# Patient Record
Sex: Female | Born: 1945 | Race: White | Hispanic: No | Smoking: Never smoker
Health system: Southern US, Community
[De-identification: ages and names within clinical notes are randomized; demographics above are authoritative.]

## PROBLEM LIST (undated history)

## (undated) DIAGNOSIS — K2971 Gastritis, unspecified, with bleeding: Secondary | ICD-10-CM

## (undated) DIAGNOSIS — M1990 Unspecified osteoarthritis, unspecified site: Secondary | ICD-10-CM

## (undated) DIAGNOSIS — M810 Age-related osteoporosis without current pathological fracture: Secondary | ICD-10-CM

## (undated) DIAGNOSIS — Z9049 Acquired absence of other specified parts of digestive tract: Secondary | ICD-10-CM

## (undated) DIAGNOSIS — G8929 Other chronic pain: Secondary | ICD-10-CM

## (undated) DIAGNOSIS — D8489 Other immunodeficiencies: Secondary | ICD-10-CM

## (undated) DIAGNOSIS — M064 Inflammatory polyarthropathy: Secondary | ICD-10-CM

## (undated) DIAGNOSIS — K219 Gastro-esophageal reflux disease without esophagitis: Secondary | ICD-10-CM

## (undated) DIAGNOSIS — G63 Polyneuropathy in diseases classified elsewhere: Secondary | ICD-10-CM

## (undated) DIAGNOSIS — I509 Heart failure, unspecified: Secondary | ICD-10-CM

## (undated) DIAGNOSIS — E119 Type 2 diabetes mellitus without complications: Secondary | ICD-10-CM

## (undated) DIAGNOSIS — M48062 Spinal stenosis, lumbar region with neurogenic claudication: Secondary | ICD-10-CM

## (undated) DIAGNOSIS — E1169 Type 2 diabetes mellitus with other specified complication: Secondary | ICD-10-CM

## (undated) DIAGNOSIS — E782 Mixed hyperlipidemia: Secondary | ICD-10-CM

## (undated) DIAGNOSIS — F32A Depression, unspecified: Secondary | ICD-10-CM

## (undated) DIAGNOSIS — Z933 Colostomy status: Secondary | ICD-10-CM

## (undated) DIAGNOSIS — D500 Iron deficiency anemia secondary to blood loss (chronic): Secondary | ICD-10-CM

## (undated) DIAGNOSIS — D649 Anemia, unspecified: Secondary | ICD-10-CM

## (undated) HISTORY — PX: CHOLECYSTECTOMY: SHX55

## (undated) HISTORY — PX: ABDOMINAL HYSTERECTOMY: SHX81

## (undated) HISTORY — PX: TUMOR REMOVAL: SHX12

---

## 2001-01-31 ENCOUNTER — Encounter: Admission: RE | Admit: 2001-01-31 | Discharge: 2001-01-31 | Payer: Self-pay | Admitting: Urology

## 2001-01-31 ENCOUNTER — Encounter: Payer: Self-pay | Admitting: Urology

## 2001-02-17 ENCOUNTER — Other Ambulatory Visit: Admission: RE | Admit: 2001-02-17 | Discharge: 2001-02-17 | Payer: Self-pay | Admitting: Obstetrics and Gynecology

## 2001-02-24 ENCOUNTER — Ambulatory Visit (HOSPITAL_COMMUNITY): Admission: RE | Admit: 2001-02-24 | Discharge: 2001-02-24 | Payer: Self-pay | Admitting: Obstetrics and Gynecology

## 2001-02-24 ENCOUNTER — Encounter: Payer: Self-pay | Admitting: Obstetrics and Gynecology

## 2001-03-15 ENCOUNTER — Inpatient Hospital Stay (HOSPITAL_COMMUNITY): Admission: RE | Admit: 2001-03-15 | Discharge: 2001-03-18 | Payer: Self-pay | Admitting: Urology

## 2002-05-29 ENCOUNTER — Other Ambulatory Visit: Admission: RE | Admit: 2002-05-29 | Discharge: 2002-05-29 | Payer: Self-pay | Admitting: Obstetrics and Gynecology

## 2004-08-13 ENCOUNTER — Other Ambulatory Visit: Admission: RE | Admit: 2004-08-13 | Discharge: 2004-08-13 | Payer: Self-pay | Admitting: Obstetrics and Gynecology

## 2009-11-09 ENCOUNTER — Ambulatory Visit: Payer: Self-pay | Admitting: Internal Medicine

## 2011-01-22 ENCOUNTER — Emergency Department: Payer: Self-pay | Admitting: Emergency Medicine

## 2011-05-04 ENCOUNTER — Ambulatory Visit: Payer: Self-pay | Admitting: Family Medicine

## 2011-05-25 ENCOUNTER — Emergency Department (HOSPITAL_COMMUNITY)
Admission: EM | Admit: 2011-05-25 | Discharge: 2011-05-25 | Disposition: A | Discharge status: Home / Self Care | Payer: Medicare Other | Attending: Emergency Medicine | Admitting: Emergency Medicine

## 2011-05-25 ENCOUNTER — Emergency Department (HOSPITAL_COMMUNITY): Payer: Medicare Other

## 2011-05-25 ENCOUNTER — Encounter: Payer: Self-pay | Admitting: Emergency Medicine

## 2011-05-25 DIAGNOSIS — M5430 Sciatica, unspecified side: Secondary | ICD-10-CM

## 2011-05-25 DIAGNOSIS — M79606 Pain in leg, unspecified: Secondary | ICD-10-CM

## 2011-05-25 DIAGNOSIS — R209 Unspecified disturbances of skin sensation: Secondary | ICD-10-CM | POA: Insufficient documentation

## 2011-05-25 DIAGNOSIS — M79609 Pain in unspecified limb: Secondary | ICD-10-CM | POA: Insufficient documentation

## 2011-05-25 DIAGNOSIS — M549 Dorsalgia, unspecified: Secondary | ICD-10-CM | POA: Insufficient documentation

## 2011-05-25 DIAGNOSIS — K219 Gastro-esophageal reflux disease without esophagitis: Secondary | ICD-10-CM | POA: Insufficient documentation

## 2011-05-25 DIAGNOSIS — M25559 Pain in unspecified hip: Secondary | ICD-10-CM | POA: Insufficient documentation

## 2011-05-25 DIAGNOSIS — R29898 Other symptoms and signs involving the musculoskeletal system: Secondary | ICD-10-CM | POA: Insufficient documentation

## 2011-05-25 DIAGNOSIS — I509 Heart failure, unspecified: Secondary | ICD-10-CM | POA: Insufficient documentation

## 2011-05-25 HISTORY — DX: Unspecified osteoarthritis, unspecified site: M19.90

## 2011-05-25 HISTORY — DX: Heart failure, unspecified: I50.9

## 2011-05-25 HISTORY — DX: Gastro-esophageal reflux disease without esophagitis: K21.9

## 2011-05-25 MED ORDER — OXYCODONE-ACETAMINOPHEN 5-325 MG PO TABS: 1.0000 | ORAL_TABLET | Freq: Four times a day (QID) | ORAL | Status: AC | PRN

## 2011-05-25 MED ORDER — OXYCODONE-ACETAMINOPHEN 5-325 MG PO TABS
1.0000 | ORAL_TABLET | Freq: Once | ORAL | Status: AC
  Administered 2011-05-25: 1 via ORAL
  Filled 2011-05-25: qty 1

## 2011-05-25 NOTE — ED Provider Notes (Signed)
History     CSN: [id number] Arrival date & time: 05/25/2011  2:37 PM   First MD Initiated Contact with Patient 05/25/11 1438      Chief Complaint  Patient presents with  . Hip Pain     HPI  65yoF pw LLE pain. She is complaining of intermittent pain in her left leg, currently 6/10. No relief with vicodin. She describes the pain as radiating from her back down to her knee. The pain is intermittently worse. Including worse with walking, sitting. She denies numbness,  Tingling, weakness of her leg. She is ambulatory. She states she did fall approximately 3 weeks ago 2 days prior to symptom onset. She was seen by her primary care Dr. negative x-rays of her lumbar spine. She presents today for worsening pain. She denies pain in her hip. She denies fever, chills. Denies abdominal pain, nausea, vomiting, diaphoresis, constipation, diarrhea.  Denies h/o malignancy, DM, immunocompromise  injection drug use, immunosuppression, indwelling urinary catheter, prolonged steroid use, skin or urinary tract infection. Denies saddle anesthesia, no urinary incontinence or retention.     Past Medical History  Diagnosis Date  . Arthritis   . CHF (congestive heart failure)   . GERD (gastroesophageal reflux disease)     History reviewed. No pertinent past surgical history.  History reviewed. No pertinent family history.  History  Substance Use Topics  . Smoking status: Not on file  . Smokeless tobacco: Not on file  . Alcohol Use:     OB History    Grav Para Term Preterm Abortions TAB SAB Ect Mult Living                  Review of Systems  All other systems reviewed and are negative.   except as noted HPI  Allergies  Levaquin  Home Medications   Current Outpatient Rx  Name Route Sig Dispense Refill  . CALCIUM 600 + D PO Oral Take 1 tablet by mouth 2 (two) times daily.      Marland Kitchen VITAMIN B-12 PO Oral Take 1 tablet by mouth daily.      . CYCLOBENZAPRINE HCL 5 MG PO TABS Oral Take 5 mg by  mouth daily.      . OMEGA-3 FATTY ACIDS 1000 MG PO CAPS Oral Take 2 g by mouth 2 (two) times daily.      Marland Kitchen LOVASTATIN 20 MG PO TABS Oral Take 20 mg by mouth at bedtime.      . MELOXICAM 15 MG PO TABS Oral Take 15 mg by mouth daily.      Marland Kitchen OMEPRAZOLE 20 MG PO CPDR Oral Take 20 mg by mouth daily.      Marland Kitchen OVER THE COUNTER MEDICATION Oral Take 1 tablet by mouth daily. Nutri-Calm vitamin     . SERTRALINE HCL 100 MG PO TABS Oral Take 100 mg by mouth daily.      . TORSEMIDE 20 MG PO TABS Oral Take 20 mg by mouth daily.      . OXYCODONE-ACETAMINOPHEN 5-325 MG PO TABS Oral Take 1 tablet by mouth every 6 (six) hours as needed for pain. 15 tablet 0    BP 110/59  Pulse 86  Temp(Src) 99.1 F (37.3 C) (Oral)  Resp 18  SpO2 95%  Physical Exam  Nursing note and vitals reviewed. Constitutional: She is oriented to person, place, and time. She appears well-developed.  HENT:  Head: Atraumatic.  Mouth/Throat: Oropharynx is clear and moist.  Eyes: Conjunctivae and EOM are normal. Pupils  are equal, round, and reactive to light.  Neck: Normal range of motion. Neck supple.  Cardiovascular: Normal rate, regular rhythm, normal heart sounds and intact distal pulses.   Pulmonary/Chest: Effort normal and breath sounds normal. No respiratory distress. She has no wheezes. She has no rales.  Abdominal: Soft. She exhibits no distension. There is no tenderness. There is no rebound and no guarding.  Musculoskeletal: Normal range of motion.       Lt leg raise positive no tenderness to palpation midline cervical thoracic or lumbar spine Strength 5/5 bilateral lower extr Minimal pain internal and external rotation left hip Sensation intact Refill less than 2 seconds  Neurological: She is alert and oriented to person, place, and time. She exhibits normal muscle tone.       No saddle anesthesia  Skin: Skin is warm and dry. No rash noted.  Psychiatric: She has a normal mood and affect.    ED Course  Procedures  (including critical care time)  Labs Reviewed - No data to display Dg Lumbar Spine Complete  05/25/2011  *RADIOLOGY REPORT*  Clinical Data: Fall.  LUMBAR SPINE - COMPLETE 4+ VIEW  Comparison: None.  Findings: There is mild rightward scoliosis.  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  Degenerative facet disease in the lower lumbar spine.  No fracture or acute subluxation.  SI joints are symmetric and unremarkable.  IMPRESSION: Scoliosis, spondylosis.  No acute findings.  Original Report Authenticated By: Cyndie Chime, M.D.   Dg Hip Complete Left  05/25/2011  *RADIOLOGY REPORT*  Clinical Data: Fall, left hip pain.  LEFT HIP - COMPLETE 2+ VIEW  Comparison: None  Findings: No acute bony abnormality.  Specifically, no fracture, subluxation, or dislocation.  Soft tissues are intact.  Hip joints and SI joints are symmetric and unremarkable.  IMPRESSION: No acute bony abnormality.  Original Report Authenticated By: Cyndie Chime, M.D.     1. Sciatica   2. Leg pain       MDM  Likely sciatica. neurovasc intact. XR lumbar spine with degenerative changes. X-ray of hip is unremarkable. She is invalid worry in emergency department. Will prescribe Vicodin as needed for breakthrough pain, patient to follow with her primary care doctor within the next one week if symptoms persist. Given strict precautions for return. Discussed with patient and husband. They are comfortable with discharge.        Forbes Cellar, MD 05/25/11 [phone]

## 2011-05-25 NOTE — ED Notes (Signed)
Pt ambulated in hallway, and reports that she would rather buy cane from store

## 2011-05-25 NOTE — ED Notes (Signed)
Pt to ED for eval of L hip pain; pt reports that about 3 weeks ago she hurt her L hip trying to feed dog; pt reports that the pain has not gone away and continues to get worse

## 2011-05-25 NOTE — ED Notes (Signed)
Ortho tech paged  

## 2011-05-25 NOTE — ED Notes (Signed)
Hip to knee hurting x several weeks went to see her dr x 3 weeks ago for same pain  Not any better denies dysuria pain feels deep

## 2012-03-15 ENCOUNTER — Ambulatory Visit: Payer: Self-pay | Admitting: Internal Medicine

## 2012-05-13 ENCOUNTER — Ambulatory Visit: Payer: Self-pay | Admitting: Unknown Physician Specialty

## 2012-06-08 HISTORY — PX: BREAST BIOPSY: SHX20

## 2012-11-25 ENCOUNTER — Other Ambulatory Visit: Payer: Self-pay | Admitting: Family Medicine

## 2013-03-09 ENCOUNTER — Ambulatory Visit: Payer: Self-pay | Admitting: Unknown Physician Specialty

## 2013-03-16 ENCOUNTER — Ambulatory Visit: Payer: Self-pay | Admitting: Internal Medicine

## 2013-03-28 ENCOUNTER — Ambulatory Visit: Payer: Self-pay | Admitting: Internal Medicine

## 2013-04-17 ENCOUNTER — Ambulatory Visit: Payer: Self-pay | Admitting: Surgery

## 2013-11-23 ENCOUNTER — Other Ambulatory Visit (HOSPITAL_COMMUNITY): Payer: Self-pay | Admitting: Internal Medicine

## 2013-11-23 ENCOUNTER — Telehealth (HOSPITAL_COMMUNITY): Payer: Self-pay | Admitting: *Deleted

## 2013-11-23 DIAGNOSIS — I739 Peripheral vascular disease, unspecified: Secondary | ICD-10-CM

## 2013-11-24 ENCOUNTER — Ambulatory Visit (HOSPITAL_COMMUNITY)
Admission: RE | Admit: 2013-11-24 | Discharge: 2013-11-24 | Disposition: A | Discharge status: Home / Self Care | Payer: Medicare Other | Source: Ambulatory Visit | Attending: Cardiovascular Disease | Admitting: Cardiovascular Disease

## 2013-11-24 DIAGNOSIS — I739 Peripheral vascular disease, unspecified: Secondary | ICD-10-CM | POA: Insufficient documentation

## 2013-11-24 DIAGNOSIS — I70219 Atherosclerosis of native arteries of extremities with intermittent claudication, unspecified extremity: Secondary | ICD-10-CM

## 2013-11-24 NOTE — Progress Notes (Signed)
Arterial Duplex Lower Ext. Completed. Rita Sturdivant, BS, RDMS, RVT  

## 2013-11-30 ENCOUNTER — Other Ambulatory Visit: Payer: Self-pay | Admitting: Physical Medicine and Rehabilitation

## 2013-11-30 DIAGNOSIS — IMO0002 Reserved for concepts with insufficient information to code with codable children: Secondary | ICD-10-CM

## 2013-11-30 DIAGNOSIS — M48062 Spinal stenosis, lumbar region with neurogenic claudication: Secondary | ICD-10-CM

## 2013-12-05 ENCOUNTER — Ambulatory Visit
Admission: RE | Admit: 2013-12-05 | Discharge: 2013-12-05 | Disposition: A | Discharge status: Home / Self Care | Payer: Medicare Other | Source: Ambulatory Visit | Attending: Physical Medicine and Rehabilitation | Admitting: Physical Medicine and Rehabilitation

## 2013-12-05 DIAGNOSIS — IMO0002 Reserved for concepts with insufficient information to code with codable children: Secondary | ICD-10-CM

## 2013-12-05 DIAGNOSIS — M48062 Spinal stenosis, lumbar region with neurogenic claudication: Secondary | ICD-10-CM

## 2013-12-19 ENCOUNTER — Ambulatory Visit: Payer: Medicare Other | Admitting: Cardiovascular Disease

## 2014-04-02 ENCOUNTER — Ambulatory Visit: Payer: Self-pay | Admitting: Internal Medicine

## 2015-03-29 ENCOUNTER — Other Ambulatory Visit: Payer: Self-pay | Admitting: Internal Medicine

## 2015-03-29 DIAGNOSIS — Z1231 Encounter for screening mammogram for malignant neoplasm of breast: Secondary | ICD-10-CM

## 2015-04-05 ENCOUNTER — Other Ambulatory Visit: Payer: Self-pay | Admitting: Internal Medicine

## 2015-04-05 ENCOUNTER — Ambulatory Visit
Admission: RE | Admit: 2015-04-05 | Discharge: 2015-04-05 | Disposition: A | Discharge status: Home / Self Care | Payer: Medicare Other | Source: Ambulatory Visit | Attending: Internal Medicine | Admitting: Internal Medicine

## 2015-04-05 DIAGNOSIS — Z1231 Encounter for screening mammogram for malignant neoplasm of breast: Secondary | ICD-10-CM

## 2015-09-30 ENCOUNTER — Encounter: Payer: Self-pay | Admitting: Emergency Medicine

## 2015-09-30 ENCOUNTER — Emergency Department
Admission: EM | Admit: 2015-09-30 | Discharge: 2015-09-30 | Disposition: A | Discharge status: Home / Self Care | Payer: Medicare Other | Attending: Emergency Medicine | Admitting: Emergency Medicine

## 2015-09-30 ENCOUNTER — Emergency Department: Payer: Medicare Other

## 2015-09-30 DIAGNOSIS — D649 Anemia, unspecified: Secondary | ICD-10-CM | POA: Insufficient documentation

## 2015-09-30 DIAGNOSIS — I509 Heart failure, unspecified: Secondary | ICD-10-CM | POA: Insufficient documentation

## 2015-09-30 DIAGNOSIS — Z79899 Other long term (current) drug therapy: Secondary | ICD-10-CM | POA: Insufficient documentation

## 2015-09-30 DIAGNOSIS — R531 Weakness: Secondary | ICD-10-CM | POA: Diagnosis not present

## 2015-09-30 DIAGNOSIS — M1990 Unspecified osteoarthritis, unspecified site: Secondary | ICD-10-CM | POA: Diagnosis not present

## 2015-09-30 LAB — URINALYSIS COMPLETE WITH MICROSCOPIC (ARMC ONLY)
BILIRUBIN URINE: NEGATIVE
Bacteria, UA: NONE SEEN
Glucose, UA: NEGATIVE mg/dL
Hgb urine dipstick: NEGATIVE
Ketones, ur: NEGATIVE mg/dL
LEUKOCYTES UA: NEGATIVE
NITRITE: NEGATIVE
Protein, ur: NEGATIVE mg/dL
SPECIFIC GRAVITY, URINE: 1.009 (ref 1.005–1.030)
pH: 5 (ref 5.0–8.0)

## 2015-09-30 LAB — COMPREHENSIVE METABOLIC PANEL WITH GFR
ALT: 12 U/L — ABNORMAL LOW (ref 14–54)
AST: 18 U/L (ref 15–41)
Albumin: 4.3 g/dL (ref 3.5–5.0)
Alkaline Phosphatase: 50 U/L (ref 38–126)
Anion gap: 15 (ref 5–15)
BUN: 21 mg/dL — ABNORMAL HIGH (ref 6–20)
CO2: 25 mmol/L (ref 22–32)
Calcium: 9.6 mg/dL (ref 8.9–10.3)
Chloride: 100 mmol/L — ABNORMAL LOW (ref 101–111)
Creatinine, Ser: 1.06 mg/dL — ABNORMAL HIGH (ref 0.44–1.00)
GFR calc Af Amer: 59 mL/min — ABNORMAL LOW
GFR calc non Af Amer: 51 mL/min — ABNORMAL LOW
Glucose, Bld: 105 mg/dL — ABNORMAL HIGH (ref 65–99)
Potassium: 3.2 mmol/L — ABNORMAL LOW (ref 3.5–5.1)
Sodium: 140 mmol/L (ref 135–145)
Total Bilirubin: 0.3 mg/dL (ref 0.3–1.2)
Total Protein: 7.8 g/dL (ref 6.5–8.1)

## 2015-09-30 LAB — CBC
HCT: 26.1 % — ABNORMAL LOW (ref 35.0–47.0)
Hemoglobin: 7.9 g/dL — ABNORMAL LOW (ref 12.0–16.0)
MCH: 24.2 pg — ABNORMAL LOW (ref 26.0–34.0)
MCHC: 30.1 g/dL — ABNORMAL LOW (ref 32.0–36.0)
MCV: 80.2 fL (ref 80.0–100.0)
Platelets: 423 K/uL (ref 150–440)
RBC: 3.26 MIL/uL — ABNORMAL LOW (ref 3.80–5.20)
RDW: 20.1 % — ABNORMAL HIGH (ref 11.5–14.5)
WBC: 9.3 K/uL (ref 3.6–11.0)

## 2015-09-30 LAB — TYPE AND SCREEN
ABO/RH(D): A POS
ANTIBODY SCREEN: NEGATIVE

## 2015-09-30 LAB — TROPONIN I

## 2015-09-30 LAB — ABO/RH: ABO/RH(D): A POS

## 2015-09-30 LAB — LIPASE, BLOOD: LIPASE: 25 U/L (ref 11–51)

## 2015-09-30 LAB — TSH: TSH: 5.871 u[IU]/mL — ABNORMAL HIGH (ref 0.350–4.500)

## 2015-09-30 NOTE — ED Provider Notes (Signed)
[facility] Emergency Department Provider Note  ____________________________________________  Time seen: Approximately230 PM  I have reviewed the triage vital signs and the nursing notes.   HISTORY  Chief Complaint Abnormal Lab   HPI Vanessa Cisneros is a 70 y.o. female with a history of CHF as well as anemia who is presenting to the emergency department with weakness as well as intermittent upper abdominal pain. She said that she has been anemic lately with a hemoglobin down to 6.5 several weeks ago. She has been double on her iron dose and is now feeling increasingly weak with nausea chills and mild headaches. She says that she also has dyspnea upon exertion. She is also complaining of upper abdominal cramping which comes and goes after she had a vomiting episode Easter services. She said that the [employer] services vomitus appeared as "old blood." She has not had any episodes of vomiting since. She denies any chest pain. Says that she does have dark stool but says this is chronic and unchanged from her iron.   Past Medical History  Diagnosis Date  . Arthritis   . CHF (congestive heart failure) (HCC)   . GERD (gastroesophageal reflux disease)     There are no active problems to display for this patient.   Past Surgical History  Procedure Laterality Date  . Breast biopsy Left 2014    neg  . Abdominal hysterectomy    Cholecystectomy  Current Outpatient Rx  Name  Route  Sig  Dispense  Refill  . Calcium Carbonate-Vitamin D (CALCIUM 600 + D PO)   Oral   Take 1 tablet by mouth 2 (two) times daily.           . Cyanocobalamin (VITAMIN B-12 PO)   Oral   Take 1 tablet by mouth daily.           . cyclobenzaprine (FLEXERIL) 5 MG tablet   Oral   Take 5 mg by mouth daily.           . fish oil-omega-3 fatty acids 1000 MG capsule   Oral   Take 2 g by mouth 2 (two) times daily.           Marland Kitchen lovastatin (MEVACOR) 20 MG tablet   Oral   Take 20 mg by  mouth at bedtime.           . meloxicam (MOBIC) 15 MG tablet   Oral   Take 15 mg by mouth daily.           Marland Kitchen omeprazole (PRILOSEC) 20 MG capsule   Oral   Take 20 mg by mouth daily.           Marland Kitchen OVER THE COUNTER MEDICATION   Oral   Take 1 tablet by mouth daily. Nutri-Calm vitamin          . sertraline (ZOLOFT) 100 MG tablet   Oral   Take 100 mg by mouth daily.           Marland Kitchen torsemide (DEMADEX) 20 MG tablet   Oral   Take 20 mg by mouth daily.             Allergies Levofloxacin  History reviewed. No pertinent family history.  Social History Social History  Substance Use Topics  . Smoking status: Never Smoker   . Smokeless tobacco: None  . Alcohol Use: No    Review of Systems Constitutional: No fever/chills Eyes: No visual changes. ENT: No sore throat. Cardiovascular: Denies chest  pain. Respiratory:As above Gastrointestinal:  no vomiting.  No diarrhea.  No constipation. Genitourinary: Negative for dysuria. Musculoskeletal: Negative for back pain. Skin: Negative for rash. Neurological: Negative for focal weakness or numbness. Denying any headache at this time.  10-point ROS otherwise negative.  ____________________________________________   PHYSICAL EXAM:  VITAL SIGNS: ED Triage Vitals  Enc Vitals Group     BP 09/30/15 1059 147/60 mmHg     Pulse Rate 09/30/15 1059 83     Resp 09/30/15 1059 18     Temp 09/30/15 1059 98.4 F (36.9 C)     Temp Source 09/30/15 1059 Oral     SpO2 09/30/15 1059 100 %     Weight 09/30/15 1059 105 lb (47.628 kg)     Height 09/30/15 1059  (1.549 m)     Head Cir --      Peak Flow --      Pain Score 09/30/15 1100 1     Pain Loc --      Pain Edu? --      Excl. in GC? --     Constitutional: Alert and oriented. Well appearing and in no acute distress. Eyes: Conjunctivae are normal. PERRL. EOMI. Head: Atraumatic. Nose: No congestion/rhinnorhea. Mouth/Throat: Mucous membranes are moist.   Neck: No stridor.    Cardiovascular: Normal rate, regular rhythm. Grossly normal heart sounds.  Good peripheral circulation. Respiratory: Normal respiratory effort.  No retractions. Lungs CTAB. Gastrointestinal: Soft with mild upper abdominal tenderness palpation. No rebound or guarding. Negative Murphy sign.. No distention. No abdominal bruits. No CVA tenderness. Rectal exam with green stool which is weakly heme positive. Musculoskeletal: No lower extremity tenderness nor edema.  No joint effusions. Neurologic:  Normal speech and language. No gross focal neurologic deficits are appreciated. No gait instability. Skin:  Skin is warm, dry and intact. No rash noted. Psychiatric: Mood and affect are normal. Speech and behavior are normal.  ____________________________________________   LABS (all labs ordered are listed, but only abnormal results are displayed)  Labs Reviewed  COMPREHENSIVE METABOLIC PANEL - Abnormal; Notable for the following:    Potassium 3.2 (*)    Chloride 100 (*)    Glucose, Bld 105 (*)    BUN 21 (*)    Creatinine, Ser 1.06 (*)    ALT 12 (*)    GFR calc non Af Amer 51 (*)    GFR calc Af Amer 59 (*)    All other components within normal limits  CBC - Abnormal; Notable for the following:    RBC 3.26 (*)    Hemoglobin 7.9 (*)    HCT 26.1 (*)    MCH 24.2 (*)    MCHC 30.1 (*)    RDW 20.1 (*)    All other components within normal limits  URINALYSIS COMPLETEWITH MICROSCOPIC ([facility] ONLY) - Abnormal; Notable for the following:    Color, Urine YELLOW (*)    APPearance CLEAR (*)    Squamous Epithelial / LPF 0-5 (*)    All other components within normal limits  TSH - Abnormal; Notable for the following:    TSH 5.871 (*)    All other components within normal limits  LIPASE, BLOOD  TROPONIN I  TYPE AND SCREEN  ABO/RH   ____________________________________________  EKG  ED ECG REPORT I, Arelia Longest, the attending physician, personally viewed and interpreted this ECG.    Date: 09/30/2015  EKG Time: 1501  Rate: Normal sinus  Rhythm: normal sinus rhythm  Axis: Normal  Intervals:none  ST&T Change: No ST segment elevation or depression. No abnormal T-wave inversion.   ____________________________________________  RADIOLOGY  DG Chest 2 View (Final result) Result time: 09/30/15 14:37:25   Final result by Rad Results In Interface (09/30/15 14:37:25)   Narrative:   CLINICAL DATA: Weakness starting Sunday  EXAM: CHEST 2 VIEW  COMPARISON: Report 02/24/2001 no images available  FINDINGS: Cardiomediastinal silhouette is unremarkable. No acute infiltrate or pleural effusion. No pulmonary edema. Mild left basilar atelectasis or scarring. Moderate size hiatal hernia measures 7.5 cm.  IMPRESSION: No active disease. Moderate size hiatal hernia. Mild left basilar atelectasis or scarring.   Electronically Signed By: Natasha MeadLiviu Pop M.D. On: 09/30/2015 14:37        ____________________________________________   PROCEDURES    ____________________________________________   INITIAL IMPRESSION / ASSESSMENT AND PLAN / ED COURSE  Pertinent labs & imaging results that were available during my care of the patient were reviewed by me and considered in my medical decision making (see chart for details).  ----------------------------------------- 4:13 PM on 09/30/2015 -----------------------------------------  Hemoglobin has increased to 7.9 today from 6.5. Does not appear to have any active source of bleeding. I would say that her rectal exam is more consistent with the patient's iron usage and acute GI bleed. She has not had any further episodes of nausea and vomiting and she says she is "on an acid reducer." I discussed with her following up with Dr. Hyacinth MeekerMiller in the office for repeat hemoglobin in 1-2 days and she is understanding of this plan and willing to comply. He was also able to ambulate with a normal gait without any assistance although  she says that she feels weak all over when she is getting up to walk. ____________________________________________   FINAL CLINICAL IMPRESSION(S) / ED DIAGNOSES  Weakness. Anemia.    Myrna Blazeravid Matthew Schaevitz, MD 09/30/15 [phone]

## 2015-09-30 NOTE — Discharge Instructions (Signed)
Anemia, Nonspecific Anemia is a condition in which the concentration of red blood cells or hemoglobin in the blood is below normal. Hemoglobin is a substance in red blood cells that carries oxygen to the tissues of the body. Anemia results in not enough oxygen reaching these tissues.  CAUSES  Common causes of anemia include:   Excessive bleeding. Bleeding may be internal or external. This includes excessive bleeding from periods (in women) or from the intestine.   Poor nutrition.   Chronic kidney, thyroid, and liver disease.  Bone marrow disorders that decrease red blood cell production.  Cancer and treatments for cancer.  HIV, AIDS, and their treatments.  Spleen problems that increase red blood cell destruction.  Blood disorders.  Excess destruction of red blood cells due to infection, medicines, and autoimmune disorders. SIGNS AND SYMPTOMS   Minor weakness.   Dizziness.   Headache.  Palpitations.   Shortness of breath, especially with exercise.   Paleness.  Cold sensitivity.  Indigestion.  Nausea.  Difficulty sleeping.  Difficulty concentrating. Symptoms may occur suddenly or they may develop slowly.  DIAGNOSIS  Additional blood tests are often needed. These help your health care provider determine the best treatment. Your health care provider will check your stool for blood and look for other causes of blood loss.  TREATMENT  Treatment varies depending on the cause of the anemia. Treatment can include:   Supplements of iron, vitamin B12, or folic acid.   Hormone medicines.   A blood transfusion. This may be needed if blood loss is severe.   Hospitalization. This may be needed if there is significant continual blood loss.   Dietary changes.  Spleen removal. HOME CARE INSTRUCTIONS Keep all follow-up appointments. It often takes many weeks to correct anemia, and having your health care provider check on your condition and your response to  treatment is very important. SEEK IMMEDIATE MEDICAL CARE IF:   You develop extreme weakness, shortness of breath, or chest pain.   You become dizzy or have trouble concentrating.  You develop heavy vaginal bleeding.   You develop a rash.   You have bloody or black, tarry stools.   You faint.   You vomit up blood.   You vomit repeatedly.   You have abdominal pain.  You have a fever or persistent symptoms for more than 2-3 days.   You have a fever and your symptoms suddenly get worse.   You are dehydrated.  MAKE SURE YOU:  Understand these instructions.  Will watch your condition.  Will get help right away if you are not doing well or get worse.   This information is not intended to replace advice given to you by your health care provider. Make sure you discuss any questions you have with your health care provider.   Document Released: 07/02/2004 Document Revised: 01/25/2013 Document Reviewed: 11/18/2012 Elsevier Interactive Patient Education 2016 [employer]  

## 2015-09-30 NOTE — ED Notes (Addendum)
Pt to Xray.

## 2015-09-30 NOTE — ED Notes (Signed)
Pt from home with weakness, nausea, chills, headache, dyspnea upon exertion. Pt was seen last week in Dr. Rondel BatonMiller's office and told her hemoglobin was 6.5. States she wanted to be seen again today but they didn't call her back until she was already here. Pt alert & oriented with NAD noted.

## 2015-10-11 ENCOUNTER — Encounter: Payer: Self-pay | Admitting: *Deleted

## 2015-10-14 ENCOUNTER — Ambulatory Visit: Payer: Medicare Other | Admitting: Anesthesiology

## 2015-10-14 ENCOUNTER — Encounter
Admission: RE | Disposition: A | Discharge status: Home / Self Care | Payer: Self-pay | Source: Ambulatory Visit | Attending: Unknown Physician Specialty

## 2015-10-14 ENCOUNTER — Ambulatory Visit
Admission: RE | Admit: 2015-10-14 | Discharge: 2015-10-14 | Disposition: A | Discharge status: Home / Self Care | Payer: Medicare Other | Source: Ambulatory Visit | Attending: Unknown Physician Specialty | Admitting: Unknown Physician Specialty

## 2015-10-14 ENCOUNTER — Encounter: Payer: Self-pay | Admitting: *Deleted

## 2015-10-14 DIAGNOSIS — K219 Gastro-esophageal reflux disease without esophagitis: Secondary | ICD-10-CM | POA: Diagnosis not present

## 2015-10-14 DIAGNOSIS — I509 Heart failure, unspecified: Secondary | ICD-10-CM | POA: Insufficient documentation

## 2015-10-14 DIAGNOSIS — Z79899 Other long term (current) drug therapy: Secondary | ICD-10-CM | POA: Diagnosis not present

## 2015-10-14 DIAGNOSIS — D509 Iron deficiency anemia, unspecified: Secondary | ICD-10-CM | POA: Insufficient documentation

## 2015-10-14 DIAGNOSIS — M1991 Primary osteoarthritis, unspecified site: Secondary | ICD-10-CM | POA: Insufficient documentation

## 2015-10-14 DIAGNOSIS — K319 Disease of stomach and duodenum, unspecified: Secondary | ICD-10-CM | POA: Insufficient documentation

## 2015-10-14 HISTORY — PX: ESOPHAGOGASTRODUODENOSCOPY (EGD) WITH PROPOFOL: SHX5813

## 2015-10-14 SURGERY — ESOPHAGOGASTRODUODENOSCOPY (EGD) WITH PROPOFOL
Anesthesia: General

## 2015-10-14 MED ORDER — PROPOFOL 500 MG/50ML IV EMUL
INTRAVENOUS | Status: DC | PRN
  Administered 2015-10-14: 100 ug/kg/min via INTRAVENOUS

## 2015-10-14 MED ORDER — LIDOCAINE HCL (CARDIAC) 20 MG/ML IV SOLN
INTRAVENOUS | Status: DC | PRN
  Administered 2015-10-14: 30 mg via INTRATRACHEAL

## 2015-10-14 MED ORDER — SODIUM CHLORIDE 0.9 % IV SOLN: INTRAVENOUS | Status: DC

## 2015-10-14 MED ORDER — PROPOFOL 10 MG/ML IV BOLUS
INTRAVENOUS | Status: DC | PRN
  Administered 2015-10-14: 20 mg via INTRAVENOUS
  Administered 2015-10-14: 10 mg via INTRAVENOUS

## 2015-10-14 MED ORDER — SODIUM CHLORIDE 0.9 % IV SOLN
INTRAVENOUS | Status: DC
  Administered 2015-10-14: 1000 mL via INTRAVENOUS

## 2015-10-14 NOTE — H&P (Signed)
Primary Care Physician:  Danella PentonMark F Miller, MD Primary Gastroenterologist:  Dr. Mechele CollinElliott  Pre-Procedure History & Physical: HPI:  Vanessa Cisneros is a 70 y.o. female is here for an endoscopy.   Past Medical History  Diagnosis Date  . Arthritis   . CHF (congestive heart failure) (HCC)   . GERD (gastroesophageal reflux disease)     Past Surgical History  Procedure Laterality Date  . Breast biopsy Left 2014    neg  . Abdominal hysterectomy    . Tumor removal N/A     stomach    Prior to Admission medications   Medication Sig Start Date End Date Taking? Authorizing Provider  Calcium Carbonate-Vitamin D (CALCIUM 600 + D PO) Take 1 tablet by mouth 2 (two) times daily.     Yes Historical Provider, MD  fish oil-omega-3 fatty acids 1000 MG capsule Take 2 g by mouth 2 (two) times daily.     Yes Historical Provider, MD  lovastatin (MEVACOR) 20 MG tablet Take 20 mg by mouth at bedtime.     Yes Historical Provider, MD  omeprazole (PRILOSEC) 20 MG capsule Take 20 mg by mouth daily.     Yes Historical Provider, MD  sertraline (ZOLOFT) 100 MG tablet Take 100 mg by mouth daily.     Yes Historical Provider, MD  torsemide (DEMADEX) 20 MG tablet Take 20 mg by mouth daily.     Yes Historical Provider, MD  Cyanocobalamin (VITAMIN B-12 PO) Take 1 tablet by mouth daily. Reported on 10/14/2015    Historical Provider, MD  cyclobenzaprine (FLEXERIL) 5 MG tablet Take 5 mg by mouth daily. Reported on 10/14/2015    Historical Provider, MD  meloxicam (MOBIC) 15 MG tablet Take 15 mg by mouth daily. Reported on 10/14/2015    Historical Provider, MD  OVER THE COUNTER MEDICATION Take 1 tablet by mouth daily. Nutri-Calm vitamin     Historical Provider, MD    Allergies as of 10/11/2015 - Review Complete 10/11/2015  Allergen Reaction Noted  . Levofloxacin  05/25/2011    History reviewed. No pertinent family history.  Social History   Social History  . Marital Status: Married    Spouse Name: N/A  . Number of  Children: N/A  . Years of Education: N/A   Occupational History  . Not on file.   Social History Main Topics  . Smoking status: Never Smoker   . Smokeless tobacco: Not on file  . Alcohol Use: No  . Drug Use: Not on file  . Sexual Activity: Not on file   Other Topics Concern  . Not on file   Social History Narrative    Review of Systems: See HPI, otherwise negative ROS  Physical Exam: BP 121/42 mmHg  Pulse 75  Temp(Src) 97.7 F (36.5 C) (Tympanic)  Resp 16  Ht 5\' 1"  (1.549 m)  Wt 47.628 kg (105 lb)  BMI 19.85 kg/m2  SpO2 99% General:   Alert,  pleasant and cooperative in NAD Head:  Normocephalic and atraumatic. Neck:  Supple; no masses or thyromegaly. Lungs:  Clear throughout to auscultation.    Heart:  Regular rate and rhythm. Abdomen:  Soft, nontender and nondistended. Normal bowel sounds, without guarding, and without rebound.   Neurologic:  Alert and  oriented x4;  grossly normal neurologically.  Impression/Plan: Vanessa NorthJoann Moore Barbour is here for an endoscopy to be performed for anemia, iron def.  Risks, benefits, limitations, and alternatives regarding  endoscopy have been reviewed with the patient.  Questions have  been answered.  All parties agreeable.   Lynnae Prude, MD  10/14/2015, 3:49 PM

## 2015-10-14 NOTE — Anesthesia Procedure Notes (Signed)
Date/Time: 10/14/2015 3:58 PM Performed by: Ginger CarneMICHELET, Minnie Shi Pre-anesthesia Checklist: Patient identified, Emergency Drugs available, Suction available, Patient being monitored and Timeout performed Patient Re-evaluated:Patient Re-evaluated prior to inductionOxygen Delivery Method: Simple face mask

## 2015-10-14 NOTE — Anesthesia Preprocedure Evaluation (Signed)
Anesthesia Evaluation  Patient identified by MRN, date of birth, ID band Patient awake    Reviewed: Allergy & Precautions, NPO status , Patient's Chart, lab work & pertinent test results  Airway Mallampati: I       Dental  (+) Teeth Intact   Pulmonary neg pulmonary ROS,    breath sounds clear to auscultation       Cardiovascular negative cardio ROS   Rhythm:Regular Rate:Normal     Neuro/Psych negative neurological ROS     GI/Hepatic Neg liver ROS, GERD  Medicated,  Endo/Other  negative endocrine ROS  Renal/GU      Musculoskeletal negative musculoskeletal ROS (+)   Abdominal Normal abdominal exam  (+)   Peds negative pediatric ROS (+)  Hematology negative hematology ROS (+)   Anesthesia Other Findings   Reproductive/Obstetrics                             Anesthesia Physical Anesthesia Plan  ASA: I  Anesthesia Plan: General   Post-op Pain Management:    Induction: Intravenous  Airway Management Planned: Natural Airway and Nasal Cannula  Additional Equipment:   Intra-op Plan:   Post-operative Plan: Extubation in OR  Informed Consent: I have reviewed the patients History and Physical, chart, labs and discussed the procedure including the risks, benefits and alternatives for the proposed anesthesia with the patient or authorized representative who has indicated his/her understanding and acceptance.     Plan Discussed with: CRNA  Anesthesia Plan Comments:         Anesthesia Quick Evaluation

## 2015-10-14 NOTE — Anesthesia Postprocedure Evaluation (Signed)
Anesthesia Post Note  Patient: Vanessa Cisneros  Procedure(s) Performed: Procedure(s) (LRB): ESOPHAGOGASTRODUODENOSCOPY (EGD) WITH PROPOFOL (N/A)  Patient location during evaluation: PACU Anesthesia Type: General Level of consciousness: awake Pain management: pain level controlled Vital Signs Assessment: post-procedure vital signs reviewed and stable Respiratory status: spontaneous breathing Cardiovascular status: blood pressure returned to baseline Postop Assessment: no headache Anesthetic complications: no    Last Vitals:  Filed Vitals:   10/14/15 1640 10/14/15 1649  BP: 122/57 126/52  Pulse: 76 72  Temp:    Resp: 19 29    Last Pain:  Filed Vitals:   10/14/15 1654  PainSc: 3                  VAN STAVEREN,Darielys Giglia

## 2015-10-14 NOTE — Transfer of Care (Signed)
Immediate Anesthesia Transfer of Care Note  Patient: Vanessa NorthJoann Moore Deacon  Procedure(s) Performed: Procedure(s): ESOPHAGOGASTRODUODENOSCOPY (EGD) WITH PROPOFOL (N/A)  Patient Location: PACU  Anesthesia Type:General  Level of Consciousness: sedated  Airway & Oxygen Therapy: Patient Spontanous Breathing  Post-op Assessment: Report given to RN  Post vital signs: Reviewed  Last Vitals:  Filed Vitals:   10/14/15 1508 10/14/15 1609  BP: 121/42 100/40  Pulse: 75 75  Temp: 36.5 C 35.9 C  Resp: 16 19    Last Pain:  Filed Vitals:   10/14/15 1609  PainSc: 3          Complications: No apparent anesthesia complications

## 2015-10-14 NOTE — Op Note (Signed)
[facility] Gastroenterology Patient Name: Vanessa Cisneros Procedure Date: 10/14/2015 3:47 PM MRN: [id number] Account #: [id number] Date of Birth: 05/22/1946 Admit Type: Outpatient Age: 70 Room: [facility] ENDO ROOM 1 Gender: Female Note Status: Finalized Procedure:            Upper GI endoscopy Indications:          Iron deficiency anemia Providers:            Scot Jun, MD Referring MD:         Danella Penton, MD (Referring MD) Medicines:            Propofol per Anesthesia Complications:        No immediate complications. Procedure:            Pre-Anesthesia Assessment:                       - After reviewing the risks and benefits, the patient                        was deemed in satisfactory condition to undergo the                        procedure.                       After obtaining informed consent, the endoscope was                        passed under direct vision. Throughout the procedure,                        the patient's blood pressure, pulse, and oxygen                        saturations were monitored continuously. The Endoscope                        was introduced through the mouth, and advanced to the                        second part of duodenum. The upper GI endoscopy was                        accomplished without difficulty. The patient tolerated                        the procedure well. Findings:      The examined esophagus was normal.      Localized mildly erythematous mucosa without bleeding was found in the       gastric antrum. Biopsies were taken with a cold forceps for histology.       Biopsies were taken with a cold forceps for Helicobacter pylori testing.      Diffuse granular mucosa was found in the gastric body. Biopsies were       taken with a cold forceps for histology.      The examined duodenum was normal. Impression:           - Normal esophagus.                       - Erythematous mucosa in  the antrum. Biopsied.                    - Granular gastric mucosa. Biopsied.                       - Normal examined duodenum. Recommendation:       - Await pathology results. Scot Junobert T Taneisha Fuson, MD 10/14/2015 4:58:13 PM This report has been signed electronically. Number of Addenda: 0 Note Initiated On: 10/14/2015 3:47 PM      [facility]

## 2015-10-15 ENCOUNTER — Encounter: Payer: Self-pay | Admitting: Unknown Physician Specialty

## 2015-10-16 LAB — SURGICAL PATHOLOGY

## 2019-04-10 ENCOUNTER — Other Ambulatory Visit: Payer: Self-pay

## 2019-04-10 DIAGNOSIS — Z20822 Contact with and (suspected) exposure to covid-19: Secondary | ICD-10-CM

## 2019-04-12 ENCOUNTER — Telehealth: Payer: Self-pay | Admitting: General Practice

## 2019-04-12 LAB — NOVEL CORONAVIRUS, NAA: SARS-CoV-2, NAA: NOT DETECTED

## 2019-04-12 NOTE — Telephone Encounter (Signed)
Negative COVID results given. Patient results "NOT Detected." Caller expressed understanding. ° °

## 2019-05-15 ENCOUNTER — Other Ambulatory Visit: Payer: Self-pay

## 2019-05-15 DIAGNOSIS — Z20822 Contact with and (suspected) exposure to covid-19: Secondary | ICD-10-CM

## 2019-05-17 LAB — NOVEL CORONAVIRUS, NAA: SARS-CoV-2, NAA: NOT DETECTED

## 2019-05-18 ENCOUNTER — Telehealth: Payer: Self-pay

## 2019-05-18 NOTE — Telephone Encounter (Signed)
Caller given negative result and verbalized understanding  

## 2019-07-25 ENCOUNTER — Other Ambulatory Visit: Payer: Self-pay | Admitting: Internal Medicine

## 2019-07-25 DIAGNOSIS — M5116 Intervertebral disc disorders with radiculopathy, lumbar region: Secondary | ICD-10-CM

## 2019-08-04 ENCOUNTER — Ambulatory Visit
Admission: RE | Admit: 2019-08-04 | Discharge: 2019-08-04 | Disposition: A | Discharge status: Home / Self Care | Payer: Medicare Other | Source: Ambulatory Visit | Attending: Internal Medicine | Admitting: Internal Medicine

## 2019-08-04 ENCOUNTER — Other Ambulatory Visit: Payer: Self-pay

## 2019-08-04 DIAGNOSIS — M5116 Intervertebral disc disorders with radiculopathy, lumbar region: Secondary | ICD-10-CM | POA: Insufficient documentation

## 2019-10-24 ENCOUNTER — Ambulatory Visit
Admission: RE | Admit: 2019-10-24 | Discharge: 2019-10-24 | Disposition: A | Discharge status: Home / Self Care | Payer: Medicare Other | Source: Ambulatory Visit | Attending: Neurosurgery | Admitting: Neurosurgery

## 2019-10-24 ENCOUNTER — Other Ambulatory Visit: Payer: Self-pay | Admitting: Neurosurgery

## 2019-10-24 DIAGNOSIS — M48061 Spinal stenosis, lumbar region without neurogenic claudication: Secondary | ICD-10-CM | POA: Diagnosis not present

## 2019-12-20 ENCOUNTER — Ambulatory Visit: Payer: Medicare Other | Admitting: Pain Medicine

## 2020-08-26 ENCOUNTER — Other Ambulatory Visit: Admission: RE | Admit: 2020-08-26 | Payer: Medicare Other | Source: Ambulatory Visit

## 2020-08-28 ENCOUNTER — Ambulatory Visit: Admission: RE | Admit: 2020-08-28 | Payer: Medicare Other | Source: Home / Self Care | Admitting: Internal Medicine

## 2020-08-28 ENCOUNTER — Encounter: Admission: RE | Payer: Self-pay | Source: Home / Self Care

## 2020-08-28 SURGERY — ESOPHAGOGASTRODUODENOSCOPY (EGD) WITH PROPOFOL
Anesthesia: General

## 2021-12-24 IMAGING — MR MR LUMBAR SPINE W/O CM
4 of 5 series · 33 of 48 positions shown · non-contrast
Comparison: MRI of the lumbar spine December 05, 2013.

CLINICAL DATA: Low back pain radiating to both legs.

EXAM:
MRI LUMBAR SPINE WITHOUT CONTRAST
TECHNIQUE: Multiplanar, multisequence MR imaging of the lumbar spine was
performed. No intravenous contrast was administered.

[Series 6: T2 · sagittal · 4.0mm · 0.81mm/px · 8 of 17 slices shown (1 of 2)]
[im 1/17]
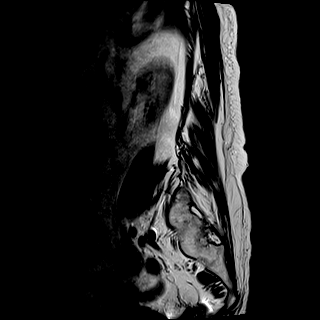
[im 3/17]
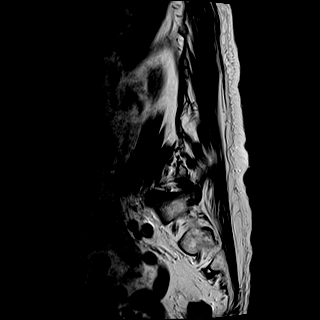
[im 5/17]
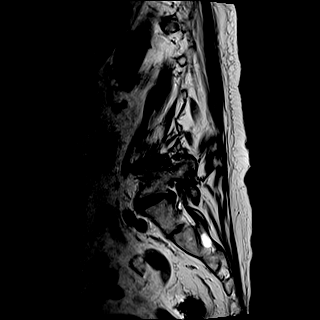
[im 7/17]
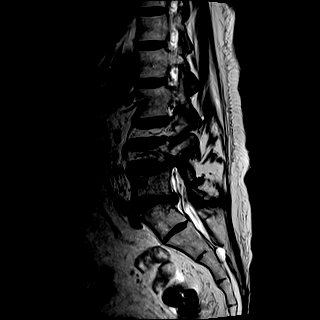
[im 10/17]
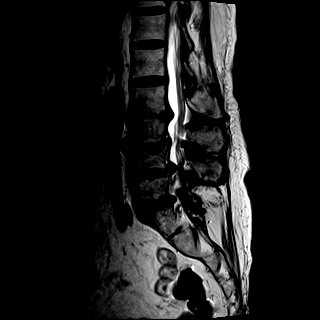
[im 12/17]
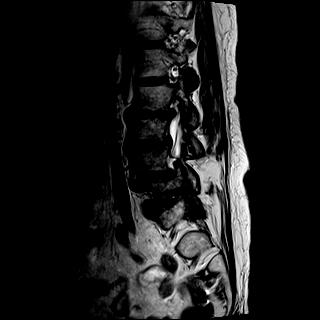
[im 14/17]
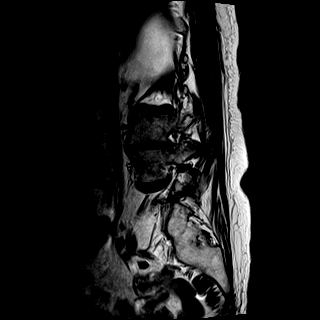
[im 17/17]
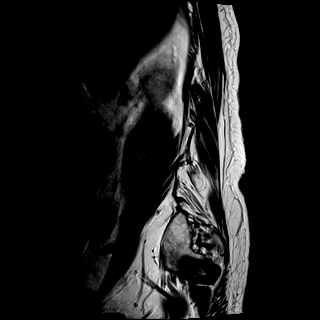

[Series 7: T1 · sagittal · 4.0mm · 0.81mm/px · 7 of 17 slices shown (1 of 2)]
[im 1/17]
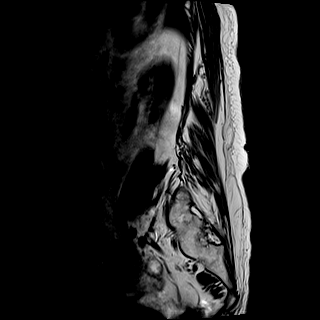
[im 3/17]
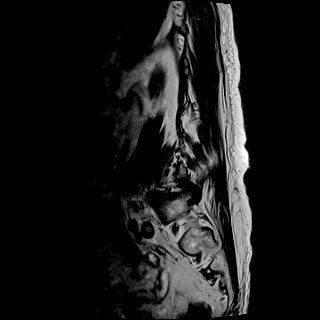
[im 6/17]
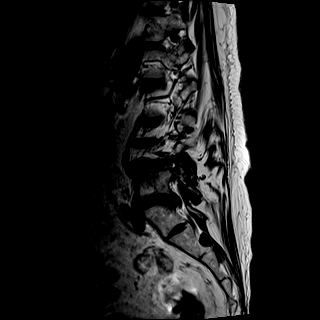
[im 9/17]
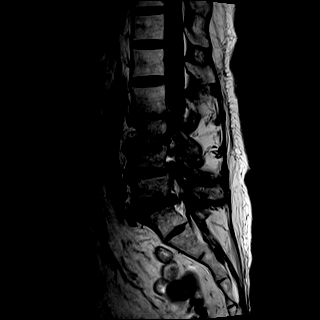
[im 11/17]
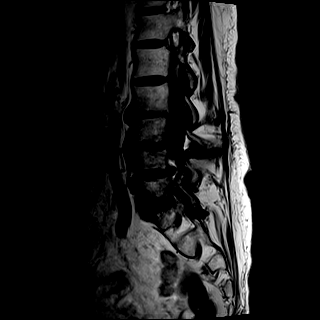
[im 14/17]
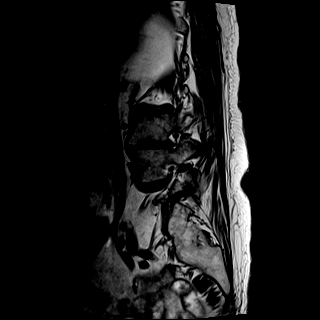
[im 17/17]
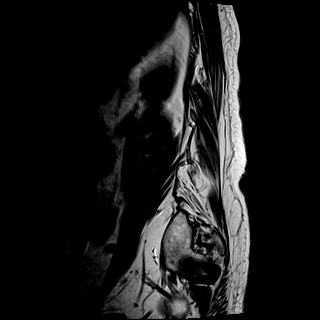

[Series 9: T2 · axial · 4.0mm · 0.78mm/px · z∈[-9,+139]mm · 9 of 30 slices shown (2 of 2)]
[im 1/30]
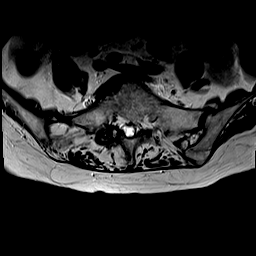
[im 5/30]
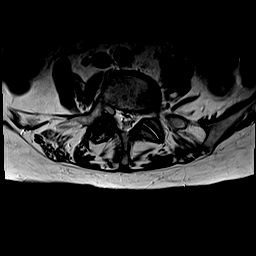
[im 10/30]
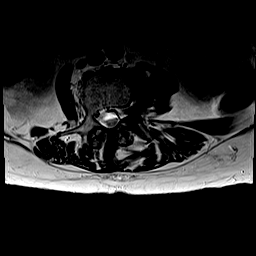
[im 13/30]
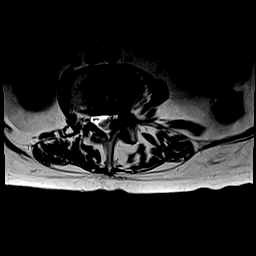
[im 15/30]
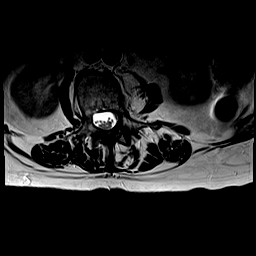
[im 17/30]
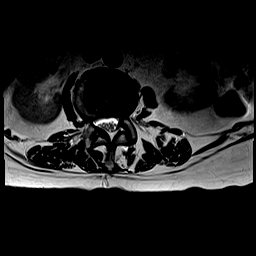
[im 20/30]
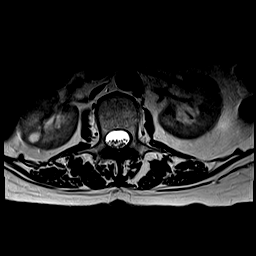
[im 25/30]
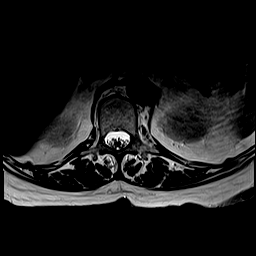
[im 30/30]
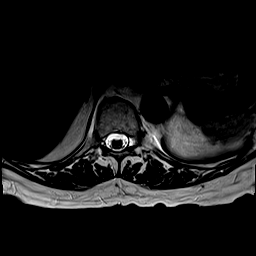

[Series 10: T1 · axial · 4.0mm · 0.39mm/px · z∈[-9,+139]mm · 9 of 30 slices shown (2 of 2)]
[im 1/30]
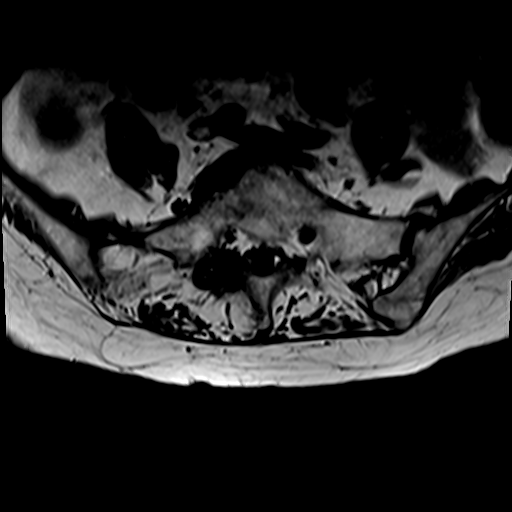
[im 5/30]
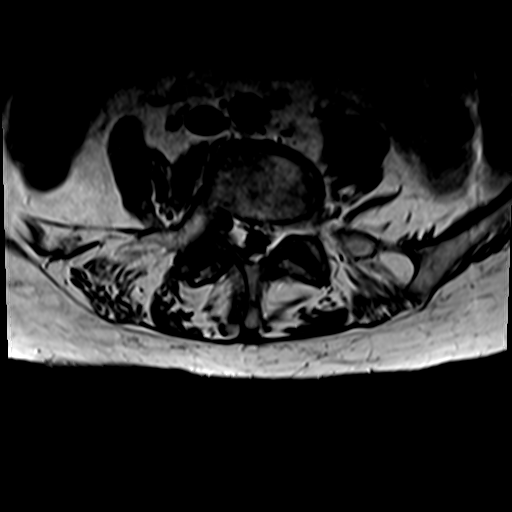
[im 10/30]
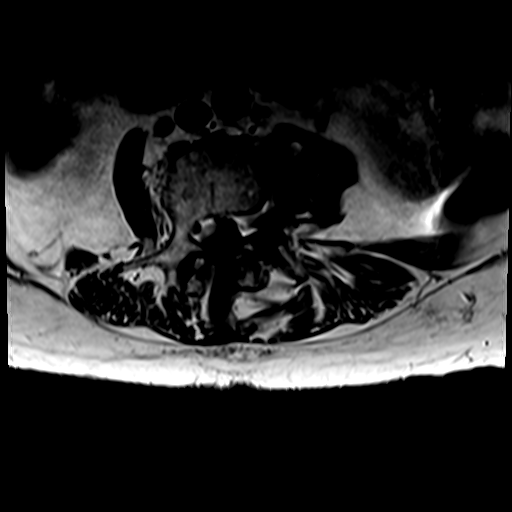
[im 13/30]
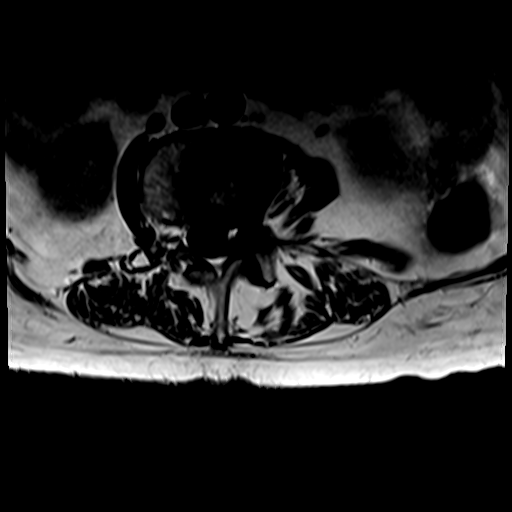
[im 15/30]
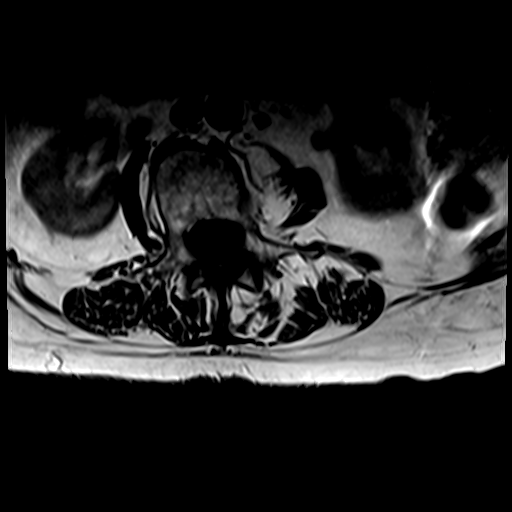
[im 17/30]
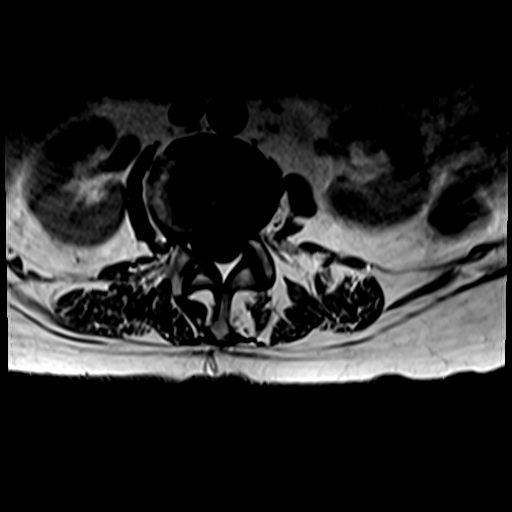
[im 20/30]
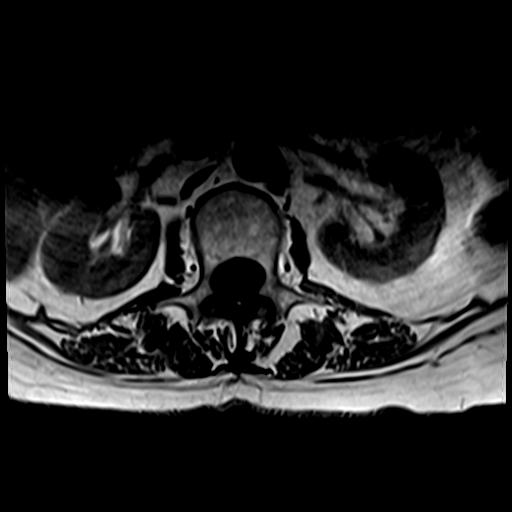
[im 25/30]
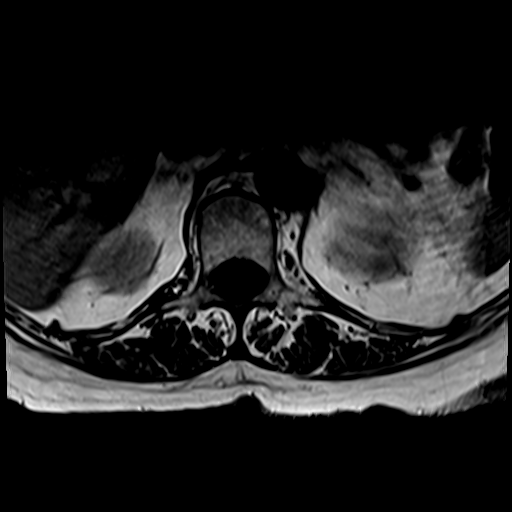
[im 30/30]
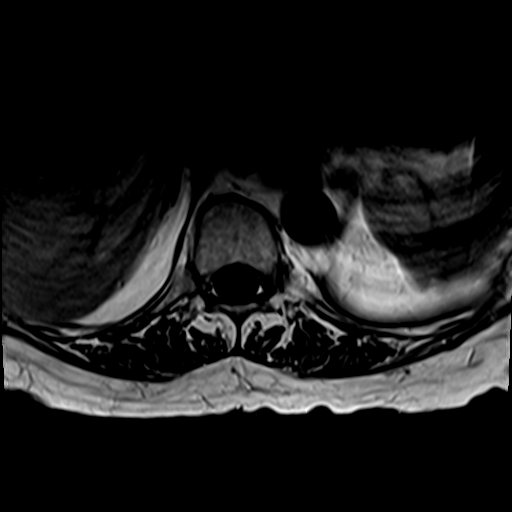

[33 of 48 positions shown; findings below may reference images not displayed]

FINDINGS: Segmentation: A transitional lumbosacral vertebra is assumed to
represent the S1 level with prominent S1-S2 disc space. Numbering
convention used on prior exam preserved. Careful correlation with
this numbering strategy prior to any procedural intervention would
be recommended.

Alignment: Dextroconvex scoliosis of the lumbar spine. Minimal
anterolisthesis of L3 over L4 and L4 over L5.

Vertebrae: No fracture, evidence of discitis, or bone lesion.
Degenerative endplate changes from L2-3 through L5-S1.

Conus medullaris and cauda equina: Conus extends to the L1 level.
Conus and cauda equina appear normal.

Paraspinal and other soft tissues: Bilateral renal cysts.

Disc levels:

T12-L1: Small posterior disc protrusion. No spinal canal or neural
foraminal stenosis.

L1-2: No spinal canal or neural foraminal stenosis.

L2-3: Disc bulge and mild facet degenerative changes resulting in
mild bilateral neural foraminal narrowing. No significant spinal
canal stenosis.

L3-4: Disc bulge, facet degenerative changes and ligamentum flavum
redundancy, left greater than right, resulting in narrowing of the
left subarticular zone, severe left and mild right neural foraminal
narrowing.

L4-5: Prominent disc protrusion, moderate facet degenerative changes
and ligamentum flavum redundancy resulting in severe spinal canal
stenosis and moderate to severe bilateral neural foraminal
narrowing.

L5-S1: Prominent disc bulge, moderate facet degenerative changes,
right greater than left and prominence of the epidural fat resulting
in severe narrowing of the thecal sac, severe right and mild left
neural foraminal narrowing.

Compared to prior study, there has been significant progression of
the degenerative disc disease and degree of spinal canal and neural
foraminal stenosis from L2-3 through L5-S1.
IMPRESSION: 1. Transitional lumbosacral vertebra is assumed to represent the S1
level with prominent S1-S2 disc space. Numbering convention used on
prior exam preserved. Careful correlation with this numbering
strategy prior to any procedural intervention would be recommended.
2. Interval progression of multilevel degenerative changes of the
lumbar spine with severe spinal canal stenosis at L4-L5 and severe
narrowing of the thecal sac at L5-S1.
3. High-grade neural foraminal narrowing on the left at L3-4,
bilaterally at L4-L5 and on the right at L5-S1.

## 2023-07-13 ENCOUNTER — Encounter: Payer: Self-pay | Admitting: Internal Medicine

## 2023-07-20 ENCOUNTER — Encounter: Payer: Self-pay | Admitting: Internal Medicine

## 2023-07-21 ENCOUNTER — Encounter: Payer: Self-pay | Admitting: Internal Medicine

## 2023-07-21 ENCOUNTER — Ambulatory Visit: Payer: Medicare Other

## 2023-07-21 ENCOUNTER — Encounter
Admission: RE | Disposition: A | Discharge status: Home / Self Care | Payer: Self-pay | Source: Home / Self Care | Attending: Internal Medicine

## 2023-07-21 ENCOUNTER — Ambulatory Visit
Admission: RE | Admit: 2023-07-21 | Discharge: 2023-07-21 | Disposition: A | Discharge status: Home / Self Care | Payer: Medicare Other | Attending: Internal Medicine | Admitting: Internal Medicine

## 2023-07-21 DIAGNOSIS — K640 First degree hemorrhoids: Secondary | ICD-10-CM | POA: Insufficient documentation

## 2023-07-21 DIAGNOSIS — F32A Depression, unspecified: Secondary | ICD-10-CM | POA: Insufficient documentation

## 2023-07-21 DIAGNOSIS — R1314 Dysphagia, pharyngoesophageal phase: Secondary | ICD-10-CM | POA: Insufficient documentation

## 2023-07-21 DIAGNOSIS — Q438 Other specified congenital malformations of intestine: Secondary | ICD-10-CM | POA: Diagnosis not present

## 2023-07-21 DIAGNOSIS — I081 Rheumatic disorders of both mitral and tricuspid valves: Secondary | ICD-10-CM | POA: Insufficient documentation

## 2023-07-21 DIAGNOSIS — K219 Gastro-esophageal reflux disease without esophagitis: Secondary | ICD-10-CM | POA: Insufficient documentation

## 2023-07-21 DIAGNOSIS — Z1211 Encounter for screening for malignant neoplasm of colon: Secondary | ICD-10-CM | POA: Insufficient documentation

## 2023-07-21 DIAGNOSIS — K449 Diaphragmatic hernia without obstruction or gangrene: Secondary | ICD-10-CM | POA: Diagnosis not present

## 2023-07-21 DIAGNOSIS — K5730 Diverticulosis of large intestine without perforation or abscess without bleeding: Secondary | ICD-10-CM | POA: Insufficient documentation

## 2023-07-21 DIAGNOSIS — M1990 Unspecified osteoarthritis, unspecified site: Secondary | ICD-10-CM | POA: Insufficient documentation

## 2023-07-21 HISTORY — DX: Other immunodeficiencies: D84.89

## 2023-07-21 HISTORY — DX: Depression, unspecified: F32.A

## 2023-07-21 HISTORY — DX: Spinal stenosis, lumbar region with neurogenic claudication: M48.062

## 2023-07-21 HISTORY — DX: Mixed hyperlipidemia: E78.2

## 2023-07-21 HISTORY — DX: Anemia, unspecified: D64.9

## 2023-07-21 HISTORY — DX: Inflammatory polyarthropathy: M06.4

## 2023-07-21 HISTORY — DX: Type 2 diabetes mellitus without complications: E11.9

## 2023-07-21 HISTORY — PX: ESOPHAGOGASTRODUODENOSCOPY (EGD) WITH PROPOFOL: SHX5813

## 2023-07-21 HISTORY — DX: Gastro-esophageal reflux disease without esophagitis: K21.9

## 2023-07-21 HISTORY — PX: COLONOSCOPY WITH PROPOFOL: SHX5780

## 2023-07-21 SURGERY — COLONOSCOPY WITH PROPOFOL
Anesthesia: General

## 2023-07-21 MED ORDER — PROPOFOL 10 MG/ML IV BOLUS
INTRAVENOUS | Status: DC | PRN
  Administered 2023-07-21: 50 ug/kg/min via INTRAVENOUS
  Administered 2023-07-21: 40 mg via INTRAVENOUS
  Administered 2023-07-21: 30 mg via INTRAVENOUS
  Administered 2023-07-21 (×3): 50 mg via INTRAVENOUS
  Administered 2023-07-21: 20 mg via INTRAVENOUS

## 2023-07-21 MED ORDER — PROPOFOL 1000 MG/100ML IV EMUL
INTRAVENOUS | Status: AC
  Filled 2023-07-21: qty 100

## 2023-07-21 MED ORDER — LIDOCAINE HCL (CARDIAC) PF 100 MG/5ML IV SOSY
PREFILLED_SYRINGE | INTRAVENOUS | Status: DC | PRN
  Administered 2023-07-21: 50 mg via INTRAVENOUS

## 2023-07-21 MED ORDER — SODIUM CHLORIDE 0.9 % IV SOLN: INTRAVENOUS | Status: DC

## 2023-07-21 MED ORDER — LIDOCAINE HCL (PF) 2 % IJ SOLN
INTRAMUSCULAR | Status: AC
  Filled 2023-07-21: qty 5

## 2023-07-21 MED ORDER — ALBUTEROL SULFATE HFA 108 (90 BASE) MCG/ACT IN AERS
INHALATION_SPRAY | RESPIRATORY_TRACT | Status: AC
  Filled 2023-07-21: qty 6.7

## 2023-07-21 NOTE — Anesthesia Postprocedure Evaluation (Signed)
Anesthesia Post Note  Patient: Vanessa Cisneros  Procedure(s) Performed: COLONOSCOPY WITH PROPOFOL ESOPHAGOGASTRODUODENOSCOPY (EGD) WITH PROPOFOL  Patient location during evaluation: Endoscopy Anesthesia Type: General Level of consciousness: awake and alert Pain management: pain level controlled Vital Signs Assessment: post-procedure vital signs reviewed and stable Respiratory status: spontaneous breathing, nonlabored ventilation, respiratory function stable and patient connected to nasal cannula oxygen Cardiovascular status: blood pressure returned to baseline and stable Postop Assessment: no apparent nausea or vomiting Anesthetic complications: no   No notable events documented.   Last Vitals:  Vitals:   07/21/23 1033 07/21/23 1043  BP: 137/72 (!) 164/85  Pulse: 81 79  Resp: 20 20  Temp:    SpO2: 97% 95%    Last Pain:  Vitals:   07/21/23 1043  TempSrc:   PainSc: 0-No pain                 Corinda Gubler

## 2023-07-21 NOTE — Interval H&P Note (Signed)
History and Physical Interval Note:  07/21/2023 9:27 AM  Chyrl Civatte Malen Gauze  has presented today for surgery, with the diagnosis of R13.19 (ICD-10-CM) - Esophageal dysphagia Z98.890 (ICD-10-CM) - History of esophageal dilatation K21.9, K44.9 (ICD-10-CM) - Gastroesophageal reflux disease with hiatal hernia 784.99 (ICD-9-CM) - R09.A2 (ICD-10-CM) - Globus sensation Z79.1 (ICD-10-CM) - NSAID long-term use Z12.11 (ICD-10-CM) - Colon cancer screening.  The various methods of treatment have been discussed with the patient and family. After consideration of risks, benefits and other options for treatment, the patient has consented to  Procedure(s): COLONOSCOPY WITH PROPOFOL (N/A) ESOPHAGOGASTRODUODENOSCOPY (EGD) WITH PROPOFOL (N/A) as a surgical intervention.  The patient's history has been reviewed, patient examined, no change in status, stable for surgery.  I have reviewed the patient's chart and labs.  Questions were answered to the patient's satisfaction.     [place], [place]

## 2023-07-21 NOTE — Anesthesia Procedure Notes (Signed)
Procedure Name: General with mask airway Date/Time: 07/21/2023 9:39 AM  Performed by: Lily Lovings, CRNAPre-anesthesia Checklist: Patient identified, Emergency Drugs available, Suction available, Patient being monitored and Timeout performed Patient Re-evaluated:Patient Re-evaluated prior to induction Oxygen Delivery Method: Simple face mask Preoxygenation: Pre-oxygenation with 100% oxygen Induction Type: IV induction

## 2023-07-21 NOTE — Op Note (Signed)
[facility] Gastroenterology Patient Name: Vanessa Cisneros Procedure Date: 07/21/2023 9:26 AM MRN: [id number] Account #: [id number] Date of Birth: 04-09-1946 Admit Type: Outpatient Age: 78 Room: [facility] ENDO ROOM 2 Gender: Female Note Status: Supervisor Override Instrument Name: Upper Endoscope [id number] Procedure:             Upper GI endoscopy Indications:           Esophageal dysphagia Providers:             Royce Macadamia K. Camera Krienke MD, MD Medicines:             Propofol per Anesthesia Complications:         No immediate complications. Estimated blood loss: None. Procedure:             Pre-Anesthesia Assessment:                        - The risks and benefits of the procedure and the                         sedation options and risks were discussed with the                         patient. All questions were answered and informed                         consent was obtained.                        - Patient identification and proposed procedure were                         verified prior to the procedure by the nurse. The                         procedure was verified in the procedure room.                        - ASA Grade Assessment: III - A patient with severe                         systemic disease.                        - After reviewing the risks and benefits, the patient                         was deemed in satisfactory condition to undergo the                         procedure.                        After obtaining informed consent, the endoscope was                         passed under direct vision. Throughout the procedure,                         the patient's blood pressure, pulse, and oxygen  saturations were monitored continuously. The Endoscope                         was introduced through the mouth, and advanced to the                         third part of duodenum. The upper GI endoscopy was                         somewhat  difficult due to unusual anatomy. Successful                         completion of the procedure was aided by using scope                         torsion. The patient tolerated the procedure well. Findings:      The esophagus was normal.      There is no endoscopic evidence of stenosis, stricture, ulcerations or       mass in the entire esophagus.      A large possible paraesophageal hernia was found.      No other significant abnormalities were identified in a careful       examination of the stomach.      J-shaped stomach      The examined duodenum was normal. Impression:            - Normal esophagus.                        - Large paraesophageal hernia.                        - Normal examined duodenum.                        - No specimens collected. Recommendation:        - Do an upper GI series.                        - Proceed with colonoscopy Procedure Code(s):     --- Professional ---                        [phone], Esophagogastroduodenoscopy, flexible,                         transoral; diagnostic, including collection of                         specimen(s) by brushing or washing, when performed                         (separate procedure) Diagnosis Code(s):     --- Professional ---                        R13.14, Dysphagia, pharyngoesophageal phase                        K44.9, Diaphragmatic hernia without obstruction or  gangrene CPT copyright 2022 American Medical Association. All rights reserved. The codes documented in this report are preliminary and upon coder review may  be revised to meet current compliance requirements. Stanton Kidney MD, MD 07/21/2023 9:53:25 AM This report has been signed electronically. Number of Addenda: 0 Note Initiated On: 07/21/2023 9:26 AM Estimated Blood Loss:  Estimated blood loss: none.      [facility]

## 2023-07-21 NOTE — Transfer of Care (Signed)
Immediate Anesthesia Transfer of Care Note  Patient: Vanessa Cisneros  Procedure(s) Performed: COLONOSCOPY WITH PROPOFOL ESOPHAGOGASTRODUODENOSCOPY (EGD) WITH PROPOFOL  Patient Location: Endoscopy Unit  Anesthesia Type:General  Level of Consciousness: drowsy and patient cooperative  Airway & Oxygen Therapy: Patient Spontanous Breathing and Patient connected to face mask oxygen  Post-op Assessment: Report given to RN and Post -op Vital signs reviewed and unstable, Anesthesiologist notified  Post vital signs: Reviewed and stable  Last Vitals:  Vitals Value Taken Time  BP 108/59 07/21/23 1025  Temp 35.8 C 07/21/23 1023  Pulse 73 07/21/23 1025  Resp 19 07/21/23 1025  SpO2 100 % 07/21/23 1025  Vitals shown include unfiled device data.  Last Pain:  Vitals:   07/21/23 1023  TempSrc: Tympanic  PainSc: Asleep         Complications: No notable events documented.

## 2023-07-21 NOTE — H&P (Signed)
Outpatient short stay form Pre-procedure 07/21/2023 9:26 AM Vanessa Cisneros Vanessa Cisneros, M.D.  Primary Physician: Vanessa Cisneros, M.D.  Reason for visit:  Change in bowel habits, GERD, hx of esophageal stricture, dysphagia.   History of present illness:  - CSY: 02/10/2002 - normal examined colon - CSY: 05/13/2012 - tortuous colon, sigmoid diverticulosis, otherwise normal examined colon - EGD: 03/09/2013 - normal esophagus, large hiatal hernia, gastritis, normal duodenum  - EGD: 10/14/2015 - normal esophagus, H pylori negative gastritis, normal examined duodenum  GI Medications: Current: omeprazole 40 mg PO BID, sucralfate 1 gram PO BID, Vitron-C 65 mg PO daily Prior:  Interval History   Vanessa Cisneros presents to the [place] [facility] at the request of Dr. Bethann Cisneros to follow-up on esophageal dysphagia. She is wanting to get the EGD and colonoscopy scheduled since these were not done last year. She reports she never got a date scheduled last year and isn't sure why they were not done. She has continues to have issues with dysphagia, persistent globus sensation, and GERD symptoms. Over the weekend she had episode where a piece of chicken she was eating got stuck for 20-30 seconds at level of her suprasternal notch. It finally passed after drinking multiple gulps of water. She has had some mild issues to large pills, meats, and breads localized to her suprasternal notch over the years. She has hx of having emergency disimpaction with subsequent dilatations x3 in the 1980s. Her last EGD was in 2017 and showed normal esophagus with no dilatation performed. She reports she has chronic heartburn and acid reflux despite use of omeprazole 40 mg twice daily and sucralfate 1 gram twice daily. She has had mild normocytic anemia for years with recent 1-gram drop in hemoglobin which has since resolved. She denies any overt hematochezia or melena. She denies any abdominal pain. She has had more issues the past several months with  constipation where she has small volume BM c/w #1 on Bristol scale every 2-3 days. Most bowel movements she will have to strain. She is not currently taking any laxatives or stool softeners on a daily basis. She doesn't get a ton of fiber or water in her diet. She does enjoy snacking on junk food. She reports for 20+ years she has taken [facility] powders for osteoarthritis and muscle pain. She takes 1 every morning and sometimes can take 2-3 a day.     Current Facility-Administered Medications:    0.9 %  sodium chloride infusion, , Intravenous, Continuous, Kal Chait, Boykin Nearing, MD  Medications Prior to Admission  Medication Sig Dispense Refill Last Dose/Taking   torsemide (DEMADEX) 20 MG tablet Take 20 mg by mouth daily.     07/19/2023   Calcium Carbonate-Vitamin D (CALCIUM 600 + D PO) Take 1 tablet by mouth 2 (two) times daily.        Cyanocobalamin (VITAMIN B-12 PO) Take 1 tablet by mouth daily. Reported on 10/14/2015      cyclobenzaprine (FLEXERIL) 5 MG tablet Take 5 mg by mouth daily. Reported on 10/14/2015      fish oil-omega-3 fatty acids 1000 MG capsule Take 2 g by mouth 2 (two) times daily.     07/14/2023   lovastatin (MEVACOR) 20 MG tablet Take 20 mg by mouth at bedtime.     07/19/2023   meloxicam (MOBIC) 15 MG tablet Take 15 mg by mouth daily. Reported on 10/14/2015      omeprazole (PRILOSEC) 20 MG capsule Take 20 mg by mouth daily.  07/19/2023   OVER THE COUNTER MEDICATION Take 1 tablet by mouth daily. Nutri-Calm vitamin       sertraline (ZOLOFT) 100 MG tablet Take 100 mg by mouth daily.     07/19/2023     Allergies  Allergen Reactions   Levofloxacin     Patient stated that it makes her extremely sore, and she can hardly move.    Tramadol      Past Medical History:  Diagnosis Date   Anemia    Arthritis    CHF (congestive heart failure) (HCC)    Depression    Esophageal reflux    GERD (gastroesophageal reflux disease)    Hyperlipidemia, mixed    Immunodeficiency (cell-mediated) (HCC)     Inflammatory polyarthropathy (HCC)    Lumbar stenosis with neurogenic claudication     Review of systems:  Otherwise negative.    Physical Exam  Gen: Alert, oriented. Appears stated age.  HEENT: Price/AT. PERRLA. Lungs: CTA, no wheezes. CV: RR nl S1, S2. Abd: soft, benign, no masses. BS+ Ext: No edema. Pulses 2+    Planned procedures: Proceed with EGD and colonoscopy. The patient understands the nature of the planned procedure, indications, risks, alternatives and potential complications including but not limited to bleeding, infection, perforation, damage to internal organs and possible oversedation/side effects from anesthesia. The patient agrees and gives consent to proceed.  Please refer to procedure notes for findings, recommendations and patient disposition/instructions.     Tationa Stech Vanessa Cisneros, M.D. Gastroenterology 07/21/2023  9:26 AM

## 2023-07-21 NOTE — Anesthesia Preprocedure Evaluation (Signed)
Anesthesia Evaluation  Patient identified by MRN, date of birth, ID band Patient awake    Reviewed: Allergy & Precautions, NPO status , Patient's Chart, lab work & pertinent test results  History of Anesthesia Complications Negative for: history of anesthetic complications  Airway Mallampati: II  TM Distance: >3 FB Neck ROM: Full    Dental no notable dental hx. (+) Teeth Intact   Pulmonary neg sleep apnea, neg COPD, Patient abstained from smoking.Not current smoker 6 days ago patient presented to PCP with complaints of cough/abdominal pain. She was clinically diagnosed with community acquired pneumonia (No CXR had resulted at time of diagnosis or treatment). She was prescribed steroids and abx. Subsequent CXR read showed no acute cardiopulmonary disease.   Pulmonary exam normal breath sounds clear to auscultation       Cardiovascular Exercise Tolerance: Good METS(-) hypertension(-) CAD, (-) Past MI and (-) CHF (-) dysrhythmias + Valvular Problems/Murmurs  Rhythm:Regular Rate:Normal - Systolic murmurs TTE 2023: NTERPRETATION  NORMAL LEFT VENTRICULAR SYSTOLIC FUNCTION WITH AN ESTIMATED EF = >55 %  NORMAL RIGHT VENTRICULAR SYSTOLIC FUNCTION  MODERATE TRICUSPID VALVE INSUFFICIENCY  MILD MITRAL VALVE INSUFFICIENCY  TRACE AORTIC VALVE INSUFFICIENCY  NO VALVULAR STENOSIS     Neuro/Psych  PSYCHIATRIC DISORDERS  Depression    negative neurological ROS     GI/Hepatic hiatal hernia,GERD  ,,(+)     (-) substance abuse    Endo/Other  neg diabetes    Renal/GU negative Renal ROS     Musculoskeletal  (+) Arthritis ,    Abdominal   Peds  Hematology   Anesthesia Other Findings Past Medical History: No date: Anemia No date: Arthritis No date: CHF (congestive heart failure) (HCC) No date: Depression No date: Esophageal reflux No date: GERD (gastroesophageal reflux disease) No date: Hyperlipidemia, mixed No date:  Immunodeficiency (cell-mediated) (HCC) No date: Inflammatory polyarthropathy (HCC) No date: Lumbar stenosis with neurogenic claudication  Reproductive/Obstetrics                             Anesthesia Physical Anesthesia Plan  ASA: 2  Anesthesia Plan: General   Post-op Pain Management: Minimal or no pain anticipated   Induction: Intravenous  PONV Risk Score and Plan: 3 and Propofol infusion, TIVA and Ondansetron  Airway Management Planned: Nasal Cannula  Additional Equipment: None  Intra-op Plan:   Post-operative Plan:   Informed Consent: I have reviewed the patients History and Physical, chart, labs and discussed the procedure including the risks, benefits and alternatives for the proposed anesthesia with the patient or authorized representative who has indicated his/her understanding and acceptance.     Dental advisory given  Plan Discussed with: CRNA and Surgeon  Anesthesia Plan Comments: (Had a detailed discussion about patient's respiratory symptoms. She has a mild residual cough. She denies ever having had fevers. Since she also did not have any acute pulmonary disease on CXR, I advised that it should be reasonable to proceed today, if she was OK with that , which she was. Discussed risks of anesthesia with patient, including possibility of difficulty with spontaneous ventilation under anesthesia necessitating airway intervention, PONV, and rare risks such as cardiac or respiratory or neurological events, and allergic reactions. Discussed the role of CRNA in patient's perioperative care. Patient understands.)       Anesthesia Quick Evaluation

## 2023-07-21 NOTE — Op Note (Signed)
[facility] Gastroenterology Patient Name: Vanessa Cisneros Procedure Date: 07/21/2023 9:26 AM MRN: [id number] Account #: [id number] Date of Birth: 21-Dec-1945 Admit Type: Outpatient Age: 78 Room: [facility] ENDO ROOM 2 Gender: Female Note Status: Supervisor Override Instrument Name: Peds Colonoscope [id number] Procedure:             Colonoscopy Indications:           Screening for colorectal malignant neoplasm Providers:             Royce Macadamia K. Ronne Stefanski MD, MD Medicines:             Propofol per Anesthesia Complications:         No immediate complications. Estimated blood loss: None. Procedure:             Pre-Anesthesia Assessment:                        - The risks and benefits of the procedure and the                         sedation options and risks were discussed with the                         patient. All questions were answered and informed                         consent was obtained.                        - Patient identification and proposed procedure were                         verified prior to the procedure by the nurse. The                         procedure was verified in the procedure room.                        - ASA Grade Assessment: III - A patient with severe                         systemic disease.                        - After reviewing the risks and benefits, the patient                         was deemed in satisfactory condition to undergo the                         procedure.                        After obtaining informed consent, the colonoscope was                         passed under direct vision. Throughout the procedure,                         the patient's blood pressure, pulse, and oxygen  saturations were monitored continuously. The                         Colonoscope was introduced through the anus with the                         intention of advancing to the cecum. The scope was                          advanced to the sigmoid colon before the procedure was                         aborted. Medications were given. The colonoscopy was                         technically difficult and complex due to extrinsic                         compression, multiple diverticula in the colon,                         restricted mobility of the colon and significant                         looping. The quality of the bowel preparation was                         good. The patient tolerated the procedure well. Findings:      The perianal and digital rectal examinations were normal. Pertinent       negatives include normal sphincter tone and no palpable rectal lesions.      Non-bleeding internal hemorrhoids were found during retroflexion. The       hemorrhoids were Grade I (internal hemorrhoids that do not prolapse).      Many medium-mouthed and small-mouthed diverticula were found in the       sigmoid colon.      The mid sigmoid colon was grossly tortuous. Despite switching out the       peds colonoscope for a gastroscope, Due to obstruction and tortuosity,       the procedure was aborted at this point and the scope was withdrawn. Impression:            - Non-bleeding internal hemorrhoids.                        - Diverticulosis in the sigmoid colon.                        - Tortuous colon.                        - No specimens collected.                        - The procedure was aborted due to restricted mobility                         of the colon. Recommendation:        - Perform a virtual colonoscopy at appointment to be  scheduled.                        - Telephone GI office to schedule appointment in 1                         month.                        - Follow up with Jacob Moores, PA-C in the GI office.                         [phone]                        - The findings and recommendations were discussed with                         the patient. Procedure  Code(s):     --- Professional ---                        [phone], 53, Colonoscopy, flexible; diagnostic,                         including collection of specimen(s) by brushing or                         washing, when performed (separate procedure) Diagnosis Code(s):     --- Professional ---                        Q43.8, Other specified congenital malformations of                         intestine                        K57.30, Diverticulosis of large intestine without                         perforation or abscess without bleeding                        R19.4, Change in bowel habit                        K64.0, First degree hemorrhoids CPT copyright 2022 [employer]. All rights reserved. The codes documented in this report are preliminary and upon coder review may  be revised to meet current compliance requirements. Stanton Kidney MD, MD 07/21/2023 10:27:21 AM This report has been signed electronically. Number of Addenda: 0 Note Initiated On: 07/21/2023 9:26 AM Total Procedure Duration: 0 hours 25 minutes 3 seconds  Estimated Blood Loss:  Estimated blood loss: none.      [facility]

## 2023-07-22 ENCOUNTER — Encounter: Payer: Self-pay | Admitting: Gastroenterology

## 2023-07-22 ENCOUNTER — Other Ambulatory Visit: Payer: Self-pay | Admitting: Gastroenterology

## 2023-07-22 ENCOUNTER — Encounter: Payer: Self-pay | Admitting: Internal Medicine

## 2023-07-22 DIAGNOSIS — K449 Diaphragmatic hernia without obstruction or gangrene: Secondary | ICD-10-CM

## 2023-07-22 DIAGNOSIS — R1319 Other dysphagia: Secondary | ICD-10-CM

## 2023-07-22 DIAGNOSIS — Q438 Other specified congenital malformations of intestine: Secondary | ICD-10-CM

## 2023-07-30 ENCOUNTER — Ambulatory Visit: Payer: Medicare Other | Attending: Gastroenterology

## 2023-08-10 ENCOUNTER — Ambulatory Visit
Admission: RE | Admit: 2023-08-10 | Discharge: 2023-08-10 | Disposition: A | Discharge status: Home / Self Care | Source: Ambulatory Visit | Attending: Gastroenterology | Admitting: Gastroenterology

## 2023-08-10 DIAGNOSIS — R1319 Other dysphagia: Secondary | ICD-10-CM | POA: Insufficient documentation

## 2023-08-10 DIAGNOSIS — K449 Diaphragmatic hernia without obstruction or gangrene: Secondary | ICD-10-CM | POA: Diagnosis present

## 2023-08-12 ENCOUNTER — Other Ambulatory Visit: Payer: Self-pay | Admitting: Gastroenterology

## 2023-08-12 DIAGNOSIS — R1319 Other dysphagia: Secondary | ICD-10-CM

## 2023-08-12 DIAGNOSIS — J986 Disorders of diaphragm: Secondary | ICD-10-CM

## 2023-08-12 DIAGNOSIS — K449 Diaphragmatic hernia without obstruction or gangrene: Secondary | ICD-10-CM

## 2023-08-19 ENCOUNTER — Other Ambulatory Visit: Payer: Self-pay | Admitting: Gastroenterology

## 2023-08-19 DIAGNOSIS — J986 Disorders of diaphragm: Secondary | ICD-10-CM

## 2023-08-19 DIAGNOSIS — K449 Diaphragmatic hernia without obstruction or gangrene: Secondary | ICD-10-CM

## 2023-08-19 DIAGNOSIS — R1319 Other dysphagia: Secondary | ICD-10-CM

## 2023-08-20 ENCOUNTER — Ambulatory Visit: Admission: RE | Admit: 2023-08-20 | Source: Ambulatory Visit

## 2023-08-31 ENCOUNTER — Emergency Department

## 2023-08-31 ENCOUNTER — Inpatient Hospital Stay
Admission: EM | Admit: 2023-08-31 | Discharge: 2023-09-03 | DRG: 392 | Disposition: A | Discharge status: Home / Self Care | Attending: Internal Medicine | Admitting: Internal Medicine

## 2023-08-31 ENCOUNTER — Other Ambulatory Visit: Payer: Self-pay

## 2023-08-31 DIAGNOSIS — E860 Dehydration: Secondary | ICD-10-CM | POA: Diagnosis present

## 2023-08-31 DIAGNOSIS — M48062 Spinal stenosis, lumbar region with neurogenic claudication: Secondary | ICD-10-CM | POA: Diagnosis present

## 2023-08-31 DIAGNOSIS — K219 Gastro-esophageal reflux disease without esophagitis: Secondary | ICD-10-CM | POA: Diagnosis present

## 2023-08-31 DIAGNOSIS — K648 Other hemorrhoids: Secondary | ICD-10-CM | POA: Diagnosis present

## 2023-08-31 DIAGNOSIS — A419 Sepsis, unspecified organism: Secondary | ICD-10-CM

## 2023-08-31 DIAGNOSIS — K529 Noninfective gastroenteritis and colitis, unspecified: Secondary | ICD-10-CM | POA: Diagnosis not present

## 2023-08-31 DIAGNOSIS — N1831 Chronic kidney disease, stage 3a: Secondary | ICD-10-CM | POA: Diagnosis present

## 2023-08-31 DIAGNOSIS — A09 Infectious gastroenteritis and colitis, unspecified: Secondary | ICD-10-CM | POA: Diagnosis not present

## 2023-08-31 DIAGNOSIS — K5900 Constipation, unspecified: Secondary | ICD-10-CM | POA: Diagnosis present

## 2023-08-31 DIAGNOSIS — I509 Heart failure, unspecified: Secondary | ICD-10-CM | POA: Diagnosis present

## 2023-08-31 DIAGNOSIS — K5730 Diverticulosis of large intestine without perforation or abscess without bleeding: Secondary | ICD-10-CM | POA: Diagnosis present

## 2023-08-31 DIAGNOSIS — F32A Depression, unspecified: Secondary | ICD-10-CM | POA: Diagnosis present

## 2023-08-31 DIAGNOSIS — Z791 Long term (current) use of non-steroidal anti-inflammatories (NSAID): Secondary | ICD-10-CM

## 2023-08-31 DIAGNOSIS — E782 Mixed hyperlipidemia: Secondary | ICD-10-CM | POA: Diagnosis present

## 2023-08-31 DIAGNOSIS — R1084 Generalized abdominal pain: Principal | ICD-10-CM

## 2023-08-31 DIAGNOSIS — Z79899 Other long term (current) drug therapy: Secondary | ICD-10-CM

## 2023-08-31 DIAGNOSIS — Z888 Allergy status to other drugs, medicaments and biological substances status: Secondary | ICD-10-CM

## 2023-08-31 DIAGNOSIS — N390 Urinary tract infection, site not specified: Secondary | ICD-10-CM

## 2023-08-31 DIAGNOSIS — J101 Influenza due to other identified influenza virus with other respiratory manifestations: Secondary | ICD-10-CM | POA: Diagnosis present

## 2023-08-31 DIAGNOSIS — K449 Diaphragmatic hernia without obstruction or gangrene: Secondary | ICD-10-CM | POA: Diagnosis present

## 2023-08-31 DIAGNOSIS — R112 Nausea with vomiting, unspecified: Secondary | ICD-10-CM

## 2023-08-31 DIAGNOSIS — E878 Other disorders of electrolyte and fluid balance, not elsewhere classified: Secondary | ICD-10-CM | POA: Diagnosis present

## 2023-08-31 DIAGNOSIS — K55039 Acute (reversible) ischemia of large intestine, extent unspecified: Secondary | ICD-10-CM | POA: Diagnosis present

## 2023-08-31 DIAGNOSIS — E871 Hypo-osmolality and hyponatremia: Secondary | ICD-10-CM | POA: Diagnosis present

## 2023-08-31 DIAGNOSIS — E876 Hypokalemia: Secondary | ICD-10-CM | POA: Diagnosis present

## 2023-08-31 DIAGNOSIS — N179 Acute kidney failure, unspecified: Secondary | ICD-10-CM | POA: Diagnosis present

## 2023-08-31 DIAGNOSIS — Z885 Allergy status to narcotic agent status: Secondary | ICD-10-CM

## 2023-08-31 DIAGNOSIS — E785 Hyperlipidemia, unspecified: Secondary | ICD-10-CM | POA: Insufficient documentation

## 2023-08-31 DIAGNOSIS — Z90710 Acquired absence of both cervix and uterus: Secondary | ICD-10-CM

## 2023-08-31 LAB — CBC WITH DIFFERENTIAL/PLATELET
Abs Immature Granulocytes: 0.05 10*3/uL (ref 0.00–0.07)
Basophils Absolute: 0.1 10*3/uL (ref 0.0–0.1)
Basophils Relative: 0 %
Eosinophils Absolute: 0 10*3/uL (ref 0.0–0.5)
Eosinophils Relative: 0 %
HCT: 38 % (ref 36.0–46.0)
Hemoglobin: 12.2 g/dL (ref 12.0–15.0)
Immature Granulocytes: 0 %
Lymphocytes Relative: 5 %
Lymphs Abs: 0.7 10*3/uL (ref 0.7–4.0)
MCH: 30.8 pg (ref 26.0–34.0)
MCHC: 32.1 g/dL (ref 30.0–36.0)
MCV: 96 fL (ref 80.0–100.0)
Monocytes Absolute: 1.2 10*3/uL — ABNORMAL HIGH (ref 0.1–1.0)
Monocytes Relative: 9 %
Neutro Abs: 10.5 10*3/uL — ABNORMAL HIGH (ref 1.7–7.7)
Neutrophils Relative %: 86 %
Platelets: 242 10*3/uL (ref 150–400)
RBC: 3.96 MIL/uL (ref 3.87–5.11)
RDW: 14.1 % (ref 11.5–15.5)
WBC: 12.4 10*3/uL — ABNORMAL HIGH (ref 4.0–10.5)
nRBC: 0 % (ref 0.0–0.2)

## 2023-08-31 LAB — TROPONIN I (HIGH SENSITIVITY): Troponin I (High Sensitivity): 6 ng/L (ref <18)

## 2023-08-31 LAB — RESP PANEL BY RT-PCR (RSV, FLU A&B, COVID)  RVPGX2
Influenza A by PCR: POSITIVE — AB
Influenza B by PCR: NEGATIVE
Resp Syncytial Virus by PCR: NEGATIVE
SARS Coronavirus 2 by RT PCR: NEGATIVE

## 2023-08-31 LAB — LIPASE, BLOOD: Lipase: 40 U/L (ref 11–51)

## 2023-08-31 LAB — COMPREHENSIVE METABOLIC PANEL
ALT: 17 U/L (ref 0–44)
AST: 24 U/L (ref 15–41)
Albumin: 3.9 g/dL (ref 3.5–5.0)
Alkaline Phosphatase: 43 U/L (ref 38–126)
Anion gap: 12 (ref 5–15)
BUN: 24 mg/dL — ABNORMAL HIGH (ref 8–23)
CO2: 26 mmol/L (ref 22–32)
Calcium: 9.7 mg/dL (ref 8.9–10.3)
Chloride: 94 mmol/L — ABNORMAL LOW (ref 98–111)
Creatinine, Ser: 1.23 mg/dL — ABNORMAL HIGH (ref 0.44–1.00)
GFR, Estimated: 44 mL/min — ABNORMAL LOW (ref >60)
Glucose, Bld: 100 mg/dL — ABNORMAL HIGH (ref 70–99)
Potassium: 3.7 mmol/L (ref 3.5–5.1)
Sodium: 132 mmol/L — ABNORMAL LOW (ref 135–145)
Total Bilirubin: 0.5 mg/dL (ref 0.0–1.2)
Total Protein: 7.2 g/dL (ref 6.5–8.1)

## 2023-08-31 LAB — LACTIC ACID, PLASMA: Lactic Acid, Venous: 1.4 mmol/L (ref 0.5–1.9)

## 2023-08-31 LAB — URINALYSIS, W/ REFLEX TO CULTURE (INFECTION SUSPECTED)
Bacteria, UA: NONE SEEN
Bilirubin Urine: NEGATIVE
Glucose, UA: 50 mg/dL — AB
Ketones, ur: 5 mg/dL — AB
Nitrite: NEGATIVE
Protein, ur: 30 mg/dL — AB
Specific Gravity, Urine: 1.012 (ref 1.005–1.030)
pH: 8 (ref 5.0–8.0)

## 2023-08-31 MED ORDER — IOHEXOL 300 MG/ML  SOLN
75.0000 mL | Freq: Once | INTRAMUSCULAR | Status: AC | PRN
  Administered 2023-08-31: 75 mL via INTRAVENOUS

## 2023-08-31 MED ORDER — MORPHINE SULFATE (PF) 2 MG/ML IV SOLN
2.0000 mg | Freq: Once | INTRAVENOUS | Status: AC
  Administered 2023-08-31: 2 mg via INTRAVENOUS
  Filled 2023-08-31: qty 1

## 2023-08-31 MED ORDER — IOHEXOL 9 MG/ML PO SOLN
500.0000 mL | ORAL | Status: DC
  Administered 2023-08-31: 500 mL via ORAL
  Filled 2023-08-31 (×2): qty 500

## 2023-08-31 MED ORDER — PIPERACILLIN-TAZOBACTAM 3.375 G IVPB 30 MIN
3.3750 g | Freq: Once | INTRAVENOUS | Status: DC
  Filled 2023-08-31 (×2): qty 50

## 2023-08-31 MED ORDER — LACTATED RINGERS IV BOLUS: 1000.0000 mL | Freq: Once | INTRAVENOUS | Status: DC

## 2023-08-31 MED ORDER — LACTATED RINGERS IV BOLUS
500.0000 mL | Freq: Once | INTRAVENOUS | Status: AC
  Administered 2023-08-31: 500 mL via INTRAVENOUS

## 2023-08-31 MED ORDER — ONDANSETRON HCL 4 MG/2ML IJ SOLN
4.0000 mg | Freq: Once | INTRAMUSCULAR | Status: AC
  Administered 2023-08-31: 4 mg via INTRAVENOUS
  Filled 2023-08-31: qty 2

## 2023-08-31 MED ORDER — OSELTAMIVIR PHOSPHATE 30 MG PO CAPS
30.0000 mg | ORAL_CAPSULE | Freq: Once | ORAL | Status: AC
  Administered 2023-09-01: 30 mg via ORAL
  Filled 2023-08-31: qty 1

## 2023-08-31 NOTE — Discharge Instructions (Addendum)
 Recommend rescheduling virtual colonoscopy and doing prep over two days and liquid diet for two to three days prior  Hold torsemide while having diarrhea

## 2023-08-31 NOTE — ED Provider Notes (Signed)
-----------------------------------------   11:40 PM on 08/31/2023 -----------------------------------------   CT abd/pel: Long segment of inflammatory change in the rectosigmoid consistent  with focal colitis. The more proximal colon is mildly dilated.    Prominent rugal folds within the stomach which may represent a  degree of gastritis. Large hiatal hernia is again noted.   Patient actively vomiting complaining of pain.  Given her myriad of symptoms, flu positive, UTI, CT findings, will initiate treatment with Tamiflu, IV antibiotics and consult hospitalist services for evaluation and admission.   Irean Hong, MD 09/01/23 [phone]

## 2023-08-31 NOTE — ED Triage Notes (Addendum)
 Patient to ED via [facility] from home. Patient began having nausea and vomiting around 2 PM today. Patient was seen about 3-4 weeks ago for an endoscopy and colonoscopy. There was concern she possibly had a hernia. Patient states the scope was rescheduled for tomorrow due to poor visualization during the last procedure. Patient began prepping for the procedures today when she experienced nausea and vomiting. Patient states this did not happen last time. Patient also endorses abdominal pain in the LLQ. Patient denies fever. Patient denies use of blood thinners.

## 2023-08-31 NOTE — ED Provider Notes (Signed)
 [facility] Provider Note    Event Date/Time   First MD Initiated Contact with Patient 08/31/23 2137     (approximate)   History   Nausea   HPI  Vanessa Cisneros is a 78 y.o. female with history of CHF, GERD, hyperlipidemia, inflammatory polyarthropathy, presenting with nausea, vomiting, diarrhea, abdominal pain.  Patient was taking bowel prep for an endoscopy tomorrow.  Started having nausea, vomiting, diarrhea.  Said diarrhea is brown.  Also with diffuse abdominal pain that is worse in lower quadrants.  No fever, cough, chest pain, shortness of breath.  Per EMS, she had 2 episodes of nausea vomiting with them.  Symptoms all started today.  Independent history obtained from EMS.   On independent chart review, she was seen by gastroenterology in mid February, has history of esophageal dysphagia, had an EGD as well as colonoscopy done in mid February that showed normal esophagus, large paraesophageal hernia.  For a colonoscopy, tortuous colon was noted and procedure was aborted due to restricted mobility of the colon, they did note diverticulosis in the sigmoid colon as well as nonbleeding internal hemorrhoids.  Has a CT colonography scheduled for 3/26.  Physical Exam   Triage Vital Signs: ED Triage Vitals  Encounter Vitals Group     BP      Systolic BP Percentile      Diastolic BP Percentile      Pulse      Resp      Temp      Temp src      SpO2      Weight      Height      Head Circumference      Peak Flow      Pain Score      Pain Loc      Pain Education      Exclude from Growth Chart     Most recent vital signs: Vitals:   08/31/23 2137 08/31/23 2138  BP:  (!) 146/64  Pulse:  86  Resp:  17  Temp:  97.7 F (36.5 C)  SpO2: 98% 100%     General: Awake, no distress.  CV:  Good peripheral perfusion.  Resp:  Normal effort.  Abd:  No distention.  Soft, diffusely tender, worse in the bilateral lower quadrants.  No  guarding Other:  Dry mucous membranes.  No lower extremity edema.   ED Results / Procedures / Treatments   Labs (all labs ordered are listed, but only abnormal results are displayed) Labs Reviewed  COMPREHENSIVE METABOLIC PANEL - Abnormal; Notable for the following components:      Result Value   Sodium 132 (*)    Chloride 94 (*)    Glucose, Bld 100 (*)    BUN 24 (*)    Creatinine, Ser 1.23 (*)    GFR, Estimated 44 (*)    All other components within normal limits  CBC WITH DIFFERENTIAL/PLATELET - Abnormal; Notable for the following components:   WBC 12.4 (*)    Neutro Abs 10.5 (*)    Monocytes Absolute 1.2 (*)    All other components within normal limits  URINALYSIS, W/ REFLEX TO CULTURE (INFECTION SUSPECTED) - Abnormal; Notable for the following components:   Color, Urine YELLOW (*)    APPearance HAZY (*)    Glucose, UA 50 (*)    Hgb urine dipstick SMALL (*)    Ketones, ur 5 (*)    Protein, ur 30 (*)  Leukocytes,Ua SMALL (*)    All other components within normal limits  RESP PANEL BY RT-PCR (RSV, FLU A&B, COVID)  RVPGX2  LACTIC ACID, PLASMA  LIPASE, BLOOD  LACTIC ACID, PLASMA  TROPONIN I (HIGH SENSITIVITY)     EKG  Sinus rhythm, rate 82, normal QRS, normal QTc, baseline is wandering but no ischemic ST elevation, T wave flattening to aVL, not significantly changed compared to prior   PROCEDURES:  Critical Care performed: No  Procedures   MEDICATIONS ORDERED IN ED: Medications  iohexol (OMNIPAQUE) 9 MG/ML oral solution 500 mL (0 mLs Oral Hold 08/31/23 2238)  iohexol (OMNIPAQUE) 300 MG/ML solution 75 mL (has no administration in time range)  ondansetron (ZOFRAN) injection 4 mg (4 mg Intravenous Given 08/31/23 2158)  morphine (PF) 2 MG/ML injection 2 mg (2 mg Intravenous Given 08/31/23 2158)  lactated ringers bolus 500 mL (0 mLs Intravenous Stopped 08/31/23 2234)     IMPRESSION / MDM / ASSESSMENT AND PLAN / ED COURSE  I reviewed the triage vital signs and the  nursing notes.                              Differential diagnosis includes, but is not limited to, reaction to the bowel prep, SBO, partial SBO, gastroenteritis, colitis, diverticulitis.  Will get labs, EKG, troponin, CT abdomen pelvis, fluids, respiratory viral panel, will give her some IV fluids, IV morphine, IV Zofran.  Patient's presentation is most consistent with acute presentation with potential threat to life or bodily function.  Independent review of labs are below.  Patient signed out to overnight team pending CT imaging results, reassessment.  If negative and feeling symptomatically better, likely able to be discharged home.  Clinical Course as of 08/31/23 2301  Tue Aug 31, 2023  2242 Independent review of labs, troponin is normal, lactate is normal, lipase is not elevated, she is mild hyponatremia, mild AKI, LFTs are normal, UA not consistent with UTI, she has mild leukocytosis. [TT]    Clinical Course User Index [TT] Jodie Echevaria Franchot Erichsen, MD     FINAL CLINICAL IMPRESSION(S) / ED DIAGNOSES   Final diagnoses:  Generalized abdominal pain  Nausea vomiting and diarrhea     Rx / DC Orders   ED Discharge Orders     None        Note:  This document was prepared using Dragon voice recognition software and may include unintentional dictation errors.    Claybon Jabs, MD 08/31/23 [phone]

## 2023-08-31 NOTE — ED Provider Notes (Incomplete)
-----------------------------------------   11:40 PM on 08/31/2023 -----------------------------------------   CT abd/pel: Long segment of inflammatory change in the rectosigmoid consistent  with focal colitis. The more proximal colon is mildly dilated.    Prominent rugal folds within the stomach which may represent a  degree of gastritis. Large hiatal hernia is again noted.   Patient actively vomiting complaining of pain.  Given her myriad of symptoms, flu positive, UTI, CT findings, will initiate treatment with Tamiflu, IV antibiotics and consult hospitalist services for evaluation and admission.

## 2023-09-01 ENCOUNTER — Inpatient Hospital Stay: Admission: RE | Admit: 2023-09-01 | Payer: Medicare Other | Source: Ambulatory Visit

## 2023-09-01 ENCOUNTER — Encounter: Payer: Self-pay | Admitting: Family Medicine

## 2023-09-01 DIAGNOSIS — E876 Hypokalemia: Secondary | ICD-10-CM | POA: Diagnosis present

## 2023-09-01 DIAGNOSIS — Z885 Allergy status to narcotic agent status: Secondary | ICD-10-CM | POA: Diagnosis not present

## 2023-09-01 DIAGNOSIS — F32A Depression, unspecified: Secondary | ICD-10-CM | POA: Diagnosis present

## 2023-09-01 DIAGNOSIS — E878 Other disorders of electrolyte and fluid balance, not elsewhere classified: Secondary | ICD-10-CM | POA: Diagnosis present

## 2023-09-01 DIAGNOSIS — J101 Influenza due to other identified influenza virus with other respiratory manifestations: Secondary | ICD-10-CM | POA: Diagnosis present

## 2023-09-01 DIAGNOSIS — K529 Noninfective gastroenteritis and colitis, unspecified: Secondary | ICD-10-CM | POA: Diagnosis present

## 2023-09-01 DIAGNOSIS — N390 Urinary tract infection, site not specified: Secondary | ICD-10-CM

## 2023-09-01 DIAGNOSIS — I509 Heart failure, unspecified: Secondary | ICD-10-CM | POA: Diagnosis present

## 2023-09-01 DIAGNOSIS — K219 Gastro-esophageal reflux disease without esophagitis: Secondary | ICD-10-CM | POA: Diagnosis present

## 2023-09-01 DIAGNOSIS — E785 Hyperlipidemia, unspecified: Secondary | ICD-10-CM | POA: Insufficient documentation

## 2023-09-01 DIAGNOSIS — E782 Mixed hyperlipidemia: Secondary | ICD-10-CM | POA: Diagnosis present

## 2023-09-01 DIAGNOSIS — Z90710 Acquired absence of both cervix and uterus: Secondary | ICD-10-CM | POA: Diagnosis not present

## 2023-09-01 DIAGNOSIS — A419 Sepsis, unspecified organism: Secondary | ICD-10-CM

## 2023-09-01 DIAGNOSIS — E860 Dehydration: Secondary | ICD-10-CM | POA: Diagnosis present

## 2023-09-01 DIAGNOSIS — Q438 Other specified congenital malformations of intestine: Secondary | ICD-10-CM

## 2023-09-01 DIAGNOSIS — Z888 Allergy status to other drugs, medicaments and biological substances status: Secondary | ICD-10-CM | POA: Diagnosis not present

## 2023-09-01 DIAGNOSIS — N179 Acute kidney failure, unspecified: Secondary | ICD-10-CM

## 2023-09-01 DIAGNOSIS — R112 Nausea with vomiting, unspecified: Secondary | ICD-10-CM | POA: Diagnosis not present

## 2023-09-01 DIAGNOSIS — N1831 Chronic kidney disease, stage 3a: Secondary | ICD-10-CM | POA: Diagnosis present

## 2023-09-01 DIAGNOSIS — F3289 Other specified depressive episodes: Secondary | ICD-10-CM

## 2023-09-01 DIAGNOSIS — Z79899 Other long term (current) drug therapy: Secondary | ICD-10-CM | POA: Diagnosis not present

## 2023-09-01 DIAGNOSIS — E871 Hypo-osmolality and hyponatremia: Secondary | ICD-10-CM | POA: Diagnosis present

## 2023-09-01 DIAGNOSIS — M48062 Spinal stenosis, lumbar region with neurogenic claudication: Secondary | ICD-10-CM | POA: Diagnosis present

## 2023-09-01 DIAGNOSIS — R197 Diarrhea, unspecified: Secondary | ICD-10-CM | POA: Diagnosis not present

## 2023-09-01 DIAGNOSIS — R109 Unspecified abdominal pain: Secondary | ICD-10-CM | POA: Diagnosis not present

## 2023-09-01 DIAGNOSIS — K5730 Diverticulosis of large intestine without perforation or abscess without bleeding: Secondary | ICD-10-CM | POA: Diagnosis present

## 2023-09-01 DIAGNOSIS — K55039 Acute (reversible) ischemia of large intestine, extent unspecified: Secondary | ICD-10-CM | POA: Diagnosis present

## 2023-09-01 DIAGNOSIS — A09 Infectious gastroenteritis and colitis, unspecified: Secondary | ICD-10-CM | POA: Diagnosis present

## 2023-09-01 DIAGNOSIS — Z791 Long term (current) use of non-steroidal anti-inflammatories (NSAID): Secondary | ICD-10-CM | POA: Diagnosis not present

## 2023-09-01 DIAGNOSIS — K648 Other hemorrhoids: Secondary | ICD-10-CM | POA: Diagnosis present

## 2023-09-01 DIAGNOSIS — K5900 Constipation, unspecified: Secondary | ICD-10-CM | POA: Diagnosis present

## 2023-09-01 DIAGNOSIS — K449 Diaphragmatic hernia without obstruction or gangrene: Secondary | ICD-10-CM | POA: Diagnosis present

## 2023-09-01 LAB — CBC
HCT: 34.3 % — ABNORMAL LOW (ref 36.0–46.0)
Hemoglobin: 10.8 g/dL — ABNORMAL LOW (ref 12.0–15.0)
MCH: 30.5 pg (ref 26.0–34.0)
MCHC: 31.5 g/dL (ref 30.0–36.0)
MCV: 96.9 fL (ref 80.0–100.0)
Platelets: 206 10*3/uL (ref 150–400)
RBC: 3.54 MIL/uL — ABNORMAL LOW (ref 3.87–5.11)
RDW: 14 % (ref 11.5–15.5)
WBC: 13.1 10*3/uL — ABNORMAL HIGH (ref 4.0–10.5)
nRBC: 0 % (ref 0.0–0.2)

## 2023-09-01 LAB — PROTIME-INR
INR: 1.1 (ref 0.8–1.2)
Prothrombin Time: 14.4 s (ref 11.4–15.2)

## 2023-09-01 LAB — GASTROINTESTINAL PANEL BY PCR, STOOL (REPLACES STOOL CULTURE)

## 2023-09-01 LAB — BASIC METABOLIC PANEL
Anion gap: 9 (ref 5–15)
BUN: 21 mg/dL (ref 8–23)
CO2: 25 mmol/L (ref 22–32)
Calcium: 8.7 mg/dL — ABNORMAL LOW (ref 8.9–10.3)
Chloride: 98 mmol/L (ref 98–111)
Creatinine, Ser: 0.99 mg/dL (ref 0.44–1.00)
GFR, Estimated: 58 mL/min — ABNORMAL LOW (ref >60)
Glucose, Bld: 102 mg/dL — ABNORMAL HIGH (ref 70–99)
Potassium: 3.3 mmol/L — ABNORMAL LOW (ref 3.5–5.1)
Sodium: 132 mmol/L — ABNORMAL LOW (ref 135–145)

## 2023-09-01 LAB — C DIFFICILE QUICK SCREEN W PCR REFLEX
C Diff antigen: NEGATIVE
C Diff interpretation: NOT DETECTED
C Diff toxin: NEGATIVE

## 2023-09-01 LAB — TROPONIN I (HIGH SENSITIVITY): Troponin I (High Sensitivity): 8 ng/L (ref <18)

## 2023-09-01 LAB — CORTISOL-AM, BLOOD: Cortisol - AM: 31 ug/dL — ABNORMAL HIGH (ref 6.7–22.6)

## 2023-09-01 MED ORDER — SODIUM CHLORIDE 0.9 % IV SOLN
12.5000 mg | Freq: Four times a day (QID) | INTRAVENOUS | Status: DC | PRN
  Administered 2023-09-01 (×2): 12.5 mg via INTRAVENOUS
  Filled 2023-09-01: qty 12.5
  Filled 2023-09-01: qty 0.5

## 2023-09-01 MED ORDER — METRONIDAZOLE 500 MG/100ML IV SOLN: 500.0000 mg | Freq: Two times a day (BID) | INTRAVENOUS | Status: DC

## 2023-09-01 MED ORDER — ACETAMINOPHEN 650 MG RE SUPP: 650.0000 mg | Freq: Four times a day (QID) | RECTAL | Status: DC | PRN

## 2023-09-01 MED ORDER — OSELTAMIVIR PHOSPHATE 30 MG PO CAPS
30.0000 mg | ORAL_CAPSULE | Freq: Every day | ORAL | Status: DC
  Administered 2023-09-01 – 2023-09-03 (×3): 30 mg via ORAL
  Filled 2023-09-01 (×3): qty 1

## 2023-09-01 MED ORDER — LACTATED RINGERS IV SOLN
150.0000 mL/h | INTRAVENOUS | Status: AC
  Administered 2023-09-01 (×2): 150 mL/h via INTRAVENOUS

## 2023-09-01 MED ORDER — SERTRALINE HCL 50 MG PO TABS
100.0000 mg | ORAL_TABLET | Freq: Every day | ORAL | Status: DC
  Administered 2023-09-01 – 2023-09-03 (×3): 100 mg via ORAL
  Filled 2023-09-01 (×3): qty 2

## 2023-09-01 MED ORDER — POTASSIUM CHLORIDE CRYS ER 20 MEQ PO TBCR
40.0000 meq | EXTENDED_RELEASE_TABLET | Freq: Once | ORAL | Status: AC
  Administered 2023-09-01: 40 meq via ORAL
  Filled 2023-09-01: qty 2

## 2023-09-01 MED ORDER — VITAMIN B-12 100 MCG PO TABS
100.0000 ug | ORAL_TABLET | Freq: Every day | ORAL | Status: DC
  Administered 2023-09-01 – 2023-09-03 (×3): 100 ug via ORAL
  Filled 2023-09-01 (×3): qty 1

## 2023-09-01 MED ORDER — OMEGA-3-ACID ETHYL ESTERS 1 G PO CAPS
2.0000 g | ORAL_CAPSULE | Freq: Two times a day (BID) | ORAL | Status: DC
  Administered 2023-09-01 – 2023-09-03 (×5): 2 g via ORAL
  Filled 2023-09-01 (×5): qty 2

## 2023-09-01 MED ORDER — OYSTER SHELL CALCIUM/D3 500-5 MG-MCG PO TABS
1.0000 | ORAL_TABLET | Freq: Two times a day (BID) | ORAL | Status: DC
  Administered 2023-09-01 – 2023-09-03 (×5): 1 via ORAL
  Filled 2023-09-01 (×5): qty 1

## 2023-09-01 MED ORDER — OXYCODONE HCL 5 MG PO TABS
5.0000 mg | ORAL_TABLET | Freq: Four times a day (QID) | ORAL | Status: DC | PRN
  Administered 2023-09-01: 5 mg via ORAL
  Filled 2023-09-01 (×2): qty 1

## 2023-09-01 MED ORDER — PANTOPRAZOLE SODIUM 40 MG PO TBEC
40.0000 mg | DELAYED_RELEASE_TABLET | Freq: Every day | ORAL | Status: DC
  Administered 2023-09-01 – 2023-09-03 (×3): 40 mg via ORAL
  Filled 2023-09-01 (×3): qty 1

## 2023-09-01 MED ORDER — CYCLOBENZAPRINE HCL 10 MG PO TABS
5.0000 mg | ORAL_TABLET | Freq: Every day | ORAL | Status: DC
  Administered 2023-09-01 – 2023-09-03 (×3): 5 mg via ORAL
  Filled 2023-09-01 (×3): qty 1

## 2023-09-01 MED ORDER — ENOXAPARIN SODIUM 30 MG/0.3ML IJ SOSY
30.0000 mg | PREFILLED_SYRINGE | INTRAMUSCULAR | Status: DC
  Administered 2023-09-01 – 2023-09-03 (×3): 30 mg via SUBCUTANEOUS
  Filled 2023-09-01 (×3): qty 0.3

## 2023-09-01 MED ORDER — ACETAMINOPHEN 325 MG PO TABS
650.0000 mg | ORAL_TABLET | Freq: Four times a day (QID) | ORAL | Status: DC | PRN
  Administered 2023-09-03: 650 mg via ORAL
  Filled 2023-09-01: qty 2

## 2023-09-01 MED ORDER — SODIUM CHLORIDE 0.9 % IV SOLN: 2.0000 g | INTRAVENOUS | Status: DC

## 2023-09-01 MED ORDER — PIPERACILLIN-TAZOBACTAM 3.375 G IVPB
3.3750 g | Freq: Three times a day (TID) | INTRAVENOUS | Status: DC
  Administered 2023-09-01 – 2023-09-03 (×7): 3.375 g via INTRAVENOUS
  Filled 2023-09-01 (×6): qty 50

## 2023-09-01 MED ORDER — PRAVASTATIN SODIUM 20 MG PO TABS
20.0000 mg | ORAL_TABLET | Freq: Every day | ORAL | Status: DC
  Administered 2023-09-01 – 2023-09-02 (×2): 20 mg via ORAL
  Filled 2023-09-01 (×2): qty 1

## 2023-09-01 MED ORDER — ONDANSETRON HCL 4 MG/2ML IJ SOLN
4.0000 mg | Freq: Four times a day (QID) | INTRAMUSCULAR | Status: DC | PRN
  Administered 2023-09-01: 4 mg via INTRAVENOUS
  Filled 2023-09-01: qty 2

## 2023-09-01 MED ORDER — ONDANSETRON HCL 4 MG PO TABS: 4.0000 mg | ORAL_TABLET | Freq: Four times a day (QID) | ORAL | Status: DC | PRN

## 2023-09-01 MED ORDER — TRAZODONE HCL 50 MG PO TABS: 25.0000 mg | ORAL_TABLET | Freq: Every evening | ORAL | Status: DC | PRN

## 2023-09-01 NOTE — ED Notes (Addendum)
 Pt ABCs intact. RR even and unlabored on 3lpm via [place]. Pt in NAD. Bed in lowest locked position. Call bell in reach. Denies needs at this time.

## 2023-09-01 NOTE — Progress Notes (Signed)
 Patient dropped Oxycodone in  floor,wasted with charge nurse Bre and pulled another Oxycodone for patient.

## 2023-09-01 NOTE — Assessment & Plan Note (Addendum)
 On pravastatin.

## 2023-09-01 NOTE — ED Notes (Signed)
 In patient room to assess and given zofran for nausea. Noticed left arm swollen around iv site. Pt denies any pain. IV Fluids stopped, IV removed and coban applied to arm. Pt states arm feels sore but denies pain. PT unhooked and ambulated to bathroom with nurse by side and steady gait.

## 2023-09-01 NOTE — Assessment & Plan Note (Addendum)
 Urine culture pending

## 2023-09-01 NOTE — Progress Notes (Signed)
 Stool sample sent to lab

## 2023-09-01 NOTE — Consult Note (Signed)
 Midge Minium, MD [facility]  [address] [place], [place] [address] Phone: [phone] Fax : [phone]  Consultation  Referring Provider:     Dr. Renae Gloss Primary Care Physician:  Danella Penton, MD Primary Gastroenterologist:  Dr. Norma Fredrickson         Reason for Consultation:     Diarrhea and abdominal pain  Date of Admission:  08/31/2023 Date of Consultation:  09/01/2023         HPI:   Vanessa Cisneros is a 78 y.o. female who has a history of having a colonoscopy by Dr. Norma Fredrickson on February 12 of this year.  At that time the patient was unable to have the exam completed due to a narrowing in the sigmoid colon.  The patient also had an upper endoscopy at that time.  The patient was supposed to have a CT colonoscopy today and start her prep on Monday.  The patient states that after she started taking the prep she started to have profuse diarrhea that would not stop with some nausea and episode of vomiting.  The patient had a CT scan on admission that showed:  IMPRESSION: Long segment of inflammatory change in the rectosigmoid consistent with focal colitis. The more proximal colon is mildly dilated.  Prominent rugal folds within the stomach which may represent a degree of gastritis. Large hiatal hernia is again noted.  Patient denies any recent antibiotic use.  She also denies ever having the symptoms previously.  The patient reports her abdominal pain to be in the lower abdomen.  She has had a C. difficile and GI panel ordered. On admission the patient's white cell count was elevated 12.4 that increased to 13.1.  Her hemoglobin was 12.2 on admission and this morning was 10.8 without any sign of any GI bleeding.  There is no report of any black stools or bloody stools. The patient has a history of CHF GERD hyperlipidemia and inflammatory polyarthropathy. The patient also had an upper endoscopy that showed a large paraesophageal hernia.  Past Medical History:  Diagnosis Date    Anemia    Arthritis    CHF (congestive heart failure) (HCC)    Depression    Esophageal reflux    GERD (gastroesophageal reflux disease)    Hyperlipidemia, mixed    Immunodeficiency (cell-mediated) (HCC)    Inflammatory polyarthropathy (HCC)    Lumbar stenosis with neurogenic claudication     Past Surgical History:  Procedure Laterality Date   ABDOMINAL HYSTERECTOMY     BREAST BIOPSY Left 06/08/2012   neg   CHOLECYSTECTOMY     COLONOSCOPY WITH PROPOFOL N/A 07/21/2023   Procedure: COLONOSCOPY WITH PROPOFOL;  Surgeon: Toledo, Boykin Nearing, MD;  Location: [facility] ENDOSCOPY;  Service: Gastroenterology;  Laterality: N/A;   ESOPHAGOGASTRODUODENOSCOPY (EGD) WITH PROPOFOL N/A 10/14/2015   Procedure: ESOPHAGOGASTRODUODENOSCOPY (EGD) WITH PROPOFOL;  Surgeon: Scot Jun, MD;  Location: [facility] ENDOSCOPY;  Service: Endoscopy;  Laterality: N/A;   ESOPHAGOGASTRODUODENOSCOPY (EGD) WITH PROPOFOL N/A 07/21/2023   Procedure: ESOPHAGOGASTRODUODENOSCOPY (EGD) WITH PROPOFOL;  Surgeon: Toledo, Boykin Nearing, MD;  Location: [facility] ENDOSCOPY;  Service: Gastroenterology;  Laterality: N/A;   TUMOR REMOVAL N/A    stomach    Prior to Admission medications   Medication Sig Start Date End Date Taking? Authorizing Provider  Calcium Carbonate-Vitamin D (CALCIUM 600 + D PO) Take 1 tablet by mouth 2 (two) times daily.     Yes [provider]  donepezil (ARICEPT) 5 MG tablet Take 1 tablet  by mouth at bedtime. 07/19/23 07/18/24 Yes [provider]  fish oil-omega-3 fatty acids 1000 MG capsule Take 2 g by mouth 2 (two) times daily.     Yes [provider]  fluticasone (FLONASE) 50 MCG/ACT nasal spray Place 2 sprays into both nostrils daily. 07/07/23  Yes [provider]  hydroxychloroquine (PLAQUENIL) 200 MG tablet Take 200 mg by mouth daily.   Yes [provider]  lovastatin (MEVACOR) 20 MG tablet Take 20 mg by mouth at bedtime.     Yes [provider]  omeprazole (PRILOSEC)  20 MG capsule Take 20 mg by mouth daily.     Yes [provider]  sucralfate (CARAFATE) 1 g tablet Take 1 g by mouth 2 (two) times daily.   Yes [provider]  torsemide (DEMADEX) 20 MG tablet Take 20 mg by mouth daily.     Yes [provider]  venlafaxine XR (EFFEXOR-XR) 75 MG 24 hr capsule Take 75 mg by mouth daily.   Yes [provider]  Cyanocobalamin (VITAMIN B-12 PO) Take 1 tablet by mouth daily. Reported on 10/14/2015 Patient not taking: Reported on 09/01/2023    [provider]  cyclobenzaprine (FLEXERIL) 5 MG tablet Take 5 mg by mouth daily. Reported on 10/14/2015 Patient not taking: Reported on 09/01/2023    [provider]  meloxicam (MOBIC) 15 MG tablet Take 15 mg by mouth daily. Reported on 10/14/2015 Patient not taking: Reported on 09/01/2023    [provider]  OVER THE COUNTER MEDICATION Take 1 tablet by mouth daily. Nutri-Calm vitamin  Patient not taking: Reported on 09/01/2023    [provider]  sertraline (ZOLOFT) 100 MG tablet Take 100 mg by mouth daily.   Patient not taking: Reported on 09/01/2023    [provider]    History reviewed. No pertinent family history.   Social History   Tobacco Use   Smoking status: Never  Vaping Use   Vaping status: Never Used  Substance Use Topics   Alcohol use: No   Drug use: Never    Allergies as of 08/31/2023 - Review Complete 08/31/2023  Allergen Reaction Noted   Levofloxacin  05/25/2011   Tramadol  07/13/2023    Review of Systems:    All systems reviewed and negative except where noted in HPI.   Physical Exam:  Vital signs in last 24 hours: Temp:  [97.6 F (36.4 C)-98.1 F (36.7 C)] 98.1 F (36.7 C) (03/26 1322) Pulse Rate:  [77-91] 79 (03/26 1322) Resp:  [16-19] 18 (03/26 1322) BP: (113-153)/(57-87) 141/60 (03/26 1322) SpO2:  [83 %-100 %] 96 % (03/26 1322) Weight:  [38.6 kg] 38.6 kg (03/25 2141)   General:   Pleasant, cooperative in  NAD Head:  Normocephalic and atraumatic. Eyes:   No icterus.   Conjunctiva pink. PERRLA. Ears:  Normal auditory acuity. Neck:  Supple; no masses or thyroidomegaly Lungs: Respirations even and unlabored. Lungs clear to auscultation bilaterally.   No wheezes, crackles, or rhonchi.  Heart:  Regular rate and rhythm;  Without murmur, clicks, rubs or gallops Abdomen:  Soft, nondistended, mild diffuse tenderness. Normal bowel sounds. No appreciable masses or hepatomegaly.  No rebound or guarding.  Rectal:  Not performed. Msk:  Symmetrical without gross deformities.    Extremities:  Without edema, cyanosis or clubbing. Neurologic:  Alert and oriented x3;  grossly normal neurologically. Skin:  Intact without significant lesions or rashes. Cervical Nodes:  No significant cervical adenopathy. Psych:  Alert and cooperative. Normal affect.  LAB RESULTS: Recent Labs    08/31/23 2144 09/01/23 0550  WBC 12.4* 13.1*  HGB 12.2 10.8*  HCT 38.0 34.3*  PLT 242 206   BMET Recent Labs    08/31/23 2144 09/01/23 0550  NA 132* 132*  K 3.7 3.3*  CL 94* 98  CO2 26 25  GLUCOSE 100* 102*  BUN 24* 21  CREATININE 1.23* 0.99  CALCIUM 9.7 8.7*   LFT Recent Labs    08/31/23 2144  PROT 7.2  ALBUMIN 3.9  AST 24  ALT 17  ALKPHOS 43  BILITOT 0.5   PT/INR Recent Labs    09/01/23 0550  LABPROT 14.4  INR 1.1    STUDIES: CT ABDOMEN PELVIS W CONTRAST Result Date: 08/31/2023 CLINICAL DATA:  Left lower quadrant pain EXAM: CT ABDOMEN AND PELVIS WITH CONTRAST TECHNIQUE: Multidetector CT imaging of the abdomen and pelvis was performed using the standard protocol following bolus administration of intravenous contrast. RADIATION DOSE REDUCTION: This exam was performed according to the departmental dose-optimization program which includes automated exposure control, adjustment of the mA and/or kV according to patient size and/or use of iterative reconstruction technique. CONTRAST:  75mL OMNIPAQUE IOHEXOL  300 MG/ML  SOLN COMPARISON:  None Available. FINDINGS: Lower chest: Lung bases are free of acute infiltrate or sizable effusion. Large hiatal hernia is noted with the majority of the stomach within the chest cavity. Hepatobiliary: No focal liver abnormality is seen. Status post cholecystectomy. No biliary dilatation. Pancreas: Unremarkable. No pancreatic ductal dilatation or surrounding inflammatory changes. Spleen: Normal in size without focal abnormality. Adrenals/Urinary Tract: Adrenal glands are within normal limits. Kidneys demonstrate a normal enhancement pattern bilaterally. Bladder is well distended. Stomach/Bowel: Considerable wall thickening is noted within the rectosigmoid region consistent with focal colitis. No discrete mass is seen. The more proximal colon is distended with air and contrast material likely from recent fluoroscopic study. Large hiatal hernia is again identified. The rugal folds are prominent likely representing a degree of gastritis. The small bowel shows no obstructive change. Vascular/Lymphatic: Aortic atherosclerosis. No enlarged abdominal or pelvic lymph nodes. Reproductive: Status post hysterectomy. No adnexal masses. Other: No abdominal wall hernia or abnormality. No abdominopelvic ascites. Musculoskeletal: Degenerative changes of lumbar spine are noted. IMPRESSION: Long segment of inflammatory change in the rectosigmoid consistent with focal colitis. The more proximal colon is mildly dilated. Prominent rugal folds within the stomach which may represent a degree of gastritis. Large hiatal hernia is again noted. Electronically Signed   By: Alcide Clever M.D.   On: 08/31/2023 23:21      Impression / Plan:   Assessment: Principal Problem:   Acute colitis Active Problems:   AKI (acute kidney injury) (HCC)   Acute lower UTI   Dyslipidemia   GERD without esophagitis   Influenza A   Depression   Sepsis due to undetermined organism (HCC)   Hypokalemia   Vanessa Cisneros is a 78 y.o. y/o female with a report of nausea vomiting abdominal pain after starting a prep for a CT colonoscopy after having an incomplete colonoscopy last month due to a tortuous colon.  The patient states that she started taking the prep and started having the symptoms that would not go away predominantly being diarrhea with abdominal cramps.  The patient's differential diagnosis includes ischemic colitis due to the preparation with the stricture causing distention of the colon and creased blood flow versus infectious colitis which is less likely due to the segmental distribution.  Plan:  I agree the patient  should be treated symptomatically and is already on Zosyn and pantoprazole in addition to having stool sent off for pathogens.  The patient does not need any endoscopic goal luminal evaluation at this time.  I would continue conservative therapy until her stool tests are back and then target accordingly.  The patient has been explained the plan and agrees with it.  Thank you for involving me in the care of this patient.      LOS: 0 days   Midge Minium, MD, MD. Clementeen Graham 09/01/2023, 2:35 PM,  Pager [phone] 7am-5pm  Check AMION for 5pm -7am coverage and on weekends   Note: This dictation was prepared with Dragon dictation along with smaller phrase technology. Any transcriptional errors that result from this process are unintentional.

## 2023-09-01 NOTE — Assessment & Plan Note (Deleted)
 Continue Zoloft

## 2023-09-01 NOTE — Assessment & Plan Note (Deleted)
 Creatinine improved from 1.23 down to 0.99

## 2023-09-01 NOTE — Assessment & Plan Note (Addendum)
 The patient has subsequent sepsis as manifested by tachypnea and leukocytosis. Could be ischemic versus infectious.  Stool studies negative.  Empiric Zosyn.  Reviewed prior colonoscopy where they could not pass a pediatric scope through the thickening of her colon.  Patient has been having diarrhea since.  Patient will need a virtual colonoscopy as outpatient.

## 2023-09-01 NOTE — Plan of Care (Signed)

## 2023-09-01 NOTE — ED Notes (Signed)
 MD Mansy notified of patient continuously vomiting.

## 2023-09-01 NOTE — H&P (Signed)
 [place]   PATIENT NAME: Vanessa Cisneros    MR#:  [id number]  DATE OF BIRTH:  [birth year]  DATE OF ADMISSION:  08/31/2023  PRIMARY CARE PHYSICIAN: Danella Penton, MD   Patient is coming from: Home  REQUESTING/REFERRING PHYSICIAN: Chiquita Loth, MD  CHIEF COMPLAINT:   Chief Complaint  Patient presents with   Nausea    HISTORY OF PRESENT ILLNESS:  Vanessa Cisneros is a 78 y.o. Caucasian female with medical history significant for CHF, depression, GERD, dyslipidemia and lumbar stenosis, who presented to the emergency room with acute onset of severe abdominal pain with associated nausea, and vomiting.  She has been getting a colonoscopy prep and therefore has been having diarrhea.  She admitted to chills without reported fever.  No cough or wheezing or dyspnea.  No chest pain or palpitations.  No dysuria, oliguria or hematuria or flank pain.  No bleeding diathesis.  ED Course: When she came to the ER, BP was 146/64 with otherwise normal vital signs.  Labs revealed mild hyponatremia and hypochloremia, BUN of 24 and creatinine 1.23 and otherwise unremarkable CMP.  High-sensitivity troponin I was 6 and later 8.  CBC showed leukocytosis of 12.4.  UA showed 6-10 WBCs with no bacteria and small leukocytes.  Influenza A came back positive and respiratory panel was otherwise unremarkable. EKG as reviewed by me : EKG showed normal sinus rhythm with rate of 82 with low voltage QRS. Imaging: Abdominal and pelvic CT scan with contrast revealed the following: Long segment of inflammatory change in the rectosigmoid consistent with focal colitis. The more proximal colon is mildly dilated.   Prominent rugal folds within the stomach which may represent a degree of gastritis. Large hiatal hernia is again noted.  The patient was given morphine sulfate, IV Zofran, 30 mg p.o. Tamiflu, IV Zosyn and 500 mL IV lactated ringer bolus.  She will be admitted to a medical telemetry bed for further evaluation  and management PAST MEDICAL HISTORY:   Past Medical History:  Diagnosis Date   Anemia    Arthritis    CHF (congestive heart failure) (HCC)    Depression    Esophageal reflux    GERD (gastroesophageal reflux disease)    Hyperlipidemia, mixed    Immunodeficiency (cell-mediated) (HCC)    Inflammatory polyarthropathy (HCC)    Lumbar stenosis with neurogenic claudication     PAST SURGICAL HISTORY:   Past Surgical History:  Procedure Laterality Date   ABDOMINAL HYSTERECTOMY     BREAST BIOPSY Left 06/08/2012   neg   CHOLECYSTECTOMY     COLONOSCOPY WITH PROPOFOL N/A 07/21/2023   Procedure: COLONOSCOPY WITH PROPOFOL;  Surgeon: Toledo, Boykin Nearing, MD;  Location: [facility] ENDOSCOPY;  Service: Gastroenterology;  Laterality: N/A;   ESOPHAGOGASTRODUODENOSCOPY (EGD) WITH PROPOFOL N/A 10/14/2015   Procedure: ESOPHAGOGASTRODUODENOSCOPY (EGD) WITH PROPOFOL;  Surgeon: Scot Jun, MD;  Location: [facility] ENDOSCOPY;  Service: Endoscopy;  Laterality: N/A;   ESOPHAGOGASTRODUODENOSCOPY (EGD) WITH PROPOFOL N/A 07/21/2023   Procedure: ESOPHAGOGASTRODUODENOSCOPY (EGD) WITH PROPOFOL;  Surgeon: Toledo, Boykin Nearing, MD;  Location: [facility] ENDOSCOPY;  Service: Gastroenterology;  Laterality: N/A;   TUMOR REMOVAL N/A    stomach    SOCIAL HISTORY:   Social History   Tobacco Use   Smoking status: Never   Smokeless tobacco: Not on file  Substance Use Topics   Alcohol use: No    FAMILY HISTORY:  History reviewed. No pertinent family history.  DRUG ALLERGIES:   Allergies  Allergen Reactions  Levofloxacin     Patient stated that it makes her extremely sore, and she can hardly move.    Tramadol     REVIEW OF SYSTEMS:   ROS As per history of present illness. All pertinent systems were reviewed above. Constitutional, HEENT, cardiovascular, respiratory, GI, GU, musculoskeletal, neuro, psychiatric, endocrine, integumentary and hematologic systems were reviewed and are otherwise negative/unremarkable except  for positive findings mentioned above in the HPI.   MEDICATIONS AT HOME:   Prior to Admission medications   Medication Sig Start Date End Date Taking? Authorizing Provider  Calcium Carbonate-Vitamin D (CALCIUM 600 + D PO) Take 1 tablet by mouth 2 (two) times daily.      [provider]  Cyanocobalamin (VITAMIN B-12 PO) Take 1 tablet by mouth daily. Reported on 10/14/2015    [provider]  cyclobenzaprine (FLEXERIL) 5 MG tablet Take 5 mg by mouth daily. Reported on 10/14/2015    [provider]  fish oil-omega-3 fatty acids 1000 MG capsule Take 2 g by mouth 2 (two) times daily.      [provider]  lovastatin (MEVACOR) 20 MG tablet Take 20 mg by mouth at bedtime.      [provider]  meloxicam (MOBIC) 15 MG tablet Take 15 mg by mouth daily. Reported on 10/14/2015    [provider]  omeprazole (PRILOSEC) 20 MG capsule Take 20 mg by mouth daily.      [provider]  OVER THE COUNTER MEDICATION Take 1 tablet by mouth daily. Nutri-Calm vitamin     [provider]  sertraline (ZOLOFT) 100 MG tablet Take 100 mg by mouth daily.      [provider]  torsemide (DEMADEX) 20 MG tablet Take 20 mg by mouth daily.      [provider]      VITAL SIGNS:  Blood pressure (!) 153/87, pulse 82, temperature 97.6 F (36.4 C), temperature source Oral, resp. rate 17, height 5' (1.524 m), weight 38.6 kg, SpO2 100%.  PHYSICAL EXAMINATION:  Physical Exam  GENERAL:  78 y.o.-year-old Caucasian female patient lying in the bed with no acute distress.  EYES: Pupils equal, round, reactive to light and accommodation. No scleral icterus. Extraocular muscles intact.  HEENT: Head atraumatic, normocephalic. Oropharynx and nasopharynx clear.  NECK:  Supple, no jugular venous distention. No thyroid enlargement, no tenderness.  LUNGS: Normal breath sounds bilaterally, no wheezing, rales,rhonchi or crepitation. No use of accessory  muscles of respiration.  CARDIOVASCULAR: Regular rate and rhythm, S1, S2 normal. No murmurs, rubs, or gallops.  ABDOMEN: Soft, nondistended, with generalized abdominal tenderness mainly in the left lower quadrant without rebound tenderness guarding or rigidity.. Bowel sounds present. No organomegaly or mass.  EXTREMITIES: No pedal edema, cyanosis, or clubbing.  NEUROLOGIC: Cranial nerves II through XII are intact. Muscle strength 5/5 in all extremities. Sensation intact. Gait not checked.  PSYCHIATRIC: The patient is alert and oriented x 3.  Normal affect and good eye contact. SKIN: No obvious rash, lesion, or ulcer.   LABORATORY PANEL:   CBC Recent Labs  Lab 09/01/23 0550  WBC 13.1*  HGB 10.8*  HCT 34.3*  PLT 206   ------------------------------------------------------------------------------------------------------------------  Chemistries  Recent Labs  Lab 08/31/23 2144 09/01/23 0550  NA 132* 132*  K 3.7 3.3*  CL 94* 98  CO2 26 25  GLUCOSE 100* 102*  BUN 24* 21  CREATININE 1.23* 0.99  CALCIUM 9.7 8.7*  AST 24  --   ALT 17  --  ALKPHOS 43  --   BILITOT 0.5  --    ------------------------------------------------------------------------------------------------------------------  Cardiac Enzymes No results for input(s): "TROPONINI" in the last 168 hours. ------------------------------------------------------------------------------------------------------------------  RADIOLOGY:  CT ABDOMEN PELVIS W CONTRAST Result Date: 08/31/2023 CLINICAL DATA:  Left lower quadrant pain EXAM: CT ABDOMEN AND PELVIS WITH CONTRAST TECHNIQUE: Multidetector CT imaging of the abdomen and pelvis was performed using the standard protocol following bolus administration of intravenous contrast. RADIATION DOSE REDUCTION: This exam was performed according to the departmental dose-optimization program which includes automated exposure control, adjustment of the mA and/or kV according to patient  size and/or use of iterative reconstruction technique. CONTRAST:  75mL OMNIPAQUE IOHEXOL 300 MG/ML  SOLN COMPARISON:  None Available. FINDINGS: Lower chest: Lung bases are free of acute infiltrate or sizable effusion. Large hiatal hernia is noted with the majority of the stomach within the chest cavity. Hepatobiliary: No focal liver abnormality is seen. Status post cholecystectomy. No biliary dilatation. Pancreas: Unremarkable. No pancreatic ductal dilatation or surrounding inflammatory changes. Spleen: Normal in size without focal abnormality. Adrenals/Urinary Tract: Adrenal glands are within normal limits. Kidneys demonstrate a normal enhancement pattern bilaterally. Bladder is well distended. Stomach/Bowel: Considerable wall thickening is noted within the rectosigmoid region consistent with focal colitis. No discrete mass is seen. The more proximal colon is distended with air and contrast material likely from recent fluoroscopic study. Large hiatal hernia is again identified. The rugal folds are prominent likely representing a degree of gastritis. The small bowel shows no obstructive change. Vascular/Lymphatic: Aortic atherosclerosis. No enlarged abdominal or pelvic lymph nodes. Reproductive: Status post hysterectomy. No adnexal masses. Other: No abdominal wall hernia or abnormality. No abdominopelvic ascites. Musculoskeletal: Degenerative changes of lumbar spine are noted. IMPRESSION: Long segment of inflammatory change in the rectosigmoid consistent with focal colitis. The more proximal colon is mildly dilated. Prominent rugal folds within the stomach which may represent a degree of gastritis. Large hiatal hernia is again noted. Electronically Signed   By: Alcide Clever M.D.   On: 08/31/2023 23:21      IMPRESSION AND PLAN:  Assessment and Plan: * Acute colitis - The patient has subsequent sepsis as manifested by tachypnea and leukocytosis. - She will be admitted to a medical telemetry bed. - Will  continue antibiotic therapy with IV Zosyn. - Pain management will be provided. - She will be hydrated with IV lactated ringer.    Influenza A - She has no significant respiratory symptoms. - Supportive management will be pursued as mentioned above. - We will continue Tamiflu.  AKI (acute kidney injury) (HCC) - This is prerenal secondary to volume depletion and dehydration from her colitis. - Hydrated with IV LR and will follow BMP. - Will avoid nephrotoxins.  Acute lower UTI - This is possible due to her pyuria. - She is covered with IV Zosyn as mentioned above. - We will follow urine culture and sensitivity.  Depression - We will continue Zoloft.  GERD without esophagitis Will continue PPI therapy.  Dyslipidemia - Continue statin therapy.       DVT prophylaxis: Lovenox.  Advanced Care Planning:  Code Status: full code.  Family Communication:  The plan of care was discussed in details with the patient (and family). I answered all questions. The patient agreed to proceed with the above mentioned plan. Further management will depend upon hospital course. Disposition Plan: Back to previous home environment Consults called: none.  All the records are reviewed and case discussed with ED provider.  Status is: Inpatient  At the time of the admission, it appears that the appropriate admission status for this patient is inpatient.  This is judged to be reasonable and necessary in order to provide the required intensity of service to ensure the patient's safety given the presenting symptoms, physical exam findings and initial radiographic and laboratory data in the context of comorbid conditions.  The patient requires inpatient status due to high intensity of service, high risk of further deterioration and high frequency of surveillance required.  I certify that at the time of admission, it is my clinical judgment that the patient will require inpatient hospital care extending  more than 2 midnights.                            Dispo: The patient is from: Home              Anticipated d/c is to: Home              Patient currently is not medically stable to d/c.              Difficult to place patient: No  Hannah Beat M.D on 09/01/2023 at 7:59 AM  [facility]   From 7 PM-7 AM, contact night-coverage [email or web address]  CC: Primary care physician; Danella Penton, MD

## 2023-09-01 NOTE — Assessment & Plan Note (Addendum)
 Will continue PPI therapy.

## 2023-09-01 NOTE — Assessment & Plan Note (Signed)
 Replaced

## 2023-09-01 NOTE — Assessment & Plan Note (Deleted)
-   She has no significant respiratory symptoms. - Supportive management will be pursued as mentioned above.

## 2023-09-01 NOTE — Hospital Course (Addendum)
 78 y.o. Caucasian female with medical history significant for CHF, depression, GERD, dyslipidemia and lumbar stenosis, who presented to the emergency room with acute onset of severe abdominal pain with associated nausea, and vomiting.  She has been getting a colonoscopy prep and therefore has been having diarrhea.  She admitted to chills without reported fever.  No cough or wheezing or dyspnea.  No chest pain or palpitations.  No dysuria, oliguria or hematuria or flank pain.  No bleeding diathesis.   ED Course: When she came to the ER, BP was 146/64 with otherwise normal vital signs.  Labs revealed mild hyponatremia and hypochloremia, BUN of 24 and creatinine 1.23 and otherwise unremarkable CMP.  High-sensitivity troponin I was 6 and later 8.  CBC showed leukocytosis of 12.4.  UA showed 6-10 WBCs with no bacteria and small leukocytes.  Influenza A came back positive and respiratory panel was otherwise unremarkable. EKG as reviewed by me : EKG showed normal sinus rhythm with rate of 82 with low voltage QRS. Imaging: Abdominal and pelvic CT scan with contrast revealed the following: Long segment of inflammatory change in the rectosigmoid consistent with focal colitis. The more proximal colon is mildly dilated.   Prominent rugal folds within the stomach which may represent a degree of gastritis. Large hiatal hernia is again noted.   The patient was given morphine sulfate, IV Zofran, 30 mg p.o. Tamiflu, IV Zosyn and 500 mL IV lactated ringer bolus.  She will be admitted to a medical telemetry bed for further evaluation and management.  3/26.  As per grandson she was drinking the prep for the virtual colonoscopy and then started vomiting and did not feel well.  Reviewed colonoscopy from 07/21/2023 and they could not pass a pediatric scope through.  No biopsies were taken.  3/27.  Patient still with poor appetite.  Not feeling well. 3/28.  Patient tolerating diet.  Still having some diarrhea but not a lot in  quantity.  Patient wants to go home and follow-up as outpatient.  Will prescribe Augmentin for to complete course.

## 2023-09-01 NOTE — Progress Notes (Addendum)
 Patient arrives to floor, c/o abdominal pain at a 7 on 1:10 scale and nausea. Messaged MD re: pain. New orders taken. Sent request to pharmacy for phenergan. Collection container placed in toilet for stool sample for GI panel. Patient instructed stool sample was needed.

## 2023-09-01 NOTE — Progress Notes (Signed)
 Progress Note   Patient: Vanessa Cisneros ZOX:[id number] DOB: 1945-09-29 DOA: 08/31/2023     0 DOS: the patient was seen and examined on 09/01/2023   Brief hospital course: 78 y.o. Caucasian female with medical history significant for CHF, depression, GERD, dyslipidemia and lumbar stenosis, who presented to the emergency room with acute onset of severe abdominal pain with associated nausea, and vomiting.  She has been getting a colonoscopy prep and therefore has been having diarrhea.  She admitted to chills without reported fever.  No cough or wheezing or dyspnea.  No chest pain or palpitations.  No dysuria, oliguria or hematuria or flank pain.  No bleeding diathesis.   ED Course: When she came to the ER, BP was 146/64 with otherwise normal vital signs.  Labs revealed mild hyponatremia and hypochloremia, BUN of 24 and creatinine 1.23 and otherwise unremarkable CMP.  High-sensitivity troponin I was 6 and later 8.  CBC showed leukocytosis of 12.4.  UA showed 6-10 WBCs with no bacteria and small leukocytes.  Influenza A came back positive and respiratory panel was otherwise unremarkable. EKG as reviewed by me : EKG showed normal sinus rhythm with rate of 82 with low voltage QRS. Imaging: Abdominal and pelvic CT scan with contrast revealed the following: Long segment of inflammatory change in the rectosigmoid consistent with focal colitis. The more proximal colon is mildly dilated.   Prominent rugal folds within the stomach which may represent a degree of gastritis. Large hiatal hernia is again noted.   The patient was given morphine sulfate, IV Zofran, 30 mg p.o. Tamiflu, IV Zosyn and 500 mL IV lactated ringer bolus.  She will be admitted to a medical telemetry bed for further evaluation and management.  3/26.  As per grandson she was drinking the prep for the virtual colonoscopy and then started vomiting and did not feel well.  Reviewed colonoscopy from 07/21/2023 and they could not pass a  pediatric scope through.  No biopsies were taken.    Assessment and Plan: * Acute colitis The patient has subsequent sepsis as manifested by tachypnea and leukocytosis. Could be ischemic versus infectious.  Send off stool studies.  Empiric Zosyn.  Reviewed prior colonoscopy where they could not pass a pediatric scope through the thickening of her colon.  Patient has been having diarrhea since.    Influenza A Tamiflu  AKI (acute kidney injury) (HCC) Creatinine improved from 1.23 down to 0.99  Acute lower UTI Try to add on urine culture since this was not done from the emergency room.  Currently on antibiotics.  Hypokalemia Replace potassium  Depression Continue Zoloft.  GERD without esophagitis Will continue PPI therapy.  Dyslipidemia On pravastatin        Subjective: Patient has been having diarrhea ever since her colonoscopy.  Came in with weakness and pains in her legs.  Normally she has constipation.  Also had vomiting prior to coming in.  Diagnosed with the flu.  Physical Exam: Vitals:   09/01/23 1230 09/01/23 1300 09/01/23 1307 09/01/23 1322  BP: 117/69 119/77  (!) 141/60  Pulse: 91 81  79  Resp: 18 19  18   Temp:   98 F (36.7 C) 98.1 F (36.7 C)  TempSrc:   Oral   SpO2: 94% 100%  96%  Weight:      Height:       Physical Exam HENT:     Head: Normocephalic.     Mouth/Throat:     Pharynx: No oropharyngeal exudate.  Eyes:  General: Lids are normal.     Conjunctiva/sclera: Conjunctivae normal.  Cardiovascular:     Rate and Rhythm: Normal rate and regular rhythm.     Heart sounds: Normal heart sounds, S1 normal and S2 normal.  Pulmonary:     Breath sounds: No decreased breath sounds, wheezing, rhonchi or rales.  Abdominal:     Palpations: Abdomen is soft.     Tenderness: There is abdominal tenderness in the left lower quadrant.  Musculoskeletal:     Right lower leg: No swelling.     Left lower leg: No swelling.  Skin:    General: Skin is warm.      Findings: No rash.  Neurological:     Mental Status: She is alert and oriented to person, place, and time.     Data Reviewed: Previous colonoscopy reviewed and they were unable to place a pediatric scope through the tightening in the colon. Sodium 132, potassium 3.3, hemoglobin 10.8, white blood count 13.1, INR 1.1, a.m. cortisol 31  Family Communication: spoke with grandson  Disposition: Status is: Inpatient Remains inpatient appropriate because: Continue to monitor and treat colitis.  Planned Discharge Destination: Home    Time spent: 28 minutes  Author: Alford Highland, MD 09/01/2023 2:21 PM  For on call review [email or web address].

## 2023-09-01 NOTE — Assessment & Plan Note (Addendum)
 -  Tamiflu

## 2023-09-02 DIAGNOSIS — N1831 Chronic kidney disease, stage 3a: Secondary | ICD-10-CM

## 2023-09-02 DIAGNOSIS — K529 Noninfective gastroenteritis and colitis, unspecified: Secondary | ICD-10-CM | POA: Diagnosis not present

## 2023-09-02 DIAGNOSIS — N390 Urinary tract infection, site not specified: Secondary | ICD-10-CM | POA: Diagnosis not present

## 2023-09-02 DIAGNOSIS — J101 Influenza due to other identified influenza virus with other respiratory manifestations: Secondary | ICD-10-CM | POA: Diagnosis not present

## 2023-09-02 DIAGNOSIS — N179 Acute kidney failure, unspecified: Secondary | ICD-10-CM | POA: Insufficient documentation

## 2023-09-02 DIAGNOSIS — E876 Hypokalemia: Secondary | ICD-10-CM

## 2023-09-02 LAB — CBC
HCT: 33.1 % — ABNORMAL LOW (ref 36.0–46.0)
Hemoglobin: 10.8 g/dL — ABNORMAL LOW (ref 12.0–15.0)
MCH: 30.9 pg (ref 26.0–34.0)
MCHC: 32.6 g/dL (ref 30.0–36.0)
MCV: 94.6 fL (ref 80.0–100.0)
Platelets: 239 10*3/uL (ref 150–400)
RBC: 3.5 MIL/uL — ABNORMAL LOW (ref 3.87–5.11)
RDW: 14.2 % (ref 11.5–15.5)
WBC: 11.4 10*3/uL — ABNORMAL HIGH (ref 4.0–10.5)
nRBC: 0 % (ref 0.0–0.2)

## 2023-09-02 LAB — BASIC METABOLIC PANEL WITH GFR
Anion gap: 12 (ref 5–15)
BUN: 16 mg/dL (ref 8–23)
CO2: 24 mmol/L (ref 22–32)
Calcium: 8.9 mg/dL (ref 8.9–10.3)
Chloride: 100 mmol/L (ref 98–111)
Creatinine, Ser: 1.05 mg/dL — ABNORMAL HIGH (ref 0.44–1.00)
GFR, Estimated: 54 mL/min — ABNORMAL LOW (ref >60)
Glucose, Bld: 104 mg/dL — ABNORMAL HIGH (ref 70–99)
Potassium: 3.9 mmol/L (ref 3.5–5.1)
Sodium: 136 mmol/L (ref 135–145)

## 2023-09-02 LAB — URINE CULTURE
Culture: <10000 — AB
Special Requests: NORMAL

## 2023-09-02 LAB — MAGNESIUM: Magnesium: 2 mg/dL (ref 1.7–2.4)

## 2023-09-02 NOTE — Plan of Care (Signed)
   Problem: Fluid Volume: Goal: Hemodynamic stability will improve Outcome: Progressing   Problem: Clinical Measurements: Goal: Diagnostic test results will improve Outcome: Progressing Goal: Signs and symptoms of infection will decrease Outcome: Progressing   Problem: Respiratory: Goal: Ability to maintain adequate ventilation will improve Outcome: Progressing

## 2023-09-02 NOTE — TOC Initial Note (Signed)
 Transition of Care [facility]) - Initial/Assessment Note    Patient Details  Name: Vanessa Cisneros MRN: [id number] Date of Birth: 13-Aug-1945  Transition of Care [facility]) CM/SW Contact:    Marlowe Sax, RN Phone Number: 09/02/2023, 12:30 PM  Clinical Narrative:                 Transition of Care [facility]) - Inpatient Brief Assessment   Patient Details  Name: Vanessa Cisneros MRN: [id number] Date of Birth: May 30, 1946  Transition of Care [facility]) CM/SW Contact:    Marlowe Sax, RN Phone Number: 09/02/2023, 12:31 PM   Clinical Narrative: Patient from home, no identified toc needs  Transition of Care [facility]) - Inpatient Brief Assessment   Patient Details  Name: Vanessa Cisneros MRN: [id number] Date of Birth: [birth year]  Transition of Care [facility]) CM/SW Contact:    Marlowe Sax, RN Phone Number: 09/02/2023, 12:31 PM   Clinical Narrative:    Transition of Care Asessment: Insurance and Status: Insurance coverage has been reviewed Patient has primary care physician: Yes (Busy - Default status  Danella Penton, MD    Provider Role General - Internal Medicine  Office Phone [phone]) Home environment has been reviewed: yes Prior level of function:: independent Prior/Current Home Services: No current home services Social Drivers of Health Review: SDOH reviewed no interventions necessary Readmission risk has been reviewed: Yes Transition of care needs: no transition of care needs at this time    Transition of Care Asessment: Insurance and Status: Insurance coverage has been reviewed Patient has primary care physician: Yes (Busy - Default status  Danella Penton, MD    Provider Role General - Internal Medicine  Office Phone [phone]) Home environment has been reviewed: yes Prior level of function:: independent Prior/Current Home Services: No current home services Social Drivers of Health Review: SDOH reviewed no interventions necessary Readmission risk has been  reviewed: Yes Transition of care needs: no transition of care needs at this time         Patient Goals and CMS Choice            Expected Discharge Plan and Services                                              Prior Living Arrangements/Services                       Activities of Daily Living   ADL Screening (condition at time of admission) Independently performs ADLs?: Yes (appropriate for developmental age) Is the patient deaf or have difficulty hearing?: No Does the patient have difficulty seeing, even when wearing glasses/contacts?: No Does the patient have difficulty concentrating, remembering, or making decisions?: No  Permission Sought/Granted                  Emotional Assessment              Admission diagnosis:  Generalized abdominal pain [R10.84] Acute colitis [K52.9] Nausea vomiting and diarrhea [R11.2, R19.7] Patient Active Problem List   Diagnosis Date Noted   Acute colitis 09/01/2023   AKI (acute kidney injury) (HCC) 09/01/2023   Acute lower UTI 09/01/2023   Dyslipidemia 09/01/2023   GERD without esophagitis 09/01/2023   Influenza A 09/01/2023   Depression 09/01/2023   Sepsis due to undetermined organism (HCC) 09/01/2023  Hypokalemia 09/01/2023   Nausea vomiting and diarrhea 09/01/2023   PCP:  Danella Penton, MD Pharmacy:   [facility] [phone] - [place], [place] - [address] [place] [address] Phone: [phone] Fax: [phone]     Social Drivers of Health (SDOH) Social History: SDOH Screenings   Food Insecurity: No Food Insecurity (09/01/2023)  Housing: Low Risk  (09/01/2023)  Transportation Needs: No Transportation Needs (09/01/2023)  Utilities: Not At Risk (09/01/2023)  Financial Resource Strain: Low Risk  (08/09/2023)   Received from [facility]  Social Connections: Moderately Isolated (09/01/2023)  Tobacco Use: Unknown (09/01/2023)   SDOH Interventions:      Readmission Risk Interventions     No data to display

## 2023-09-02 NOTE — Assessment & Plan Note (Signed)
 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  Creatinine 1.07 today with a GFR 53.  Hold torsemide while having diarrhea.

## 2023-09-02 NOTE — Progress Notes (Signed)
 Progress Note   Patient: Vanessa Cisneros ZOX:[id number] DOB: 12-05-1945 DOA: 08/31/2023     1 DOS: the patient was seen and examined on 09/02/2023   Brief hospital course: 78 y.o. Caucasian female with medical history significant for CHF, depression, GERD, dyslipidemia and lumbar stenosis, who presented to the emergency room with acute onset of severe abdominal pain with associated nausea, and vomiting.  She has been getting a colonoscopy prep and therefore has been having diarrhea.  She admitted to chills without reported fever.  No cough or wheezing or dyspnea.  No chest pain or palpitations.  No dysuria, oliguria or hematuria or flank pain.  No bleeding diathesis.   ED Course: When she came to the ER, BP was 146/64 with otherwise normal vital signs.  Labs revealed mild hyponatremia and hypochloremia, BUN of 24 and creatinine 1.23 and otherwise unremarkable CMP.  High-sensitivity troponin I was 6 and later 8.  CBC showed leukocytosis of 12.4.  UA showed 6-10 WBCs with no bacteria and small leukocytes.  Influenza A came back positive and respiratory panel was otherwise unremarkable. EKG as reviewed by me : EKG showed normal sinus rhythm with rate of 82 with low voltage QRS. Imaging: Abdominal and pelvic CT scan with contrast revealed the following: Long segment of inflammatory change in the rectosigmoid consistent with focal colitis. The more proximal colon is mildly dilated.   Prominent rugal folds within the stomach which may represent a degree of gastritis. Large hiatal hernia is again noted.   The patient was given morphine sulfate, IV Zofran, 30 mg p.o. Tamiflu, IV Zosyn and 500 mL IV lactated ringer bolus.  She will be admitted to a medical telemetry bed for further evaluation and management.  3/26.  As per grandson she was drinking the prep for the virtual colonoscopy and then started vomiting and did not feel well.  Reviewed colonoscopy from 07/21/2023 and they could not pass a  pediatric scope through.  No biopsies were taken.  3/27.  Patient still with poor appetite.  Not feeling well.  Assessment and Plan: * Acute colitis The patient has subsequent sepsis as manifested by tachypnea and leukocytosis. Could be ischemic versus infectious.  Stool studies negative.  Empiric Zosyn.  Reviewed prior colonoscopy where they could not pass a pediatric scope through the thickening of her colon.  Patient has been having diarrhea since.  Patient will need a virtual colonoscopy as outpatient.    Influenza A Tamiflu  Acute lower UTI Urine culture pending  CKD stage 3a, GFR 45-59 ml/min (HCC) She likely has chronic kidney disease stage IIIa rather than acute kidney injury.  Creatinine 1.05 today with a GFR 54.  Hypokalemia Replaced  Depression Continue Zoloft.  GERD without esophagitis Will continue PPI therapy.  Dyslipidemia On pravastatin        Subjective: Patient still has poor appetite not eating very well.  Still feels uncomfortable in her abdomen.  Admitted with colitis.  Physical Exam: Vitals:   09/01/23 1548 09/01/23 2044 09/02/23 0431 09/02/23 0846  BP: (!) 111/54 (!) 127/52 (!) 136/55 (!) 137/57  Pulse: 81 98 91 92  Resp: 17 17 16 19   Temp: (!) 97.3 F (36.3 C) 98.9 F (37.2 C) 99.2 F (37.3 C) 98.4 F (36.9 C)  TempSrc: Oral     SpO2: 96% 91% 91% 96%  Weight:      Height:       Physical Exam HENT:     Head: Normocephalic.     Mouth/Throat:  Pharynx: No oropharyngeal exudate.  Eyes:     General: Lids are normal.     Conjunctiva/sclera: Conjunctivae normal.  Cardiovascular:     Rate and Rhythm: Normal rate and regular rhythm.     Heart sounds: Normal heart sounds, S1 normal and S2 normal.  Pulmonary:     Breath sounds: No decreased breath sounds, wheezing, rhonchi or rales.  Abdominal:     Palpations: Abdomen is soft.     Tenderness: There is abdominal tenderness in the left lower quadrant.  Musculoskeletal:     Right  lower leg: No swelling.     Left lower leg: No swelling.  Skin:    General: Skin is warm.     Findings: No rash.  Neurological:     Mental Status: She is alert and oriented to person, place, and time.     Data Reviewed: Creatinine 1.05, white blood cell count of 11.4, hemoglobin 10.8, platelet count 239  Family Communication: Family at bedside  Disposition: Status is: Inpatient Remains inpatient appropriate because: Will reevaluate on a day-to-day basis.  See how she does today with her eating.  Planned Discharge Destination: Home    Time spent: 28 minutes  Author: Alford Highland, MD 09/02/2023 1:13 PM  For on call review [email or web address].

## 2023-09-02 NOTE — Plan of Care (Signed)

## 2023-09-03 DIAGNOSIS — K529 Noninfective gastroenteritis and colitis, unspecified: Secondary | ICD-10-CM | POA: Diagnosis not present

## 2023-09-03 DIAGNOSIS — N390 Urinary tract infection, site not specified: Secondary | ICD-10-CM | POA: Diagnosis not present

## 2023-09-03 DIAGNOSIS — J101 Influenza due to other identified influenza virus with other respiratory manifestations: Secondary | ICD-10-CM | POA: Diagnosis not present

## 2023-09-03 DIAGNOSIS — F32A Depression, unspecified: Secondary | ICD-10-CM | POA: Insufficient documentation

## 2023-09-03 DIAGNOSIS — N1831 Chronic kidney disease, stage 3a: Secondary | ICD-10-CM | POA: Diagnosis not present

## 2023-09-03 LAB — CBC
HCT: 30.4 % — ABNORMAL LOW (ref 36.0–46.0)
Hemoglobin: 9.8 g/dL — ABNORMAL LOW (ref 12.0–15.0)
MCH: 30.3 pg (ref 26.0–34.0)
MCHC: 32.2 g/dL (ref 30.0–36.0)
MCV: 94.1 fL (ref 80.0–100.0)
Platelets: 220 10*3/uL (ref 150–400)
RBC: 3.23 MIL/uL — ABNORMAL LOW (ref 3.87–5.11)
RDW: 14 % (ref 11.5–15.5)
WBC: 7.1 10*3/uL (ref 4.0–10.5)
nRBC: 0 % (ref 0.0–0.2)

## 2023-09-03 LAB — BASIC METABOLIC PANEL WITH GFR
Anion gap: 10 (ref 5–15)
BUN: 14 mg/dL (ref 8–23)
CO2: 27 mmol/L (ref 22–32)
Calcium: 9 mg/dL (ref 8.9–10.3)
Chloride: 102 mmol/L (ref 98–111)
Creatinine, Ser: 1.07 mg/dL — ABNORMAL HIGH (ref 0.44–1.00)
GFR, Estimated: 53 mL/min — ABNORMAL LOW (ref >60)
Glucose, Bld: 103 mg/dL — ABNORMAL HIGH (ref 70–99)
Potassium: 3.8 mmol/L (ref 3.5–5.1)
Sodium: 139 mmol/L (ref 135–145)

## 2023-09-03 MED ORDER — AMOXICILLIN-POT CLAVULANATE 500-125 MG PO TABS
1.0000 | ORAL_TABLET | Freq: Two times a day (BID) | ORAL | Status: DC
  Administered 2023-09-03: 1 via ORAL
  Filled 2023-09-03 (×2): qty 1

## 2023-09-03 MED ORDER — AMOXICILLIN-POT CLAVULANATE 500-125 MG PO TABS: 1.0000 | ORAL_TABLET | Freq: Two times a day (BID) | ORAL | 0 refills | Status: AC

## 2023-09-03 MED ORDER — OSELTAMIVIR PHOSPHATE 30 MG PO CAPS: 30.0000 mg | ORAL_CAPSULE | Freq: Every day | ORAL | 0 refills | Status: AC

## 2023-09-03 NOTE — Assessment & Plan Note (Signed)
On Effexor 

## 2023-09-03 NOTE — Plan of Care (Signed)

## 2023-09-03 NOTE — Plan of Care (Signed)
   Problem: Fluid Volume: Goal: Hemodynamic stability will improve Outcome: Progressing   Problem: Clinical Measurements: Goal: Diagnostic test results will improve Outcome: Progressing Goal: Signs and symptoms of infection will decrease Outcome: Progressing   Problem: Respiratory: Goal: Ability to maintain adequate ventilation will improve Outcome: Progressing

## 2023-09-03 NOTE — Discharge Summary (Signed)
 Physician Discharge Summary   Patient: Vanessa Cisneros MRN: [id number] DOB: 1946/02/18  Admit date:     08/31/2023  Discharge date: 09/03/23  Discharge Physician: Alford Highland   PCP: Danella Penton, MD   Recommendations at discharge:   Follow-up Dr. Hyacinth Meeker 5 days Follow-up with Dr. Norma Fredrickson 1 week Will need to reschedule virtual colonoscopy and do the prep over 2 days rather than 1 day.  Discharge Diagnoses: Principal Problem:   Acute colitis Active Problems:   Influenza A   Acute lower UTI   Dyslipidemia   GERD without esophagitis   Hypokalemia   Nausea vomiting and diarrhea   CKD stage 3a, GFR 45-59 ml/min [facility])   Depression    Hospital Course: 78 y.o. Caucasian female with medical history significant for CHF, depression, GERD, dyslipidemia and lumbar stenosis, who presented to the emergency room with acute onset of severe abdominal pain with associated nausea, and vomiting.  She has been getting a colonoscopy prep and therefore has been having diarrhea.  She admitted to chills without reported fever.  No cough or wheezing or dyspnea.  No chest pain or palpitations.  No dysuria, oliguria or hematuria or flank pain.  No bleeding diathesis.   ED Course: When she came to the ER, BP was 146/64 with otherwise normal vital signs.  Labs revealed mild hyponatremia and hypochloremia, BUN of 24 and creatinine 1.23 and otherwise unremarkable CMP.  High-sensitivity troponin I was 6 and later 8.  CBC showed leukocytosis of 12.4.  UA showed 6-10 WBCs with no bacteria and small leukocytes.  Influenza A came back positive and respiratory panel was otherwise unremarkable. EKG as reviewed by me : EKG showed normal sinus rhythm with rate of 82 with low voltage QRS. Imaging: Abdominal and pelvic CT scan with contrast revealed the following: Long segment of inflammatory change in the rectosigmoid consistent with focal colitis. The more proximal colon is mildly dilated.   Prominent rugal folds  within the stomach which may represent a degree of gastritis. Large hiatal hernia is again noted.   The patient was given morphine sulfate, IV Zofran, 30 mg p.o. Tamiflu, IV Zosyn and 500 mL IV lactated ringer bolus.  She will be admitted to a medical telemetry bed for further evaluation and management.  3/26.  As per grandson she was drinking the prep for the virtual colonoscopy and then started vomiting and did not feel well.  Reviewed colonoscopy from 07/21/2023 and they could not pass a pediatric scope through.  No biopsies were taken.  3/27.  Patient still with poor appetite.  Not feeling well. 3/28.  Patient tolerating diet.  Still having some diarrhea but not a lot in quantity.  Patient wants to go home and follow-up as outpatient.  Will prescribe Augmentin for to complete course.  Assessment and Plan: * Acute colitis Sepsis ruled out. Could be ischemic versus infectious.  Stool studies negative.  Empiric Zosyn switched over to Augmentin to complete course.  Reviewed prior colonoscopy where they could not pass a pediatric scope through the stricture of her colon.  Patient has been having diarrhea since.  Patient will need a virtual colonoscopy as outpatient and likely will have to be prepped over 2 days rather than 1 day.  Patient tolerating diet.    Influenza A Tamiflu  Acute lower UTI Ruled out.  Culture growing insignificant growth.  Depression On Effexor  CKD stage 3a, GFR 45-59 ml/min (HCC) She likely has chronic kidney disease stage IIIa rather than acute  kidney injury.  Creatinine 1.07 today with a GFR 53.  Hold torsemide while having diarrhea.  Hypokalemia Replaced  GERD without esophagitis Will continue PPI therapy.  Dyslipidemia On pravastatin         Consultants: Gastroenterology Procedures performed: None Disposition: Home Diet recommendation:  Cardiac diet DISCHARGE MEDICATION: Allergies as of 09/03/2023       Reactions   Levofloxacin    Patient  stated that it makes her extremely sore, and she can hardly move.    Tramadol         Medication List     STOP taking these medications    cyclobenzaprine 5 MG tablet Commonly known as: FLEXERIL   meloxicam 15 MG tablet Commonly known as: MOBIC   OVER THE COUNTER MEDICATION   sertraline 100 MG tablet Commonly known as: ZOLOFT   torsemide 20 MG tablet Commonly known as: DEMADEX   VITAMIN B-12 PO       TAKE these medications    amoxicillin-clavulanate 500-125 MG tablet Commonly known as: AUGMENTIN Take 1 tablet by mouth 2 (two) times daily for 5 days.   CALCIUM 600 + D PO Take 1 tablet by mouth 2 (two) times daily.   donepezil 5 MG tablet Commonly known as: ARICEPT Take 1 tablet by mouth at bedtime.   fish oil-omega-3 fatty acids 1000 MG capsule Take 2 g by mouth 2 (two) times daily.   fluticasone 50 MCG/ACT nasal spray Commonly known as: FLONASE Place 2 sprays into both nostrils daily.   hydroxychloroquine 200 MG tablet Commonly known as: PLAQUENIL Take 200 mg by mouth daily.   lovastatin 20 MG tablet Commonly known as: MEVACOR Take 20 mg by mouth at bedtime.   omeprazole 20 MG capsule Commonly known as: PRILOSEC Take 20 mg by mouth daily.   oseltamivir 30 MG capsule Commonly known as: TAMIFLU Take 1 capsule (30 mg total) by mouth daily for 2 days. Start taking on: September 04, 2023   sucralfate 1 g tablet Commonly known as: CARAFATE Take 1 g by mouth 2 (two) times daily.   venlafaxine XR 75 MG 24 hr capsule Commonly known as: EFFEXOR-XR Take 75 mg by mouth daily.        Follow-up Information     Danella Penton, MD. Call in 5 day(s).   Specialty: Internal Medicine Contact information: [phone] [facility] [address] [facility] [place] [address] [phone]         Stanton Kidney, MD Follow up in 1 week(s).   Specialty: Gastroenterology Contact information: [address] [place]  [address] [phone]                Discharge Exam: Ceasar Mons Weights   08/31/23 2141  Weight: 38.6 kg   Physical Exam HENT:     Head: Normocephalic.     Mouth/Throat:     Pharynx: No oropharyngeal exudate.  Eyes:     General: Lids are normal.     Conjunctiva/sclera: Conjunctivae normal.  Cardiovascular:     Rate and Rhythm: Normal rate and regular rhythm.     Heart sounds: Normal heart sounds, S1 normal and S2 normal.  Pulmonary:     Breath sounds: No decreased breath sounds, wheezing, rhonchi or rales.  Abdominal:     Palpations: Abdomen is soft.     Tenderness: There is abdominal tenderness in the left lower quadrant.  Musculoskeletal:     Right lower leg: No swelling.     Left lower leg:  No swelling.  Skin:    General: Skin is warm.     Findings: No rash.  Neurological:     Mental Status: She is alert and oriented to person, place, and time.      Condition at discharge: stable  The results of significant diagnostics from this hospitalization (including imaging, microbiology, ancillary and laboratory) are listed below for reference.   Imaging Studies: CT ABDOMEN PELVIS W CONTRAST Result Date: 08/31/2023 CLINICAL DATA:  Left lower quadrant pain EXAM: CT ABDOMEN AND PELVIS WITH CONTRAST TECHNIQUE: Multidetector CT imaging of the abdomen and pelvis was performed using the standard protocol following bolus administration of intravenous contrast. RADIATION DOSE REDUCTION: This exam was performed according to the departmental dose-optimization program which includes automated exposure control, adjustment of the mA and/or kV according to patient size and/or use of iterative reconstruction technique. CONTRAST:  75mL OMNIPAQUE IOHEXOL 300 MG/ML  SOLN COMPARISON:  None Available. FINDINGS: Lower chest: Lung bases are free of acute infiltrate or sizable effusion. Large hiatal hernia is noted with the majority of the stomach within the chest cavity. Hepatobiliary: No focal liver  abnormality is seen. Status post cholecystectomy. No biliary dilatation. Pancreas: Unremarkable. No pancreatic ductal dilatation or surrounding inflammatory changes. Spleen: Normal in size without focal abnormality. Adrenals/Urinary Tract: Adrenal glands are within normal limits. Kidneys demonstrate a normal enhancement pattern bilaterally. Bladder is well distended. Stomach/Bowel: Considerable wall thickening is noted within the rectosigmoid region consistent with focal colitis. No discrete mass is seen. The more proximal colon is distended with air and contrast material likely from recent fluoroscopic study. Large hiatal hernia is again identified. The rugal folds are prominent likely representing a degree of gastritis. The small bowel shows no obstructive change. Vascular/Lymphatic: Aortic atherosclerosis. No enlarged abdominal or pelvic lymph nodes. Reproductive: Status post hysterectomy. No adnexal masses. Other: No abdominal wall hernia or abnormality. No abdominopelvic ascites. Musculoskeletal: Degenerative changes of lumbar spine are noted. IMPRESSION: Long segment of inflammatory change in the rectosigmoid consistent with focal colitis. The more proximal colon is mildly dilated. Prominent rugal folds within the stomach which may represent a degree of gastritis. Large hiatal hernia is again noted. Electronically Signed   By: Alcide Clever M.D.   On: 08/31/2023 23:21   DG UGI W DOUBLE CM (HD BA) Result Date: 08/10/2023 CLINICAL DATA:  Intermittent dysphagia. Known history of large hiatal hernia dating back to at least 2002 EXAM: UPPER GI SERIES WITH HIGH DENSITY WITHOUT KUB TECHNIQUE: Combined double and single contrast examination was performed using high-density barium and thin liquid barium. This exam was performed by Brayton El PA-C , and was supervised and interpreted by Dr. Paulina Fusi. FLUOROSCOPY: Radiation Exposure Index (as provided by the fluoroscopic device): 28.00 mGy Kerma COMPARISON:  None  Available. FINDINGS: Pharynx/swallowing: There is flash penetration but no aspiration demonstrated. Esophagus: No mass lesion or fixed stricture. There is a moderate size hiatal hernia. There is paraesophageal component including a portion of the antrum. There appears to be relative elevation of the left hemidiaphragm which allows elevation of the gastric fundus. Esophageal motility: Ordinary age related tertiary contractions suggest at least mild degree of dysmotility. Stomach: Chronic hiatal hernia including the gastroesophageal junction and proximal stomach with a paraesophageal component including the antrum. Elevation of left hemidiaphragm which allows elevation of the fundus. No evidence of malrotation or obstruction. No definitive mucosal lesions or filling defects. Gastroesophageal reflux: None visualized. Ingested 13 mm barium tablet: Passed normally Gastric emptying: Normal. Duodenum: Normal appearance with active peristalsis.  Other:  None. IMPRESSION: Esophagus without inflammatory change or fixed strictures. Ordinary age related tertiary contractions. Large mixed type hiatal hernia. Antrum shows paraesophageal herniation. Somewhat complicating the picture, the left hemidiaphragm appears to be elevated allowing elevation of the fundus of the stomach. No evidence of malrotation or obstruction. Otherwise no mucosal lesions or filling defects. CT scan of the lower chest and upper abdomen could be useful to help [place] understand the actual size of the diaphragmatic defect versus contribution of the elevated left hemidiaphragm. No evidence of gastroesophageal reflux disease. Electronically Signed   By: Paulina Fusi M.D.   On: 08/10/2023 10:43    Microbiology: Results for orders placed or performed during the hospital encounter of 08/31/23  Resp panel by RT-PCR (RSV, Flu A&B, Covid) Anterior Nasal Swab     Status: Abnormal   Collection Time: 08/31/23  9:44 PM   Specimen: Anterior Nasal Swab  Result Value  Ref Range Status   SARS Coronavirus 2 by RT PCR NEGATIVE NEGATIVE Final    Comment: (NOTE) SARS-CoV-2 target nucleic acids are NOT DETECTED.  The SARS-CoV-2 RNA is generally detectable in upper respiratory specimens during the acute phase of infection. The lowest concentration of SARS-CoV-2 viral copies this assay can detect is 138 copies/mL. A negative result does not preclude SARS-Cov-2 infection and should not be used as the sole basis for treatment or other patient management decisions. A negative result may occur with  improper specimen collection/handling, submission of specimen other than nasopharyngeal swab, presence of viral mutation(s) within the areas targeted by this assay, and inadequate number of viral copies(<138 copies/mL). A negative result must be combined with clinical observations, patient history, and epidemiological information. The expected result is Negative.  Fact Sheet for Patients:  BloggerCourse.com  Fact Sheet for Healthcare Providers:  SeriousBroker.it  This test is no t yet approved or cleared by the [place] FDA and  has been authorized for detection and/or diagnosis of SARS-CoV-2 by FDA under an Emergency Use Authorization (EUA). This EUA will remain  in effect (meaning this test can be used) for the duration of the COVID-19 declaration under Section 564(b)(1) of the Act, 21 U.S.C.section 360bbb-3(b)(1), unless the authorization is terminated  or revoked sooner.       Influenza A by PCR POSITIVE (A) NEGATIVE Final   Influenza B by PCR NEGATIVE NEGATIVE Final    Comment: (NOTE) The Xpert Xpress SARS-CoV-2/FLU/RSV plus assay is intended as an aid in the diagnosis of influenza from Nasopharyngeal swab specimens and should not be used as a sole basis for treatment. Nasal washings and aspirates are unacceptable for Xpert Xpress SARS-CoV-2/FLU/RSV testing.  Fact Sheet for  Patients: BloggerCourse.com  Fact Sheet for Healthcare Providers: SeriousBroker.it  This test is not yet approved or cleared by the [place] FDA and has been authorized for detection and/or diagnosis of SARS-CoV-2 by FDA under an Emergency Use Authorization (EUA). This EUA will remain in effect (meaning this test can be used) for the duration of the COVID-19 declaration under Section 564(b)(1) of the Act, 21 U.S.C. section 360bbb-3(b)(1), unless the authorization is terminated or revoked.     Resp Syncytial Virus by PCR NEGATIVE NEGATIVE Final    Comment: (NOTE) Fact Sheet for Patients: BloggerCourse.com  Fact Sheet for Healthcare Providers: SeriousBroker.it  This test is not yet approved or cleared by the [place] FDA and has been authorized for detection and/or diagnosis of SARS-CoV-2 by FDA under an Emergency Use Authorization (EUA). This EUA will remain in effect (  meaning this test can be used) for the duration of the COVID-19 declaration under Section 564(b)(1) of the Act, 21 U.S.C. section 360bbb-3(b)(1), unless the authorization is terminated or revoked.  Performed at [facility], [address], [place], [place] [address]   Urine Culture (for pregnant, neutropenic or urologic patients or patients with an indwelling urinary catheter)     Status: Abnormal   Collection Time: 08/31/23  9:44 PM   Specimen: Urine, Clean Catch  Result Value Ref Range Status   Specimen Description   Final    URINE, CLEAN CATCH Performed at [facility], [address] [place], [place] [address]    Special Requests   Final    Normal Performed at [facility], [address] [place], [place] [address]    Culture (A)  Final    <10,000 COLONIES/mL INSIGNIFICANT GROWTH Performed at [facility], [address], [place], [place] [address]     Report Status 09/02/2023 FINAL  Final  Gastrointestinal Panel by PCR , Stool     Status: None   Collection Time: 09/01/23  2:00 PM   Specimen: Stool  Result Value Ref Range Status   Campylobacter species NOT DETECTED NOT DETECTED Final   Plesimonas shigelloides NOT DETECTED NOT DETECTED Final   Salmonella species NOT DETECTED NOT DETECTED Final   Yersinia enterocolitica NOT DETECTED NOT DETECTED Final   Vibrio species NOT DETECTED NOT DETECTED Final   Vibrio cholerae NOT DETECTED NOT DETECTED Final   Enteroaggregative E coli (EAEC) NOT DETECTED NOT DETECTED Final   Enteropathogenic E coli (EPEC) NOT DETECTED NOT DETECTED Final   Enterotoxigenic E coli (ETEC) NOT DETECTED NOT DETECTED Final   Shiga like toxin producing E coli (STEC) NOT DETECTED NOT DETECTED Final   Shigella/Enteroinvasive E coli (EIEC) NOT DETECTED NOT DETECTED Final   Cryptosporidium NOT DETECTED NOT DETECTED Final   Cyclospora cayetanensis NOT DETECTED NOT DETECTED Final   Entamoeba histolytica NOT DETECTED NOT DETECTED Final   Giardia lamblia NOT DETECTED NOT DETECTED Final   Adenovirus F40/41 NOT DETECTED NOT DETECTED Final   Astrovirus NOT DETECTED NOT DETECTED Final   Norovirus GI/GII NOT DETECTED NOT DETECTED Final   Rotavirus A NOT DETECTED NOT DETECTED Final   Sapovirus (I, II, IV, and V) NOT DETECTED NOT DETECTED Final    Comment: Performed at [facility], [address] [place], [place] [address]  C Difficile Quick Screen w PCR reflex     Status: None   Collection Time: 09/01/23  2:00 PM   Specimen: STOOL  Result Value Ref Range Status   C Diff antigen NEGATIVE NEGATIVE Final   C Diff toxin NEGATIVE NEGATIVE Final   C Diff interpretation No C. difficile detected.  Final    Comment: Performed at [facility], [address] [place], [place] [address]    Labs: CBC: Recent Labs  Lab 08/31/23 2144 09/01/23 0550 09/02/23 0419 09/03/23 0500  WBC 12.4* 13.1* 11.4* 7.1  NEUTROABS  10.5*  --   --   --   HGB 12.2 10.8* 10.8* 9.8*  HCT 38.0 34.3* 33.1* 30.4*  MCV 96.0 96.9 94.6 94.1  PLT 242 206 239 220   Basic Metabolic Panel: Recent Labs  Lab 08/31/23 2144 09/01/23 0550 09/02/23 0419 09/03/23 0500  NA 132* 132* 136 139  K 3.7 3.3* 3.9 3.8  CL 94* 98 100 102  CO2 26 25 24 27   GLUCOSE 100* 102* 104* 103*  BUN 24* 21  16 14  CREATININE 1.23* 0.99 1.05* 1.07*  CALCIUM 9.7 8.7* 8.9 9.0  MG  --   --  2.0  --    Liver Function Tests: Recent Labs  Lab 08/31/23 2144  AST 24  ALT 17  ALKPHOS 43  BILITOT 0.5  PROT 7.2  ALBUMIN 3.9   CBG: No results for input(s): "GLUCAP" in the last 168 hours.  Discharge time spent: greater than 30 minutes.  Signed: Alford Highland, MD [facility] 09/03/2023

## 2023-10-06 ENCOUNTER — Other Ambulatory Visit: Payer: Self-pay

## 2023-10-06 ENCOUNTER — Encounter (HOSPITAL_COMMUNITY): Payer: Self-pay

## 2023-10-06 ENCOUNTER — Inpatient Hospital Stay (HOSPITAL_COMMUNITY)
Admission: EM | Admit: 2023-10-06 | Discharge: 2023-10-11 | DRG: 329 | Disposition: A | Discharge status: Home Health | Attending: Student | Admitting: Student

## 2023-10-06 ENCOUNTER — Other Ambulatory Visit: Payer: Self-pay | Admitting: Internal Medicine

## 2023-10-06 ENCOUNTER — Ambulatory Visit
Admission: RE | Admit: 2023-10-06 | Discharge: 2023-10-06 | Disposition: A | Discharge status: Home / Self Care | Source: Ambulatory Visit | Attending: Internal Medicine | Admitting: Internal Medicine

## 2023-10-06 DIAGNOSIS — K56699 Other intestinal obstruction unspecified as to partial versus complete obstruction: Secondary | ICD-10-CM | POA: Insufficient documentation

## 2023-10-06 DIAGNOSIS — K651 Peritoneal abscess: Secondary | ICD-10-CM | POA: Diagnosis present

## 2023-10-06 DIAGNOSIS — R1084 Generalized abdominal pain: Secondary | ICD-10-CM

## 2023-10-06 DIAGNOSIS — E782 Mixed hyperlipidemia: Secondary | ICD-10-CM | POA: Diagnosis present

## 2023-10-06 DIAGNOSIS — Z79899 Other long term (current) drug therapy: Secondary | ICD-10-CM | POA: Diagnosis not present

## 2023-10-06 DIAGNOSIS — D500 Iron deficiency anemia secondary to blood loss (chronic): Secondary | ICD-10-CM | POA: Diagnosis present

## 2023-10-06 DIAGNOSIS — N1831 Chronic kidney disease, stage 3a: Secondary | ICD-10-CM | POA: Diagnosis present

## 2023-10-06 DIAGNOSIS — D62 Acute posthemorrhagic anemia: Secondary | ICD-10-CM | POA: Diagnosis not present

## 2023-10-06 DIAGNOSIS — Z681 Body mass index (BMI) 19 or less, adult: Secondary | ICD-10-CM

## 2023-10-06 DIAGNOSIS — Z66 Do not resuscitate: Secondary | ICD-10-CM | POA: Diagnosis present

## 2023-10-06 DIAGNOSIS — K449 Diaphragmatic hernia without obstruction or gangrene: Secondary | ICD-10-CM | POA: Diagnosis present

## 2023-10-06 DIAGNOSIS — N179 Acute kidney failure, unspecified: Secondary | ICD-10-CM | POA: Diagnosis present

## 2023-10-06 DIAGNOSIS — Z90710 Acquired absence of both cervix and uterus: Secondary | ICD-10-CM

## 2023-10-06 DIAGNOSIS — E785 Hyperlipidemia, unspecified: Secondary | ICD-10-CM | POA: Diagnosis not present

## 2023-10-06 DIAGNOSIS — K56609 Unspecified intestinal obstruction, unspecified as to partial versus complete obstruction: Secondary | ICD-10-CM | POA: Diagnosis not present

## 2023-10-06 DIAGNOSIS — K6289 Other specified diseases of anus and rectum: Secondary | ICD-10-CM | POA: Diagnosis present

## 2023-10-06 DIAGNOSIS — K5720 Diverticulitis of large intestine with perforation and abscess without bleeding: Secondary | ICD-10-CM | POA: Diagnosis present

## 2023-10-06 DIAGNOSIS — Z933 Colostomy status: Secondary | ICD-10-CM

## 2023-10-06 DIAGNOSIS — Z881 Allergy status to other antibiotic agents status: Secondary | ICD-10-CM | POA: Diagnosis not present

## 2023-10-06 DIAGNOSIS — M064 Inflammatory polyarthropathy: Secondary | ICD-10-CM | POA: Diagnosis present

## 2023-10-06 DIAGNOSIS — E876 Hypokalemia: Secondary | ICD-10-CM | POA: Diagnosis present

## 2023-10-06 DIAGNOSIS — I509 Heart failure, unspecified: Secondary | ICD-10-CM | POA: Diagnosis present

## 2023-10-06 DIAGNOSIS — K219 Gastro-esophageal reflux disease without esophagitis: Secondary | ICD-10-CM | POA: Diagnosis present

## 2023-10-06 DIAGNOSIS — M1990 Unspecified osteoarthritis, unspecified site: Secondary | ICD-10-CM | POA: Diagnosis present

## 2023-10-06 DIAGNOSIS — K529 Noninfective gastroenteritis and colitis, unspecified: Secondary | ICD-10-CM | POA: Diagnosis present

## 2023-10-06 DIAGNOSIS — R1310 Dysphagia, unspecified: Secondary | ICD-10-CM | POA: Diagnosis present

## 2023-10-06 DIAGNOSIS — F32A Depression, unspecified: Secondary | ICD-10-CM | POA: Diagnosis present

## 2023-10-06 DIAGNOSIS — E441 Mild protein-calorie malnutrition: Secondary | ICD-10-CM | POA: Diagnosis present

## 2023-10-06 DIAGNOSIS — K630 Abscess of intestine: Secondary | ICD-10-CM | POA: Diagnosis not present

## 2023-10-06 DIAGNOSIS — Z885 Allergy status to narcotic agent status: Secondary | ICD-10-CM | POA: Diagnosis not present

## 2023-10-06 DIAGNOSIS — Z9049 Acquired absence of other specified parts of digestive tract: Secondary | ICD-10-CM | POA: Diagnosis not present

## 2023-10-06 LAB — CBC WITH DIFFERENTIAL/PLATELET
Abs Immature Granulocytes: 0.02 10*3/uL (ref 0.00–0.07)
Basophils Absolute: 0.1 10*3/uL (ref 0.0–0.1)
Basophils Relative: 1 %
Eosinophils Absolute: 0 10*3/uL (ref 0.0–0.5)
Eosinophils Relative: 0 %
HCT: 32 % — ABNORMAL LOW (ref 36.0–46.0)
Hemoglobin: 9.7 g/dL — ABNORMAL LOW (ref 12.0–15.0)
Immature Granulocytes: 0 %
Lymphocytes Relative: 7 %
Lymphs Abs: 0.7 10*3/uL (ref 0.7–4.0)
MCH: 28.4 pg (ref 26.0–34.0)
MCHC: 30.3 g/dL (ref 30.0–36.0)
MCV: 93.8 fL (ref 80.0–100.0)
Monocytes Absolute: 0.7 10*3/uL (ref 0.1–1.0)
Monocytes Relative: 8 %
Neutro Abs: 8.1 10*3/uL — ABNORMAL HIGH (ref 1.7–7.7)
Neutrophils Relative %: 84 %
Platelets: 411 10*3/uL — ABNORMAL HIGH (ref 150–400)
RBC: 3.41 MIL/uL — ABNORMAL LOW (ref 3.87–5.11)
RDW: 16.1 % — ABNORMAL HIGH (ref 11.5–15.5)
WBC: 9.6 10*3/uL (ref 4.0–10.5)
nRBC: 0 % (ref 0.0–0.2)

## 2023-10-06 LAB — URINALYSIS, ROUTINE W REFLEX MICROSCOPIC
Bacteria, UA: NONE SEEN
Bilirubin Urine: NEGATIVE
Glucose, UA: NEGATIVE mg/dL
Ketones, ur: 5 mg/dL — AB
Leukocytes,Ua: NEGATIVE
Nitrite: NEGATIVE
Protein, ur: 30 mg/dL — AB
Specific Gravity, Urine: >1.046 — ABNORMAL HIGH (ref 1.005–1.030)
pH: 6 (ref 5.0–8.0)

## 2023-10-06 LAB — COMPREHENSIVE METABOLIC PANEL WITH GFR
ALT: 13 U/L (ref 0–44)
AST: 20 U/L (ref 15–41)
Albumin: 2.9 g/dL — ABNORMAL LOW (ref 3.5–5.0)
Alkaline Phosphatase: 49 U/L (ref 38–126)
Anion gap: 19 — ABNORMAL HIGH (ref 5–15)
BUN: 25 mg/dL — ABNORMAL HIGH (ref 8–23)
CO2: 25 mmol/L (ref 22–32)
Calcium: 9.6 mg/dL (ref 8.9–10.3)
Chloride: 93 mmol/L — ABNORMAL LOW (ref 98–111)
Creatinine, Ser: 1.47 mg/dL — ABNORMAL HIGH (ref 0.44–1.00)
GFR, Estimated: 36 mL/min — ABNORMAL LOW (ref >60)
Glucose, Bld: 80 mg/dL (ref 70–99)
Potassium: 3.6 mmol/L (ref 3.5–5.1)
Sodium: 137 mmol/L (ref 135–145)
Total Bilirubin: 0.5 mg/dL (ref 0.0–1.2)
Total Protein: 6.4 g/dL — ABNORMAL LOW (ref 6.5–8.1)

## 2023-10-06 LAB — I-STAT CG4 LACTIC ACID, ED: Lactic Acid, Venous: 1.1 mmol/L (ref 0.5–1.9)

## 2023-10-06 MED ORDER — ONDANSETRON HCL 4 MG/2ML IJ SOLN
4.0000 mg | Freq: Once | INTRAMUSCULAR | Status: DC
  Filled 2023-10-06: qty 2

## 2023-10-06 MED ORDER — SODIUM CHLORIDE 0.9 % IV SOLN
2.0000 g | Freq: Once | INTRAVENOUS | Status: AC
  Administered 2023-10-06: 2 g via INTRAVENOUS
  Filled 2023-10-06: qty 20

## 2023-10-06 MED ORDER — ACETAMINOPHEN 325 MG PO TABS: 650.0000 mg | ORAL_TABLET | Freq: Four times a day (QID) | ORAL | Status: DC | PRN

## 2023-10-06 MED ORDER — FENTANYL CITRATE PF 50 MCG/ML IJ SOSY
12.5000 ug | PREFILLED_SYRINGE | INTRAMUSCULAR | Status: DC | PRN
  Administered 2023-10-06 – 2023-10-09 (×6): 50 ug via INTRAVENOUS
  Administered 2023-10-09 (×2): 12.5 ug via INTRAVENOUS
  Filled 2023-10-06 (×8): qty 1

## 2023-10-06 MED ORDER — METRONIDAZOLE 500 MG/100ML IV SOLN
500.0000 mg | Freq: Two times a day (BID) | INTRAVENOUS | Status: DC
  Administered 2023-10-07 – 2023-10-10 (×8): 500 mg via INTRAVENOUS
  Filled 2023-10-06 (×9): qty 100

## 2023-10-06 MED ORDER — IOHEXOL 300 MG/ML  SOLN
75.0000 mL | Freq: Once | INTRAMUSCULAR | Status: AC | PRN
  Administered 2023-10-06: 75 mL via INTRAVENOUS

## 2023-10-06 MED ORDER — PROCHLORPERAZINE EDISYLATE 10 MG/2ML IJ SOLN
5.0000 mg | Freq: Four times a day (QID) | INTRAMUSCULAR | Status: DC | PRN
Start: 2023-10-06 — End: 2023-10-07
  Administered 2023-10-06 – 2023-10-07 (×2): 5 mg via INTRAVENOUS
  Filled 2023-10-06 (×3): qty 2

## 2023-10-06 MED ORDER — PANTOPRAZOLE SODIUM 40 MG PO TBEC
40.0000 mg | DELAYED_RELEASE_TABLET | Freq: Every day | ORAL | Status: DC
  Administered 2023-10-07 – 2023-10-11 (×5): 40 mg via ORAL
  Filled 2023-10-06 (×5): qty 1

## 2023-10-06 MED ORDER — ACETAMINOPHEN 650 MG RE SUPP: 650.0000 mg | Freq: Four times a day (QID) | RECTAL | Status: DC | PRN

## 2023-10-06 MED ORDER — LACTATED RINGERS IV BOLUS
250.0000 mL | Freq: Once | INTRAVENOUS | Status: AC
  Administered 2023-10-06: 250 mL via INTRAVENOUS

## 2023-10-06 MED ORDER — SODIUM CHLORIDE 0.9 % IV SOLN
2.0000 g | INTRAVENOUS | Status: DC
  Administered 2023-10-07 – 2023-10-10 (×4): 2 g via INTRAVENOUS
  Filled 2023-10-06 (×4): qty 20

## 2023-10-06 MED ORDER — METRONIDAZOLE 500 MG/100ML IV SOLN
500.0000 mg | Freq: Once | INTRAVENOUS | Status: AC
  Administered 2023-10-06: 500 mg via INTRAVENOUS
  Filled 2023-10-06: qty 100

## 2023-10-06 MED ORDER — SODIUM CHLORIDE 0.9% FLUSH
3.0000 mL | Freq: Two times a day (BID) | INTRAVENOUS | Status: DC
  Administered 2023-10-06 – 2023-10-11 (×7): 3 mL via INTRAVENOUS

## 2023-10-06 MED ORDER — ONDANSETRON HCL 4 MG/2ML IJ SOLN
4.0000 mg | Freq: Once | INTRAMUSCULAR | Status: AC
  Administered 2023-10-06: 4 mg via INTRAVENOUS
  Filled 2023-10-06: qty 2

## 2023-10-06 MED ORDER — LACTATED RINGERS IV SOLN: INTRAVENOUS | Status: AC

## 2023-10-06 NOTE — ED Triage Notes (Addendum)
 Patient c/o abdominal pain and hard abdomen, 10lbs weight loss in a month, abnormal BM, fatigue, and decreased appetite x 1 month. PCP did CT scan and results were concerning for cancer and sent her here for eval.

## 2023-10-06 NOTE — ED Provider Triage Note (Signed)
 Emergency Medicine Provider Triage Evaluation Note  Vanessa Cisneros , a 78 y.o. female  was evaluated in triage.  Pt complains of abdominal pain, constipation.  Patient was being worked up by PCP.  Patient had a CT scan performed which shows mass versus abscess in the vicinity of the rectosigmoid colon.  Patient was sent to the emergency department for further evaluation of this.  Review of Systems  Positive: Constipation, abdominal pain Negative: Fever  Physical Exam  BP (!) 126/42 (BP Location: Left Arm)   Pulse 93   Temp 98.1 F (36.7 C)   Resp 16   Ht 4\' 11"  (1.499 m)   Wt 38.6 kg   SpO2 92%   BMI 17.17 kg/m  Gen:   Awake, no distress   Resp:  Normal effort  MSK:   Moves extremities without difficulty  Other:  Generalized abdominal tenderness to palpation  Medical Decision Making  Medically screening exam initiated at 6:10 PM.  Appropriate orders placed.  Vanessa Cisneros was informed that the remainder of the evaluation will be completed by another provider, this initial triage assessment does not replace that evaluation, and the importance of remaining in the ED until their evaluation is complete.     [name], PA-C 10/06/23 7829

## 2023-10-06 NOTE — ED Provider Notes (Signed)
 [place] EMERGENCY DEPARTMENT AT [facility] Provider Note   CSN: [id number] Arrival date & time: 10/06/23  1711     History CHF, Depression, GERD Chief Complaint  Patient presents with   Abdominal Pain    Vanessa Cisneros is a 78 y.o. female.  78 y.o female with a PMH of CHF, Depression, GERD presents to the ED with a chief complaint of nausea, abdominal pain for the past month.  Previously evaluated at our GI, had a colonoscopy, has been having ongoing pain along the upper part of her abdomen, later she was scheduled for a virtual colonoscopy, reports she performed all the prep and did not actually have the exam.  She has had ongoing pain to her abdomen for the past several months.  She is also feeling nauseated, and also having some diarrhea. Pain exacerbated with breathing. She did go get a CT today at [facility] which showed a rim-enhancing lesion concern for a abscess versus malignancy.  Patient has been taking BC powder to help with her stomach pain. Began vomiting upon arrival to the ED. No fever, no chest pain, no prior hx of CA.   The history is provided by the patient.  Abdominal Pain Associated symptoms: diarrhea, nausea and vomiting   Associated symptoms: no chest pain, no chills, no constipation, no fever, no shortness of breath and no sore throat        Home Medications Prior to Admission medications   Medication Sig Start Date End Date Taking? Authorizing Provider  Calcium  Carbonate-Vitamin D (CALCIUM  600 + D PO) Take 1 tablet by mouth 2 (two) times daily.      [provider]  donepezil (ARICEPT) 5 MG tablet Take 1 tablet by mouth at bedtime. 07/19/23 07/18/24  [provider]  fish oil-omega-3 fatty acids 1000 MG capsule Take 2 g by mouth 2 (two) times daily.      [provider]  fluticasone (FLONASE) 50 MCG/ACT nasal spray Place 2 sprays into both nostrils daily. 07/07/23   [provider]  hydroxychloroquine  (PLAQUENIL) 200 MG tablet Take 200 mg by mouth daily.    [provider]  lovastatin (MEVACOR) 20 MG tablet Take 20 mg by mouth at bedtime.      [provider]  omeprazole (PRILOSEC) 20 MG capsule Take 20 mg by mouth daily.      [provider]  sucralfate (CARAFATE) 1 g tablet Take 1 g by mouth 2 (two) times daily.    [provider]  venlafaxine XR (EFFEXOR-XR) 75 MG 24 hr capsule Take 75 mg by mouth daily.    [provider]      Allergies    Levofloxacin and Tramadol    Review of Systems   Review of Systems  Constitutional:  Negative for chills and fever.  HENT:  Negative for sore throat.   Respiratory:  Negative for shortness of breath.   Cardiovascular:  Negative for chest pain.  Gastrointestinal:  Positive for abdominal pain, diarrhea, nausea and vomiting. Negative for blood in stool and constipation.  Genitourinary:  Negative for difficulty urinating.  Musculoskeletal:  Negative for back pain.  All other systems reviewed and are negative.   Physical Exam Updated Vital Signs BP (!) 155/68   Pulse 87   Temp 98.1 F (36.7 C)   Resp 18   Ht 4\' 11"  (1.499 m)   Wt 38.6 kg   SpO2 97%   BMI 17.17 kg/m  Physical Exam Vitals and  nursing note reviewed.  Constitutional:      Appearance: She is well-developed.  HENT:     Head: Normocephalic and atraumatic.  Cardiovascular:     Rate and Rhythm: Normal rate.  Pulmonary:     Effort: Pulmonary effort is normal.     Breath sounds: No wheezing or rales.  Abdominal:     General: Abdomen is flat. Bowel sounds are decreased.     Palpations: Abdomen is soft.     Tenderness: There is abdominal tenderness in the epigastric area. There is guarding. There is no right CVA tenderness or left CVA tenderness.     Hernia: No hernia is present.  Skin:    General: Skin is warm and dry.  Neurological:     Mental Status: She is alert and oriented to person, place, and time.     ED Results /  Procedures / Treatments   Labs (all labs ordered are listed, but only abnormal results are displayed) Labs Reviewed  CBC WITH DIFFERENTIAL/PLATELET - Abnormal; Notable for the following components:      Result Value   RBC 3.41 (*)    Hemoglobin 9.7 (*)    HCT 32.0 (*)    RDW 16.1 (*)    Platelets 411 (*)    Neutro Abs 8.1 (*)    All other components within normal limits  COMPREHENSIVE METABOLIC PANEL WITH GFR - Abnormal; Notable for the following components:   Chloride 93 (*)    BUN 25 (*)    Creatinine, Ser 1.47 (*)    Total Protein 6.4 (*)    Albumin 2.9 (*)    GFR, Estimated 36 (*)    Anion gap 19 (*)    All other components within normal limits  URINALYSIS, ROUTINE W REFLEX MICROSCOPIC - Abnormal; Notable for the following components:   Specific Gravity, Urine >1.046 (*)    Hgb urine dipstick SMALL (*)    Ketones, ur 5 (*)    Protein, ur 30 (*)    All other components within normal limits  CULTURE, BLOOD (ROUTINE X 2)  CULTURE, BLOOD (ROUTINE X 2)  I-STAT CG4 LACTIC ACID, ED  I-STAT CG4 LACTIC ACID, ED    EKG None  Radiology CT ABDOMEN PELVIS W WO CONTRAST Result Date: 10/06/2023 CLINICAL DATA:  Abdominal pain. Sigmoid colon stricture. Difficulty eating. Tender abdomen. Anemia. EXAM: CT ABDOMEN AND PELVIS WITHOUT AND WITH CONTRAST TECHNIQUE: Multidetector CT imaging of the abdomen and pelvis was performed following the standard protocol before and following the bolus administration of intravenous contrast. RADIATION DOSE REDUCTION: This exam was performed according to the departmental dose-optimization program which includes automated exposure control, adjustment of the mA and/or kV according to patient size and/or use of iterative reconstruction technique. CONTRAST:  75mL OMNIPAQUE  IOHEXOL  300 MG/ML  SOLN COMPARISON:  08/31/2023 FINDINGS: Lower chest: Very large hiatal hernia containing the majority of the stomach. Passive atelectasis in the left lower lobe. Descending  thoracic aortic atherosclerosis. Hepatobiliary: Cholecystectomy.  Otherwise unremarkable. Pancreas: Unremarkable Spleen: Unremarkable Adrenals/Urinary Tract: Subcentimeter hypodense lesions in the kidneys are too small to characterize although statistically likely to be benign. No further imaging workup of these lesions is indicated. Adrenal glands unremarkable. Stomach/Bowel: The stomach is mainly intrathoracic and only partially included on today's CT of the abdomen. Prominent wall thickening of the sigmoid colon and rectum circumferentially and diffusely with scattered sigmoid colon diverticula which do not appear significantly inflamed themselves. On image 54 of series 10 we demonstrate a rim enhancing 4.7 by  2.4 by 1.9 cm (volume = 11 cm^3) lesion nestled along the loops of sigmoid colon and rectum not containing internal oral contrast medium and accordingly raising suspicion for abscess. This was not definitively present on 08/31/2023 and accordingly tumor (which could appear identical to this) is considered less likely. No internal gas within this structure. This sits along the posterior margin of vaginal cuff. The colon proximal to this inflamed rectosigmoid segment is moderately diffusely distended with cecal diameter at 7.6 cm in diameter and a representative segment of the transverse colon and 5.2 cm. However, the small bowel does not appear dilated. Vascular/Lymphatic: Atherosclerosis is present, including aortoiliac atherosclerotic disease. There is substantial narrowing of the proximal celiac trunk on image 49 series 11 due to atheromatous vascular calcification and median arcuate ligament. 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 Neither vessel appears completely occluded. No pathologic adenopathy observed. Reproductive: Uterus absent. Other: Trace ascites along the inferior right hepatic lobe margin as on image 28 series 7. Musculoskeletal:  Mild dextroconvex lumbar scoliosis with rotary component. Lumbar spondylosis and degenerative disc disease with resulting mild to moderate multilevel impingement in the mid to lower lumbar spine. IMPRESSION: 1. Prominent wall thickening of the sigmoid colon and rectum circumferentially and diffusely with scattered sigmoid colon diverticula which do not appear significantly inflamed themselves. This is compatible with colitis/proctitis. 2. There is a rim enhancing 4.7 by 2.4 by 1.9 cm (volume = 11 cc) lesion along the posterior margin of the vaginal cuff, not containing internal oral contrast medium and accordingly raising suspicion for abscess. This was not definitively present on 08/31/2023 and accordingly tumor (which could appear identical to this) is considered less likely. This presumed abscess is in close proximity to the rectosigmoid. 3. The colon proximal to this inflamed rectosigmoid segment is moderately diffusely distended with cecal diameter at 7.6 cm in diameter and a representative segment of the transverse colon at 5.2 cm. However, the small bowel does not appear dilated. 4. Very large hiatal hernia containing the majority of the stomach. 5. Trace ascites along the inferior right hepatic lobe margin. 6. Substantial narrowing of the proximal celiac trunk due to atheromatous vascular calcification and median arcuate ligament. 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 Aortic Atherosclerosis (ICD10-I70.0). 7. Mild dextroconvex lumbar scoliosis with rotary component. Lumbar spondylosis and degenerative disc disease with resulting mild to moderate multilevel impingement in the mid to lower lumbar spine. Electronically Signed   By: Freida Jes M.D.   On: 10/06/2023 14:36    Procedures Procedures    Medications Ordered in ED Medications  ondansetron  (ZOFRAN ) injection 4 mg (has no administration in time range)  cefTRIAXone (ROCEPHIN) 2  g in sodium chloride  0.9 % 100 mL IVPB (has no administration in time range)    And  metroNIDAZOLE  (FLAGYL ) IVPB 500 mg (has no administration in time range)  ondansetron  (ZOFRAN ) injection 4 mg (4 mg Intravenous Given 10/06/23 1932)  lactated ringers  bolus 250 mL (250 mLs Intravenous New Bag/Given 10/06/23 1937)    ED Course/ Medical Decision Making/ A&P Clinical Course as of 10/06/23 2051  Wed Oct 06, 2023  1904 Stable Abdominal pain Recent colonoscopy  Now with proctitis and abscess  Gen surg and abx? [CC]    Clinical Course User Index [CC] Onetha Bile, MD  Medical Decision Making Risk Prescription drug management.    This patient presents to the ED for concern of abdominal pain, this involves a number of treatment options, and is a complaint that carries with it a high risk of complications and morbidity.  The differential diagnosis includes obstruction, malignancy versus abscess.    Co morbidities: Discussed in HPI   Brief History:  See HPI  EMR reviewed including pt PMHx, past surgical history and past visits to ER.   See HPI for more details   Lab Tests:  I ordered and independently interpreted labs.  The pertinent results include:    Labs notable for CBC with no leukocytosis, hemoglobin is within normal limits.  CMP with no electrolyte derangements after some slight decrease in chloride.  Creatinine level is at her baseline.  LFTs are within normal limits.  Anion gap of 19.  UA with no leukocytes, nitrites present.  Lactic acid is negative, blood cultures have been obtained.   Imaging Studies:  Ct abdomen and pelvis showed: IMPRESSION: 1. Prominent wall thickening of the sigmoid colon and rectum circumferentially and diffusely with scattered sigmoid colon diverticula which do not appear significantly inflamed themselves. This is compatible with colitis/proctitis. 2. There is a rim enhancing 4.7 by 2.4 by 1.9 cm  (volume = 11 cc) lesion along the posterior margin of the vaginal cuff, not containing internal oral contrast medium and accordingly raising suspicion for abscess. This was not definitively present on 08/31/2023 and accordingly tumor (which could appear identical to this) is considered less likely. This presumed abscess is in close proximity to the rectosigmoid. 3. The colon proximal to this inflamed rectosigmoid segment is moderately diffusely distended with cecal diameter at 7.6 cm in diameter and a representative segment of the transverse colon at 5.2 cm. However, the small bowel does not appear dilated. 4. Very large hiatal hernia containing the majority of the stomach. 5. Trace ascites along the inferior right hepatic lobe margin. 6. Substantial narrowing of the proximal celiac trunk due to atheromatous vascular calcification and median arcuate ligament. 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 Aortic Atherosclerosis (ICD10-I70.0). 7. Mild dextroconvex lumbar scoliosis with rotary component. Lumbar spondylosis and degenerative disc disease with resulting mild to moderate multilevel impingement in the mid to lower lumbar spine.   Medicines ordered:  I ordered medication including zofran   for symptomatic treatment. Reevaluation of the patient after these medicines showed that the patient improved I have reviewed the patients home medicines and have made adjustments as needed  Consults:  I requested consultation with general surgery,  and discussed lab and imaging findings as well as pertinent plan - they recommend: Starting antibiotics such as Rocephin, Flagyl .   Reevaluation:  After the interventions noted above I re-evaluated patient and found that they have :stayed the same  Social Determinants of Health:  The patient's social determinants of health were a factor in the care of this patient   Problem List / ED  Course:  Patient presents to the ED with a chief complaint of upper epigastric pain, abnormal CT finding obtained today after PCP appointment.  Patient had a colonoscopy earlier this month, was told that she had some abnormalities there.  Therefore a CT abdomen and pelvis was obtained, this would show malignancy versus abscess.  She has had some undergoing nausea for the last several weeks.  She has not been taking anything at home aside from [facility] powders which do not help with her pain.  Evaluation here today, there is significant tenderness to palpation along the entire abdomen, worse on the upper abdominal area.  She began to actively vomit after my abdominal exam.The rest of her exam, is benign. Labs obtained here CBC with no leukocytosis, hemoglobin is within normal limits.  CMP did not have any electrolyte derangement, creatinine levels unremarkable.  LFTs are within normal limits.  UA without any signs of infection.  Lactic acid was negative.  Blood cultures have been obtained. She was given Zofran , bolus to help with symptomatic treatment.  I did discuss this case with general surgery who recommended admission with IV antibiotics, will likely watch versus intervene. I discussed this case with hospitalist Dr. Brice Campi who is agreeable of admitting patient at this time.  Patient remains hemodynamically stable for admission.  Dispostion:  After consideration of the diagnostic results and the patients response to treatment, I feel that the patent would benefit from admission for further management of her abscess.    Portions of this note were generated with Scientist, clinical (histocompatibility and immunogenetics). Dictation errors may occur despite best attempts at proofreading.   Final Clinical Impression(s) / ED Diagnoses Final diagnoses:  Generalized abdominal pain    Rx / DC Orders ED Discharge Orders     None         Alta Goding, PA-C 10/06/23 2052    Onetha Bile, MD 10/06/23 2315

## 2023-10-06 NOTE — H&P (Signed)
 History and Physical    Kenyette Sanft YQM:[id number] DOB: 05-19-1946 DOA: 10/06/2023  PCP: [name], MD   Patient coming from: Home   Chief Complaint: Abdominal pain  HPI: Vanessa Cisneros is a 78 y.o. female with medical history significant for inflammatory arthritis, depression, CKD 3A, lumbar stenosis, and admission 1 month ago for acute colitis that was treated with antibiotics, now returning with abdominal pain and weight loss.  Patient reports that she had persistent decrease in her appetite as well as fatigue since her admission 1 month ago, but had otherwise been feeling fairly well until she developed abdominal pain with nausea and nonbloody vomiting.  The abdominal pain has become severe and generalized.  She denies fever, chills, melena, or hematochezia.  ED Course: Upon arrival to the ED, patient is found to be afebrile with normal HR and stable BP.  Labs are most notable for creatinine 1.47, normal WBC, platelets 411,000, and normal lactic acid.  CT demonstrates rectosigmoid wall thickening with 4.7 x 2.9 x 1.9 cm rim-enhancing lesion suspicious for abscess.  Surgery (Dr. Davonna Estes) was consulted by the ED physician, blood culture was collected, the patient was given IV fluids, Zofran , Rocephin, and Flagyl .  Review of Systems:  All other systems reviewed and apart from HPI, are negative.  Past Medical History:  Diagnosis Date   Anemia    Arthritis    CHF (congestive heart failure) (HCC)    Depression    Esophageal reflux    GERD (gastroesophageal reflux disease)    Hyperlipidemia, mixed    Immunodeficiency (cell-mediated) (HCC)    Inflammatory polyarthropathy (HCC)    Lumbar stenosis with neurogenic claudication     Past Surgical History:  Procedure Laterality Date   ABDOMINAL HYSTERECTOMY     BREAST BIOPSY Left 06/08/2012   neg   CHOLECYSTECTOMY     COLONOSCOPY WITH PROPOFOL  N/A 07/21/2023   Procedure: COLONOSCOPY WITH PROPOFOL ;  Surgeon: Toledo,  Alphonsus Jeans, MD;  Location: [facility] ENDOSCOPY;  Service: Gastroenterology;  Laterality: N/A;   ESOPHAGOGASTRODUODENOSCOPY (EGD) WITH PROPOFOL  N/A 10/14/2015   Procedure: ESOPHAGOGASTRODUODENOSCOPY (EGD) WITH PROPOFOL ;  Surgeon: Cassie Click, MD;  Location: [facility] ENDOSCOPY;  Service: Endoscopy;  Laterality: N/A;   ESOPHAGOGASTRODUODENOSCOPY (EGD) WITH PROPOFOL  N/A 07/21/2023   Procedure: ESOPHAGOGASTRODUODENOSCOPY (EGD) WITH PROPOFOL ;  Surgeon: Toledo, Alphonsus Jeans, MD;  Location: [facility] ENDOSCOPY;  Service: Gastroenterology;  Laterality: N/A;   TUMOR REMOVAL N/A    stomach    Social History:   reports that she has never smoked. She does not have any smokeless tobacco history on file. She reports that she does not drink alcohol and does not use drugs.  Allergies  Allergen Reactions   Levofloxacin     Patient stated that it makes her extremely sore, and she can hardly move.    Tramadol     History reviewed. No pertinent family history.   Prior to Admission medications   Medication Sig Start Date End Date Taking? Authorizing Provider  Calcium  Carbonate-Vitamin D (CALCIUM  600 + D PO) Take 1 tablet by mouth 2 (two) times daily.      [provider]  donepezil (ARICEPT) 5 MG tablet Take 1 tablet by mouth at bedtime. 07/19/23 07/18/24  [provider]  fish oil-omega-3 fatty acids 1000 MG capsule Take 2 g by mouth 2 (two) times daily.      [provider]  fluticasone (FLONASE) 50 MCG/ACT nasal spray Place 2 sprays into both nostrils daily. 07/07/23   [provider]  hydroxychloroquine (PLAQUENIL) 200 MG tablet Take 200 mg by mouth daily.    [provider]  lovastatin (MEVACOR) 20 MG tablet Take 20 mg by mouth at bedtime.      [provider]  omeprazole (PRILOSEC) 20 MG capsule Take 20 mg by mouth daily.      [provider]  sucralfate (CARAFATE) 1 g tablet Take 1 g by mouth 2 (two) times daily.    [provider]  venlafaxine  XR (EFFEXOR-XR) 75 MG 24 hr capsule Take 75 mg by mouth daily.    [provider]    Physical Exam: Vitals:   10/06/23 1725 10/06/23 1804 10/06/23 1910  BP: (!) 126/42  (!) 155/68  Pulse: 93  87  Resp: 16  18  Temp: 98.1 F (36.7 C)    SpO2: 92%  97%  Weight:  38.6 kg   Height:  4\' 11"  (1.499 m)     Constitutional: NAD, calm  Eyes: PERTLA, lids and conjunctivae normal ENMT: Mucous membranes are moist. Posterior pharynx clear of any exudate or lesions.   Neck: supple, no masses  Respiratory: no wheezing, no crackles. No accessory muscle use.  Cardiovascular: S1 & S2 heard, regular rate and rhythm. Mild lower extremity edema.  Abdomen: Tender with voluntary guarding. Bowel sounds active.  Musculoskeletal: no clubbing / cyanosis. No joint deformity upper and lower extremities.   Skin: no significant rashes, lesions, ulcers. Warm, dry, well-perfused. Neurologic: CN 2-12 grossly intact. Moving all extremities. Alert and oriented.  Psychiatric: Pleasant. Cooperative.    Labs and Imaging on Admission: I have personally reviewed following labs and imaging studies  CBC: Recent Labs  Lab 10/06/23 1812  WBC 9.6  NEUTROABS 8.1*  HGB 9.7*  HCT 32.0*  MCV 93.8  PLT 411*   Basic Metabolic Panel: Recent Labs  Lab 10/06/23 1812  NA 137  K 3.6  CL 93*  CO2 25  GLUCOSE 80  BUN 25*  CREATININE 1.47*  CALCIUM  9.6   GFR: Estimated Creatinine Clearance: 18.6 mL/min (A) (by C-G formula based on SCr of 1.47 mg/dL (H)). Liver Function Tests: Recent Labs  Lab 10/06/23 1812  AST 20  ALT 13  ALKPHOS 49  BILITOT 0.5  PROT 6.4*  ALBUMIN 2.9*   No results for input(s): "LIPASE", "AMYLASE" in the last 168 hours. No results for input(s): "AMMONIA" in the last 168 hours. Coagulation Profile: No results for input(s): "INR", "PROTIME" in the last 168 hours. Cardiac Enzymes: No results for input(s): "CKTOTAL", "CKMB", "CKMBINDEX", "TROPONINI" in the last 168 hours. BNP  (last 3 results) No results for input(s): "PROBNP" in the last 8760 hours. HbA1C: No results for input(s): "HGBA1C" in the last 72 hours. CBG: No results for input(s): "GLUCAP" in the last 168 hours. Lipid Profile: No results for input(s): "CHOL", "HDL", "LDLCALC", "TRIG", "CHOLHDL", "LDLDIRECT" in the last 72 hours. Thyroid  Function Tests: No results for input(s): "TSH", "T4TOTAL", "FREET4", "T3FREE", "THYROIDAB" in the last 72 hours. Anemia Panel: No results for input(s): "VITAMINB12", "FOLATE", "FERRITIN", "TIBC", "IRON", "RETICCTPCT" in the last 72 hours. Urine analysis:    Component Value Date/Time   COLORURINE YELLOW 10/06/2023 1812   APPEARANCEUR CLEAR 10/06/2023 1812   LABSPEC >1.046 (H) 10/06/2023 1812   PHURINE 6.0 10/06/2023 1812   GLUCOSEU NEGATIVE 10/06/2023 1812   HGBUR SMALL (A) 10/06/2023 1812   BILIRUBINUR NEGATIVE 10/06/2023 1812   KETONESUR 5 (A) 10/06/2023 1812   PROTEINUR 30 (A) 10/06/2023 1812   NITRITE NEGATIVE 10/06/2023 1812  LEUKOCYTESUR NEGATIVE 10/06/2023 1812   Sepsis Labs: @LABRCNTIP (procalcitonin:4,lacticidven:4) )No results found for this or any previous visit (from the past 240 hours).   Radiological Exams on Admission: CT ABDOMEN PELVIS W WO CONTRAST Result Date: 10/06/2023 CLINICAL DATA:  Abdominal pain. Sigmoid colon stricture. Difficulty eating. Tender abdomen. Anemia. EXAM: CT ABDOMEN AND PELVIS WITHOUT AND WITH CONTRAST TECHNIQUE: Multidetector CT imaging of the abdomen and pelvis was performed following the standard protocol before and following the bolus administration of intravenous contrast. RADIATION DOSE REDUCTION: This exam was performed according to the departmental dose-optimization program which includes automated exposure control, adjustment of the mA and/or kV according to patient size and/or use of iterative reconstruction technique. CONTRAST:  75mL OMNIPAQUE  IOHEXOL  300 MG/ML  SOLN COMPARISON:  08/31/2023 FINDINGS: Lower chest:  Very large hiatal hernia containing the majority of the stomach. Passive atelectasis in the left lower lobe. Descending thoracic aortic atherosclerosis. Hepatobiliary: Cholecystectomy.  Otherwise unremarkable. Pancreas: Unremarkable Spleen: Unremarkable Adrenals/Urinary Tract: Subcentimeter hypodense lesions in the kidneys are too small to characterize although statistically likely to be benign. No further imaging workup of these lesions is indicated. Adrenal glands unremarkable. Stomach/Bowel: The stomach is mainly intrathoracic and only partially included on today's CT of the abdomen. Prominent wall thickening of the sigmoid colon and rectum circumferentially and diffusely with scattered sigmoid colon diverticula which do not appear significantly inflamed themselves. On image 54 of series 10 we demonstrate a rim enhancing 4.7 by 2.4 by 1.9 cm (volume = 11 cm^3) lesion nestled along the loops of sigmoid colon and rectum not containing internal oral contrast medium and accordingly raising suspicion for abscess. This was not definitively present on 08/31/2023 and accordingly tumor (which could appear identical to this) is considered less likely. No internal gas within this structure. This sits along the posterior margin of vaginal cuff. The colon proximal to this inflamed rectosigmoid segment is moderately diffusely distended with cecal diameter at 7.6 cm in diameter and a representative segment of the transverse colon and 5.2 cm. However, the small bowel does not appear dilated. Vascular/Lymphatic: Atherosclerosis is present, including aortoiliac atherosclerotic disease. There is substantial narrowing of the proximal celiac trunk on image 49 series 11 due to atheromatous vascular calcification and median arcuate ligament. 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 Neither vessel appears completely occluded. No pathologic adenopathy observed. Reproductive:  Uterus absent. Other: Trace ascites along the inferior right hepatic lobe margin as on image 28 series 7. Musculoskeletal: Mild dextroconvex lumbar scoliosis with rotary component. Lumbar spondylosis and degenerative disc disease with resulting mild to moderate multilevel impingement in the mid to lower lumbar spine. IMPRESSION: 1. Prominent wall thickening of the sigmoid colon and rectum circumferentially and diffusely with scattered sigmoid colon diverticula which do not appear significantly inflamed themselves. This is compatible with colitis/proctitis. 2. There is a rim enhancing 4.7 by 2.4 by 1.9 cm (volume = 11 cc) lesion along the posterior margin of the vaginal cuff, not containing internal oral contrast medium and accordingly raising suspicion for abscess. This was not definitively present on 08/31/2023 and accordingly tumor (which could appear identical to this) is considered less likely. This presumed abscess is in close proximity to the rectosigmoid. 3. The colon proximal to this inflamed rectosigmoid segment is moderately diffusely distended with cecal diameter at 7.6 cm in diameter and a representative segment of the transverse colon at 5.2 cm. However, the small bowel does not appear dilated. 4. Very large hiatal hernia containing the majority of  the stomach. 5. Trace ascites along the inferior right hepatic lobe margin. 6. Substantial narrowing of the proximal celiac trunk due to atheromatous vascular calcification and median arcuate ligament. 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 Aortic Atherosclerosis (ICD10-I70.0). 7. Mild dextroconvex lumbar scoliosis with rotary component. Lumbar spondylosis and degenerative disc disease with resulting mild to moderate multilevel impingement in the mid to lower lumbar spine. Electronically Signed   By: Freida Jes M.D.   On: 10/06/2023 14:36    Assessment/Plan   1. Colitis with abscess     - Continue bowel-rest and antibiotics, follow-up on surgeon's recommendations    2. AKI superimposed on CKD 3A  - SCr is 1.47 on admission, baseline appears closer to 1.0  - Continue IVF hydration, renally-dose medications, repeat chem panel in am    3. Inflammatory arthritis  - Hold Plaquenil for now     DVT prophylaxis: SCDs  Code Status: Full  Level of Care: Level of care: Med-Surg Family Communication: None present   Disposition Plan:  Patient is from: home  Anticipated d/c is to: Home  Anticipated d/c date is: 10/09/23  Patient currently: Pending surgical consult, clinical improvement/stability  Consults called: Surgery  Admission status: Inpatient     Walton Guppy, MD [facility]  10/06/2023, 9:16 PM

## 2023-10-06 NOTE — Consult Note (Addendum)
 Vanessa Cisneros Oct 02, 1945  [id number].    Requesting MD: Opyd Chief Complaint/Reason for Consult: Colitis with abscess  HPI:  78 y/o F w/ a hx of CKD, lumbar stenosis, and inflammatory arthritis who presented to the ED with 1 month of general malaise and diarrhea following a recent admission for colitis.  She underwent an attempted screening colonoscopy in February. There was significant narrowing of the sigmoid colon and the procedure was aborted. She then presented to the hospital in March with significant diarrhea while attempting to prep for a CT colonoscopy.  A CT showed a long segment of inflammation in the rectosigmoid and she was treated for colitis. She reports that since discharge she has continued to experience general malaise. She has had frequent diarrhea and intermittent crampy, sharp pain. She has lost more than 10lb per her report.  She denies fevers or palpitations.  A CT today shows signs of diverticulitis w/ a fluid collection that was not present on her last scan. WBC 10. She is AF and HDS.  On exam, patient is resting comfortably in bed. She reports that her pain is well controlled at the moment with IV pain meds. She had colonoscopies in 2003 and 2013 showing diverticulosis w/o another abnormalities.   Of note, patient also has a history of esophageal dysmotility and a large hiatal hernia.   ROS: Review of Systems  Constitutional:  Positive for malaise/fatigue and weight loss.  HENT: Negative.    Eyes: Negative.   Respiratory: Negative.    Cardiovascular: Negative.   Gastrointestinal:  Positive for abdominal pain and diarrhea. Negative for blood in stool.  Genitourinary: Negative.   Musculoskeletal: Negative.   Skin: Negative.   Neurological: Negative.   Endo/Heme/Allergies: Negative.   Psychiatric/Behavioral: Negative.      History reviewed. No pertinent family history.  Past Medical History:  Diagnosis Date   Anemia    Arthritis    CHF  (congestive heart failure) (HCC)    Depression    Esophageal reflux    GERD (gastroesophageal reflux disease)    Hyperlipidemia, mixed    Immunodeficiency (cell-mediated) (HCC)    Inflammatory polyarthropathy (HCC)    Lumbar stenosis with neurogenic claudication     Past Surgical History:  Procedure Laterality Date   ABDOMINAL HYSTERECTOMY     BREAST BIOPSY Left 06/08/2012   neg   CHOLECYSTECTOMY     COLONOSCOPY WITH PROPOFOL  N/A 07/21/2023   Procedure: COLONOSCOPY WITH PROPOFOL ;  Surgeon: Toledo, Alphonsus Jeans, MD;  Location: [facility] ENDOSCOPY;  Service: Gastroenterology;  Laterality: N/A;   ESOPHAGOGASTRODUODENOSCOPY (EGD) WITH PROPOFOL  N/A 10/14/2015   Procedure: ESOPHAGOGASTRODUODENOSCOPY (EGD) WITH PROPOFOL ;  Surgeon: Cassie Click, MD;  Location: [facility] ENDOSCOPY;  Service: Endoscopy;  Laterality: N/A;   ESOPHAGOGASTRODUODENOSCOPY (EGD) WITH PROPOFOL  N/A 07/21/2023   Procedure: ESOPHAGOGASTRODUODENOSCOPY (EGD) WITH PROPOFOL ;  Surgeon: Toledo, Alphonsus Jeans, MD;  Location: [facility] ENDOSCOPY;  Service: Gastroenterology;  Laterality: N/A;   TUMOR REMOVAL N/A    stomach    Social History:  reports that she has never smoked. She does not have any smokeless tobacco history on file. She reports that she does not drink alcohol and does not use drugs.  Allergies:  Allergies  Allergen Reactions   Levofloxacin     Patient stated that it makes her extremely sore, and she can hardly move.    Tramadol     Medications Prior to Admission  Medication Sig Dispense Refill   Calcium  Carbonate-Vitamin D (CALCIUM  600 + D PO) Take 1  tablet by mouth 2 (two) times daily.       donepezil (ARICEPT) 5 MG tablet Take 1 tablet by mouth at bedtime.     fish oil-omega-3 fatty acids 1000 MG capsule Take 2 g by mouth 2 (two) times daily.       fluticasone (FLONASE) 50 MCG/ACT nasal spray Place 2 sprays into both nostrils daily.     hydroxychloroquine (PLAQUENIL) 200 MG tablet Take 200 mg by mouth daily.      lovastatin (MEVACOR) 20 MG tablet Take 20 mg by mouth at bedtime.       omeprazole (PRILOSEC) 20 MG capsule Take 20 mg by mouth daily.       sucralfate (CARAFATE) 1 g tablet Take 1 g by mouth 2 (two) times daily.     venlafaxine XR (EFFEXOR-XR) 75 MG 24 hr capsule Take 75 mg by mouth daily.      Physical Exam: Blood pressure 138/86, pulse 97, temperature 98.3 F (36.8 C), temperature source Oral, resp. rate 16, height 4\' 11"  (1.499 m), weight 38.6 kg, SpO2 95%. Gen: female, NAD Abd: soft, non-distended, mild TTP in the LLQ, no rebound/guarding, no peritoneal signs  Results for orders placed or performed during the hospital encounter of 10/06/23 (from the past 48 hours)  CBC with Differential     Status: Abnormal   Collection Time: 10/06/23  6:12 PM  Result Value Ref Range   WBC 9.6 4.0 - 10.5 K/uL   RBC 3.41 (L) 3.87 - 5.11 MIL/uL   Hemoglobin 9.7 (L) 12.0 - 15.0 g/dL   HCT 69.6 (L) 29.5 - 28.4 %   MCV 93.8 80.0 - 100.0 fL   MCH 28.4 26.0 - 34.0 pg   MCHC 30.3 30.0 - 36.0 g/dL   RDW 13.2 (H) 44.0 - 10.2 %   Platelets 411 (H) 150 - 400 K/uL   nRBC 0.0 0.0 - 0.2 %   Neutrophils Relative % 84 %   Neutro Abs 8.1 (H) 1.7 - 7.7 K/uL   Lymphocytes Relative 7 %   Lymphs Abs 0.7 0.7 - 4.0 K/uL   Monocytes Relative 8 %   Monocytes Absolute 0.7 0.1 - 1.0 K/uL   Eosinophils Relative 0 %   Eosinophils Absolute 0.0 0.0 - 0.5 K/uL   Basophils Relative 1 %   Basophils Absolute 0.1 0.0 - 0.1 K/uL   Immature Granulocytes 0 %   Abs Immature Granulocytes 0.02 0.00 - 0.07 K/uL    Comment: Performed at [facility], [address], [place], [place] [address]  Comprehensive metabolic panel     Status: Abnormal   Collection Time: 10/06/23  6:12 PM  Result Value Ref Range   Sodium 137 135 - 145 mmol/L   Potassium 3.6 3.5 - 5.1 mmol/L   Chloride 93 (L) 98 - 111 mmol/L   CO2 25 22 - 32 mmol/L   Glucose, Bld 80 70 - 99 mg/dL    Comment: Glucose reference range applies only to samples taken  after fasting for at least 8 hours.   BUN 25 (H) 8 - 23 mg/dL   Creatinine, Ser 6.44 (H) 0.44 - 1.00 mg/dL   Calcium  9.6 8.9 - 10.3 mg/dL   Total Protein 6.4 (L) 6.5 - 8.1 g/dL   Albumin 2.9 (L) 3.5 - 5.0 g/dL   AST 20 15 - 41 U/L   ALT 13 0 - 44 U/L   Alkaline Phosphatase 49 38 - 126 U/L   Total Bilirubin 0.5 0.0 - 1.2  mg/dL   GFR, Estimated 36 (L) >60 mL/min    Comment: (NOTE) Calculated using the CKD-EPI Creatinine Equation (2021)    Anion gap 19 (H) 5 - 15    Comment: Performed at [facility], [address], [place], [place] [address]  Urinalysis, Routine w reflex microscopic -Urine, Clean Catch     Status: Abnormal   Collection Time: 10/06/23  6:12 PM  Result Value Ref Range   Color, Urine YELLOW YELLOW   APPearance CLEAR CLEAR   Specific Gravity, Urine >1.046 (H) 1.005 - 1.030   pH 6.0 5.0 - 8.0   Glucose, UA NEGATIVE NEGATIVE mg/dL   Hgb urine dipstick SMALL (A) NEGATIVE   Bilirubin Urine NEGATIVE NEGATIVE   Ketones, ur 5 (A) NEGATIVE mg/dL   Protein, ur 30 (A) NEGATIVE mg/dL   Nitrite NEGATIVE NEGATIVE   Leukocytes,Ua NEGATIVE NEGATIVE   RBC / HPF 0-5 0 - 5 RBC/hpf   WBC, UA 0-5 0 - 5 WBC/hpf   Bacteria, UA NONE SEEN NONE SEEN   Squamous Epithelial / HPF 0-5 0 - 5 /HPF    Comment: Performed at [facility], [address], [place], [place] [address]  I-Stat CG4 Lactic Acid     Status: None   Collection Time: 10/06/23  7:22 PM  Result Value Ref Range   Lactic Acid, Venous 1.1 0.5 - 1.9 mmol/L   CT ABDOMEN PELVIS W WO CONTRAST Result Date: 10/06/2023 CLINICAL DATA:  Abdominal pain. Sigmoid colon stricture. Difficulty eating. Tender abdomen. Anemia. EXAM: CT ABDOMEN AND PELVIS WITHOUT AND WITH CONTRAST TECHNIQUE: Multidetector CT imaging of the abdomen and pelvis was performed following the standard protocol before and following the bolus administration of intravenous contrast. RADIATION DOSE REDUCTION: This exam was performed according to the departmental  dose-optimization program which includes automated exposure control, adjustment of the mA and/or kV according to patient size and/or use of iterative reconstruction technique. CONTRAST:  75mL OMNIPAQUE  IOHEXOL  300 MG/ML  SOLN COMPARISON:  08/31/2023 FINDINGS: Lower chest: Very large hiatal hernia containing the majority of the stomach. Passive atelectasis in the left lower lobe. Descending thoracic aortic atherosclerosis. Hepatobiliary: Cholecystectomy.  Otherwise unremarkable. Pancreas: Unremarkable Spleen: Unremarkable Adrenals/Urinary Tract: Subcentimeter hypodense lesions in the kidneys are too small to characterize although statistically likely to be benign. No further imaging workup of these lesions is indicated. Adrenal glands unremarkable. Stomach/Bowel: The stomach is mainly intrathoracic and only partially included on today's CT of the abdomen. Prominent wall thickening of the sigmoid colon and rectum circumferentially and diffusely with scattered sigmoid colon diverticula which do not appear significantly inflamed themselves. On image 54 of series 10 we demonstrate a rim enhancing 4.7 by 2.4 by 1.9 cm (volume = 11 cm^3) lesion nestled along the loops of sigmoid colon and rectum not containing internal oral contrast medium and accordingly raising suspicion for abscess. This was not definitively present on 08/31/2023 and accordingly tumor (which could appear identical to this) is considered less likely. No internal gas within this structure. This sits along the posterior margin of vaginal cuff. The colon proximal to this inflamed rectosigmoid segment is moderately diffusely distended with cecal diameter at 7.6 cm in diameter and a representative segment of the transverse colon and 5.2 cm. However, the small bowel does not appear dilated. Vascular/Lymphatic: Atherosclerosis is present, including aortoiliac atherosclerotic disease. There is substantial narrowing of the proximal celiac trunk on image 49 series  11 due to atheromatous vascular calcification and median arcuate ligament. 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 Neither vessel appears completely occluded. No pathologic adenopathy observed. Reproductive: Uterus absent. Other: Trace ascites along the inferior right hepatic lobe margin as on image 28 series 7. Musculoskeletal: Mild dextroconvex lumbar scoliosis with rotary component. Lumbar spondylosis and degenerative disc disease with resulting mild to moderate multilevel impingement in the mid to lower lumbar spine. IMPRESSION: 1. Prominent wall thickening of the sigmoid colon and rectum circumferentially and diffusely with scattered sigmoid colon diverticula which do not appear significantly inflamed themselves. This is compatible with colitis/proctitis. 2. There is a rim enhancing 4.7 by 2.4 by 1.9 cm (volume = 11 cc) lesion along the posterior margin of the vaginal cuff, not containing internal oral contrast medium and accordingly raising suspicion for abscess. This was not definitively present on 08/31/2023 and accordingly tumor (which could appear identical to this) is considered less likely. This presumed abscess is in close proximity to the rectosigmoid. 3. The colon proximal to this inflamed rectosigmoid segment is moderately diffusely distended with cecal diameter at 7.6 cm in diameter and a representative segment of the transverse colon at 5.2 cm. However, the small bowel does not appear dilated. 4. Very large hiatal hernia containing the majority of the stomach. 5. Trace ascites along the inferior right hepatic lobe margin. 6. Substantial narrowing of the proximal celiac trunk due to atheromatous vascular calcification and median arcuate ligament. 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 Aortic Atherosclerosis (ICD10-I70.0). 7. Mild dextroconvex lumbar scoliosis with rotary  component. Lumbar spondylosis and degenerative disc disease with resulting mild to moderate multilevel impingement in the mid to lower lumbar spine. Electronically Signed   By: Freida Jes M.D.   On: 10/06/2023 14:36    Assessment/Plan 78 y/o F who presents with general malaise, weight loss, and persistent abdominal pain 1 month after being treated for colitis.  Rectosigmoid inflammation and fluid collection - IV abx, will discuss possible drainage with IR in the AM. Monitor abdominal exam Sigmoid stricture - GI consult, will need further evaluation for malignancy versus benign stricture pending clinical course Hiatal hernia w/ dysphagia - No indication for urgent surgical intervention at this time. Continue PPI  FEN - NPO VTE - DVT PPX okay from surgery perspective ID - Rocephin/Flagyl  Admit - to medicine, surgery will follow   Tr[facility]able Surgery 10/06/2023, 10:43 PM Please see Amion for pager number during day hours 7:00am-4:30pm or 7:00am -11:30am on weekends

## 2023-10-07 ENCOUNTER — Encounter (HOSPITAL_COMMUNITY): Payer: Self-pay | Admitting: Family Medicine

## 2023-10-07 DIAGNOSIS — M064 Inflammatory polyarthropathy: Secondary | ICD-10-CM | POA: Diagnosis not present

## 2023-10-07 DIAGNOSIS — F32A Depression, unspecified: Secondary | ICD-10-CM

## 2023-10-07 DIAGNOSIS — K529 Noninfective gastroenteritis and colitis, unspecified: Secondary | ICD-10-CM | POA: Diagnosis not present

## 2023-10-07 DIAGNOSIS — N179 Acute kidney failure, unspecified: Secondary | ICD-10-CM | POA: Diagnosis not present

## 2023-10-07 DIAGNOSIS — K56699 Other intestinal obstruction unspecified as to partial versus complete obstruction: Secondary | ICD-10-CM | POA: Insufficient documentation

## 2023-10-07 LAB — CBC
HCT: 28.9 % — ABNORMAL LOW (ref 36.0–46.0)
Hemoglobin: 9 g/dL — ABNORMAL LOW (ref 12.0–15.0)
MCH: 28.3 pg (ref 26.0–34.0)
MCHC: 31.1 g/dL (ref 30.0–36.0)
MCV: 90.9 fL (ref 80.0–100.0)
Platelets: 395 10*3/uL (ref 150–400)
RBC: 3.18 MIL/uL — ABNORMAL LOW (ref 3.87–5.11)
RDW: 15.9 % — ABNORMAL HIGH (ref 11.5–15.5)
WBC: 11.9 10*3/uL — ABNORMAL HIGH (ref 4.0–10.5)
nRBC: 0 % (ref 0.0–0.2)

## 2023-10-07 LAB — BASIC METABOLIC PANEL WITH GFR
Anion gap: 17 — ABNORMAL HIGH (ref 5–15)
BUN: 27 mg/dL — ABNORMAL HIGH (ref 8–23)
CO2: 26 mmol/L (ref 22–32)
Calcium: 9.1 mg/dL (ref 8.9–10.3)
Chloride: 93 mmol/L — ABNORMAL LOW (ref 98–111)
Creatinine, Ser: 1.33 mg/dL — ABNORMAL HIGH (ref 0.44–1.00)
GFR, Estimated: 40 mL/min — ABNORMAL LOW (ref >60)
Glucose, Bld: 103 mg/dL — ABNORMAL HIGH (ref 70–99)
Potassium: 3.6 mmol/L (ref 3.5–5.1)
Sodium: 136 mmol/L (ref 135–145)

## 2023-10-07 LAB — MAGNESIUM: Magnesium: 2.4 mg/dL (ref 1.7–2.4)

## 2023-10-07 MED ORDER — PROCHLORPERAZINE EDISYLATE 10 MG/2ML IJ SOLN
5.0000 mg | Freq: Four times a day (QID) | INTRAMUSCULAR | Status: DC | PRN
  Administered 2023-10-07: 5 mg via INTRAVENOUS

## 2023-10-07 MED ORDER — DONEPEZIL HCL 10 MG PO TABS
5.0000 mg | ORAL_TABLET | Freq: Every day | ORAL | Status: DC
  Administered 2023-10-07 – 2023-10-10 (×4): 5 mg via ORAL
  Filled 2023-10-07 (×4): qty 1

## 2023-10-07 MED ORDER — ONDANSETRON HCL 4 MG/2ML IJ SOLN
4.0000 mg | Freq: Four times a day (QID) | INTRAMUSCULAR | Status: DC | PRN
  Administered 2023-10-10: 4 mg via INTRAVENOUS
  Filled 2023-10-07: qty 2

## 2023-10-07 MED ORDER — VENLAFAXINE HCL ER 75 MG PO CP24
75.0000 mg | ORAL_CAPSULE | Freq: Every day | ORAL | Status: DC
Start: 2023-10-07 — End: 2023-10-11
  Administered 2023-10-07 – 2023-10-11 (×5): 75 mg via ORAL
  Filled 2023-10-07 (×5): qty 1

## 2023-10-07 NOTE — H&P (View-Only) (Signed)
 Progress Note     Subjective: Very sleepy this am. Abdominal pain is a little better than when she first came in but still significant. Had episode of emesis last night. No n/v this am but she has been NPO and trying to sleep. Passing some flatus but feels like it is decreasing. Passed multiple small loose bowel movements   Objective: Vital signs in last 24 hours: Temp:  [97.6 F (36.4 C)-98.3 F (36.8 C)] 97.6 F (36.4 C) (05/01 0923) Pulse Rate:  [85-98] 88 (05/01 0923) Resp:  [16-18] 16 (04/30 2330) BP: (112-155)/(42-86) 131/59 (05/01 0923) SpO2:  [90 %-97 %] 90 % (05/01 0923) Weight:  [38.6 kg] 38.6 kg (04/30 1804) Last BM Date : 10/07/23  Intake/Output from previous day: 04/30 0701 - 05/01 0700 In: 820.1 [P.O.:120; I.V.:449.9; IV Piggyback:250.2] Out: 30 [Emesis/NG output:30] Intake/Output this shift: Total I/O In: 50 [P.O.:50] Out: -   PE: General: pleasant, WD, female who is laying in bed in NAD Lungs:  Respiratory effort nonlabored Abd: soft, moderate distension of lower abdomen which is moderately TTP without peritonitis. Well healed infraumbilical midline surgical scar MSK: all 4 extremities are symmetrical with no cyanosis, clubbing, or edema. Skin: warm and dry Psych: A&Ox3 with an appropriate affect.    Lab Results:  Recent Labs    10/06/23 1812 10/07/23 0645  WBC 9.6 11.9*  HGB 9.7* 9.0*  HCT 32.0* 28.9*  PLT 411* 395   BMET Recent Labs    10/06/23 1812 10/07/23 0645  NA 137 136  K 3.6 3.6  CL 93* 93*  CO2 25 26  GLUCOSE 80 103*  BUN 25* 27*  CREATININE 1.47* 1.33*  CALCIUM  9.6 9.1   PT/INR No results for input(s): "LABPROT", "INR" in the last 72 hours. CMP     Component Value Date/Time   NA 136 10/07/2023 0645   K 3.6 10/07/2023 0645   CL 93 (L) 10/07/2023 0645   CO2 26 10/07/2023 0645   GLUCOSE 103 (H) 10/07/2023 0645   BUN 27 (H) 10/07/2023 0645   CREATININE 1.33 (H) 10/07/2023 0645   CALCIUM  9.1 10/07/2023 0645   PROT  6.4 (L) 10/06/2023 1812   ALBUMIN 2.9 (L) 10/06/2023 1812   AST 20 10/06/2023 1812   ALT 13 10/06/2023 1812   ALKPHOS 49 10/06/2023 1812   BILITOT 0.5 10/06/2023 1812   GFRNONAA 40 (L) 10/07/2023 0645   GFRAA 59 (L) 09/30/2015 1105   Lipase     Component Value Date/Time   LIPASE 40 08/31/2023 2144       Studies/Results: CT ABDOMEN PELVIS W WO CONTRAST Result Date: 10/06/2023 CLINICAL DATA:  Abdominal pain. Sigmoid colon stricture. Difficulty eating. Tender abdomen. Anemia. EXAM: CT ABDOMEN AND PELVIS WITHOUT AND WITH CONTRAST TECHNIQUE: Multidetector CT imaging of the abdomen and pelvis was performed following the standard protocol before and following the bolus administration of intravenous contrast. RADIATION DOSE REDUCTION: This exam was performed according to the departmental dose-optimization program which includes automated exposure control, adjustment of the mA and/or kV according to patient size and/or use of iterative reconstruction technique. CONTRAST:  75mL OMNIPAQUE  IOHEXOL  300 MG/ML  SOLN COMPARISON:  08/31/2023 FINDINGS: Lower chest: Very large hiatal hernia containing the majority of the stomach. Passive atelectasis in the left lower lobe. Descending thoracic aortic atherosclerosis. Hepatobiliary: Cholecystectomy.  Otherwise unremarkable. Pancreas: Unremarkable Spleen: Unremarkable Adrenals/Urinary Tract: Subcentimeter hypodense lesions in the kidneys are too small to characterize although statistically likely to be benign. No further imaging workup of these lesions  is indicated. Adrenal glands unremarkable. Stomach/Bowel: The stomach is mainly intrathoracic and only partially included on today's CT of the abdomen. Prominent wall thickening of the sigmoid colon and rectum circumferentially and diffusely with scattered sigmoid colon diverticula which do not appear significantly inflamed themselves. On image 54 of series 10 we demonstrate a rim enhancing 4.7 by 2.4 by 1.9 cm (volume  = 11 cm^3) lesion nestled along the loops of sigmoid colon and rectum not containing internal oral contrast medium and accordingly raising suspicion for abscess. This was not definitively present on 08/31/2023 and accordingly tumor (which could appear identical to this) is considered less likely. No internal gas within this structure. This sits along the posterior margin of vaginal cuff. The colon proximal to this inflamed rectosigmoid segment is moderately diffusely distended with cecal diameter at 7.6 cm in diameter and a representative segment of the transverse colon and 5.2 cm. However, the small bowel does not appear dilated. Vascular/Lymphatic: Atherosclerosis is present, including aortoiliac atherosclerotic disease. There is substantial narrowing of the proximal celiac trunk on image 49 series 11 due to atheromatous vascular calcification and median arcuate ligament. 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 Neither vessel appears completely occluded. No pathologic adenopathy observed. Reproductive: Uterus absent. Other: Trace ascites along the inferior right hepatic lobe margin as on image 28 series 7. Musculoskeletal: Mild dextroconvex lumbar scoliosis with rotary component. Lumbar spondylosis and degenerative disc disease with resulting mild to moderate multilevel impingement in the mid to lower lumbar spine. IMPRESSION: 1. Prominent wall thickening of the sigmoid colon and rectum circumferentially and diffusely with scattered sigmoid colon diverticula which do not appear significantly inflamed themselves. This is compatible with colitis/proctitis. 2. There is a rim enhancing 4.7 by 2.4 by 1.9 cm (volume = 11 cc) lesion along the posterior margin of the vaginal cuff, not containing internal oral contrast medium and accordingly raising suspicion for abscess. This was not definitively present on 08/31/2023 and accordingly tumor (which could appear  identical to this) is considered less likely. This presumed abscess is in close proximity to the rectosigmoid. 3. The colon proximal to this inflamed rectosigmoid segment is moderately diffusely distended with cecal diameter at 7.6 cm in diameter and a representative segment of the transverse colon at 5.2 cm. However, the small bowel does not appear dilated. 4. Very large hiatal hernia containing the majority of the stomach. 5. Trace ascites along the inferior right hepatic lobe margin. 6. Substantial narrowing of the proximal celiac trunk due to atheromatous vascular calcification and median arcuate ligament. 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 Aortic Atherosclerosis (ICD10-I70.0). 7. Mild dextroconvex lumbar scoliosis with rotary component. Lumbar spondylosis and degenerative disc disease with resulting mild to moderate multilevel impingement in the mid to lower lumbar spine. Electronically Signed   By: Freida Jes M.D.   On: 10/06/2023 14:36    Anti-infectives: Anti-infectives (From admission, onward)    Start     Dose/Rate Route Frequency Ordered Stop   10/07/23 2030  cefTRIAXone  (ROCEPHIN ) 2 g in sodium chloride  0.9 % 100 mL IVPB        2 g 200 mL/hr over 30 Minutes Intravenous Every 24 hours 10/06/23 2116     10/07/23 0830  metroNIDAZOLE  (FLAGYL ) IVPB 500 mg        500 mg 100 mL/hr over 60 Minutes Intravenous Every 12 hours 10/06/23 2116     10/06/23 2030  cefTRIAXone  (ROCEPHIN ) 2 g in  sodium chloride  0.9 % 100 mL IVPB       Placed in "And" Linked Group   2 g 200 mL/hr over 30 Minutes Intravenous  Once 10/06/23 2025 10/06/23 2201   10/06/23 2030  metroNIDAZOLE  (FLAGYL ) IVPB 500 mg       Placed in "And" Linked Group   500 mg 100 mL/hr over 60 Minutes Intravenous  Once 10/06/23 2025 10/06/23 2320        Assessment/Plan 78 y/o F who presents with general malaise, weight loss, and persistent abdominal pain 1 month  after being treated for colitis.   Rectosigmoid inflammation and fluid collection Sigmoid stricture  - IV abx, NPO - GI consult for stricture , will need further evaluation for malignancy versus benign stricture pending clinical course. Not sure if she will be candidate for scope given inflammation - last c scope in February -[facility] [place] - with inability to pass sigmoid colon despite using pediatric scope due to tortuosity and obstruction/extrinsic compression. She was scheduled for CT virtual c scope - Reviewed with MD and think abscess is small and patient may ultimately require resection given severity of obstruction and therefore hold off on IR consult/drainage at this time. If surgical resection done this admission she has high likelihood of needing a colostomy which I discussed with her  Hiatal hernia w/ dysphagia - No indication for urgent surgical intervention at this time. Continue PPI   FEN - NPO VTE - DVT PPX okay from surgery perspective ID - Rocephin /Flagyl   I reviewed hospitalist notes, last 24 h vitals and pain scores, last 48 h intake and output, last 24 h labs and trends, and last 24 h imaging results.   LOS: 1 day   Elwin Hammond, [facility] Surgery 10/07/2023, 10:28 AM Please see Amion for pager number during day hours 7:00am-4:30pm

## 2023-10-07 NOTE — Plan of Care (Signed)

## 2023-10-07 NOTE — Plan of Care (Signed)
  Problem: Education: Goal: Knowledge of General Education information will improve Description: Including pain rating scale, medication(s)/side effects and non-pharmacologic comfort measures Outcome: Progressing   Problem: Activity: Goal: Risk for activity intolerance will decrease Outcome: Progressing   Problem: Nutrition: Goal: Adequate nutrition will be maintained Outcome: Progressing   Problem: Pain Managment: Goal: General experience of comfort will improve and/or be controlled Outcome: Progressing   Problem: Elimination: Goal: Will not experience complications related to bowel motility Outcome: Not Progressing

## 2023-10-07 NOTE — Progress Notes (Signed)
 PROGRESS NOTE  Vanessa Cisneros WUJ:[id number] DOB: 07-18-1945   PCP: [name], MD  Patient is from: Home.  DOA: 10/06/2023 LOS: 1  Chief complaints Chief Complaint  Patient presents with   Abdominal Pain     Brief Narrative / Interim history: 78 year old F with PMH of inflammatory arthritis, CKD-3A, lumbar stenosis, depression and recent hospitalization for acute colitis for which she was treated with antibiotics returning with abdominal pain, decreased appetite, weight loss, nausea and vomiting, and admitted with working diagnosis of acute colitis with diverticulitis with abscess.   In ED, stable vitals.  No leukocytosis.  CT abdomen and pelvis demonstrated rectosigmoid wall thickening with 4.7 x 2.9 x 1.9 cm rim-enhancing lesion concerning for abscess.  General surgery consulted.  Patient was started on IV fluids, CTX and Flagyl  and admitted.   Subjective: Seen and examined earlier this morning.  No major events overnight of this morning.  Continues to endorse abdominal pain, nausea and vomiting..  Pain is diffuse.  Reports 1 episode of emesis.  Not sure if emesis was bloody or not.  Also reports small bump at this morning.  Denies blood in stool.  Objective: Vitals:   10/06/23 2122 10/06/23 2330 10/07/23 0403 10/07/23 0923  BP: 138/86 (!) 112/46 123/69 (!) 131/59  Pulse: 97 85 98 88  Resp: 16 16    Temp: 98.3 F (36.8 C) 98 F (36.7 C) 97.7 F (36.5 C) 97.6 F (36.4 C)  TempSrc: Oral Oral Oral Oral  SpO2: 95% 90% 94% 90%  Weight:      Height:        Examination:  GENERAL: No apparent distress.  Nontoxic. HEENT: MMM.  Vision and hearing grossly intact.  NECK: Supple.  No apparent JVD.  RESP:  No IWOB.  Fair aeration bilaterally. CVS:  RRR. Heart sounds normal.  ABD/GI/GU: BS+. Abd soft.  Diffuse tenderness.  Guarding.  No rebound tenderness. MSK/EXT:  Moves extremities. No apparent deformity. No edema.  SKIN: no apparent skin lesion or wound NEURO:  Awake, alert and oriented appropriately.  No apparent focal neuro deficit. PSYCH: Calm. Normal affect.   Consultants:  General Surgery Interventional radiology  Procedures: None  Microbiology summarized: Blood cultures NGTD  Assessment and plan: Rectosigmoid inflammation with fluid collection: Patient recently treated for colitis.  Stable vitals.  Mild leukocytosis today.  Still with abdominal pain, nausea, vomiting and tenderness. -General Surgery discussed with IR for possible drainage -Continue ceftriaxone  and Flagyl  -Continue IV fluids, analgesics and antiemetics -N.p.o. except sips with meds  Sigmoid stricture -Surgery recommended GI consult to rule out malignancy. May have to wait until infection is addressed.   AKI on CKD-3A: B/l Cr ~1.0.  In the setting of poor p.o. intake and concurrent use of diuretics.  Improving  Recent Labs    08/31/23 2144 09/01/23 0550 09/02/23 0419 09/03/23 0500 10/06/23 1812 10/07/23 0645  BUN 24* 21 16 14  25* 27*  CREATININE 1.23* 0.99 1.05* 1.07* 1.47* 1.33*  -Continue IV fluid -Hold on torsemide. -Avoid nephrotoxic meds  Inflammatory arthritis  -Hold Plaquenil for now    Depression -Resume home Effexor .  Underweight Body mass index is 17.17 kg/m. - Consult dietitian once she starts taking p.o.          DVT prophylaxis:  SCDs Start: 10/06/23 2115  Code Status: Full code Family Communication: None at the bedside Level of care: Med-Surg Status is: Inpatient Remains inpatient appropriate because: Colitis with abscess   Final disposition: Home   55 minutes  with more than 50% spent in reviewing records, counseling patient/family and coordinating care.   Sch Meds:  Scheduled Meds:  pantoprazole   40 mg Oral Daily   sodium chloride  flush  3 mL Intravenous Q12H   Continuous Infusions:  cefTRIAXone  (ROCEPHIN )  IV     lactated ringers  100 mL/hr at 10/07/23 0852   metronidazole  500 mg (10/07/23 0853)   PRN  Meds:.acetaminophen  **OR** acetaminophen , fentaNYL  (SUBLIMAZE ) injection, prochlorperazine   Antimicrobials: Anti-infectives (From admission, onward)    Start     Dose/Rate Route Frequency Ordered Stop   10/07/23 2030  cefTRIAXone  (ROCEPHIN ) 2 g in sodium chloride  0.9 % 100 mL IVPB        2 g 200 mL/hr over 30 Minutes Intravenous Every 24 hours 10/06/23 2116     10/07/23 0830  metroNIDAZOLE  (FLAGYL ) IVPB 500 mg        500 mg 100 mL/hr over 60 Minutes Intravenous Every 12 hours 10/06/23 2116     10/06/23 2030  cefTRIAXone  (ROCEPHIN ) 2 g in sodium chloride  0.9 % 100 mL IVPB       Placed in "And" Linked Group   2 g 200 mL/hr over 30 Minutes Intravenous  Once 10/06/23 2025 10/06/23 2201   10/06/23 2030  metroNIDAZOLE  (FLAGYL ) IVPB 500 mg       Placed in "And" Linked Group   500 mg 100 mL/hr over 60 Minutes Intravenous  Once 10/06/23 2025 10/06/23 2320        I have personally reviewed the following labs and images: CBC: Recent Labs  Lab 10/06/23 1812 10/07/23 0645  WBC 9.6 11.9*  NEUTROABS 8.1*  --   HGB 9.7* 9.0*  HCT 32.0* 28.9*  MCV 93.8 90.9  PLT 411* 395   BMP &GFR Recent Labs  Lab 10/06/23 1812 10/07/23 0645  NA 137 136  K 3.6 3.6  CL 93* 93*  CO2 25 26  GLUCOSE 80 103*  BUN 25* 27*  CREATININE 1.47* 1.33*  CALCIUM  9.6 9.1  MG  --  2.4   Estimated Creatinine Clearance: 20.6 mL/min (A) (by C-G formula based on SCr of 1.33 mg/dL (H)). Liver & Pancreas: Recent Labs  Lab 10/06/23 1812  AST 20  ALT 13  ALKPHOS 49  BILITOT 0.5  PROT 6.4*  ALBUMIN 2.9*   No results for input(s): "LIPASE", "AMYLASE" in the last 168 hours. No results for input(s): "AMMONIA" in the last 168 hours. Diabetic: No results for input(s): "HGBA1C" in the last 72 hours. No results for input(s): "GLUCAP" in the last 168 hours. Cardiac Enzymes: No results for input(s): "CKTOTAL", "CKMB", "CKMBINDEX", "TROPONINI" in the last 168 hours. No results for input(s): "PROBNP" in the last  8760 hours. Coagulation Profile: No results for input(s): "INR", "PROTIME" in the last 168 hours. Thyroid  Function Tests: No results for input(s): "TSH", "T4TOTAL", "FREET4", "T3FREE", "THYROIDAB" in the last 72 hours. Lipid Profile: No results for input(s): "CHOL", "HDL", "LDLCALC", "TRIG", "CHOLHDL", "LDLDIRECT" in the last 72 hours. Anemia Panel: No results for input(s): "VITAMINB12", "FOLATE", "FERRITIN", "TIBC", "IRON", "RETICCTPCT" in the last 72 hours. Urine analysis:    Component Value Date/Time   COLORURINE YELLOW 10/06/2023 1812   APPEARANCEUR CLEAR 10/06/2023 1812   LABSPEC >1.046 (H) 10/06/2023 1812   PHURINE 6.0 10/06/2023 1812   GLUCOSEU NEGATIVE 10/06/2023 1812   HGBUR SMALL (A) 10/06/2023 1812   BILIRUBINUR NEGATIVE 10/06/2023 1812   KETONESUR 5 (A) 10/06/2023 1812   PROTEINUR 30 (A) 10/06/2023 1812   NITRITE NEGATIVE 10/06/2023 1812  LEUKOCYTESUR NEGATIVE 10/06/2023 1812   Sepsis Labs: Invalid input(s): "PROCALCITONIN", "LACTICIDVEN"  Microbiology: Recent Results (from the past 240 hours)  Blood culture (routine x 2)     Status: None (Preliminary result)   Collection Time: 10/06/23  7:07 PM   Specimen: BLOOD RIGHT ARM  Result Value Ref Range Status   Specimen Description BLOOD RIGHT ARM  Final   Special Requests   Final    BOTTLES DRAWN AEROBIC AND ANAEROBIC Blood Culture adequate volume   Culture   Final    NO GROWTH < 12 HOURS Performed at [facility], [address], [place], [place] [address]    Report Status PENDING  Incomplete  Blood culture (routine x 2)     Status: None (Preliminary result)   Collection Time: 10/06/23 10:55 PM   Specimen: BLOOD RIGHT HAND  Result Value Ref Range Status   Specimen Description BLOOD RIGHT HAND  Final   Special Requests   Final    BOTTLES DRAWN AEROBIC AND ANAEROBIC Blood Culture adequate volume   Culture   Final    NO GROWTH < 12 HOURS Performed at [facility], [address] [place],  [place] [address]    Report Status PENDING  Incomplete    Radiology Studies: CT ABDOMEN PELVIS W WO CONTRAST Result Date: 10/06/2023 CLINICAL DATA:  Abdominal pain. Sigmoid colon stricture. Difficulty eating. Tender abdomen. Anemia. EXAM: CT ABDOMEN AND PELVIS WITHOUT AND WITH CONTRAST TECHNIQUE: Multidetector CT imaging of the abdomen and pelvis was performed following the standard protocol before and following the bolus administration of intravenous contrast. RADIATION DOSE REDUCTION: This exam was performed according to the departmental dose-optimization program which includes automated exposure control, adjustment of the mA and/or kV according to patient size and/or use of iterative reconstruction technique. CONTRAST:  75mL OMNIPAQUE  IOHEXOL  300 MG/ML  SOLN COMPARISON:  08/31/2023 FINDINGS: Lower chest: Very large hiatal hernia containing the majority of the stomach. Passive atelectasis in the left lower lobe. Descending thoracic aortic atherosclerosis. Hepatobiliary: Cholecystectomy.  Otherwise unremarkable. Pancreas: Unremarkable Spleen: Unremarkable Adrenals/Urinary Tract: Subcentimeter hypodense lesions in the kidneys are too small to characterize although statistically likely to be benign. No further imaging workup of these lesions is indicated. Adrenal glands unremarkable. Stomach/Bowel: The stomach is mainly intrathoracic and only partially included on today's CT of the abdomen. Prominent wall thickening of the sigmoid colon and rectum circumferentially and diffusely with scattered sigmoid colon diverticula which do not appear significantly inflamed themselves. On image 54 of series 10 we demonstrate a rim enhancing 4.7 by 2.4 by 1.9 cm (volume = 11 cm^3) lesion nestled along the loops of sigmoid colon and rectum not containing internal oral contrast medium and accordingly raising suspicion for abscess. This was not definitively present on 08/31/2023 and accordingly tumor (which could appear identical to  this) is considered less likely. No internal gas within this structure. This sits along the posterior margin of vaginal cuff. The colon proximal to this inflamed rectosigmoid segment is moderately diffusely distended with cecal diameter at 7.6 cm in diameter and a representative segment of the transverse colon and 5.2 cm. However, the small bowel does not appear dilated. Vascular/Lymphatic: Atherosclerosis is present, including aortoiliac atherosclerotic disease. There is substantial narrowing of the proximal celiac trunk on image 49 series 11 due to atheromatous vascular calcification and median arcuate ligament. 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 Neither vessel appears completely occluded. No pathologic adenopathy observed. Reproductive: Uterus absent. Other:  Trace ascites along the inferior right hepatic lobe margin as on image 28 series 7. Musculoskeletal: Mild dextroconvex lumbar scoliosis with rotary component. Lumbar spondylosis and degenerative disc disease with resulting mild to moderate multilevel impingement in the mid to lower lumbar spine. IMPRESSION: 1. Prominent wall thickening of the sigmoid colon and rectum circumferentially and diffusely with scattered sigmoid colon diverticula which do not appear significantly inflamed themselves. This is compatible with colitis/proctitis. 2. There is a rim enhancing 4.7 by 2.4 by 1.9 cm (volume = 11 cc) lesion along the posterior margin of the vaginal cuff, not containing internal oral contrast medium and accordingly raising suspicion for abscess. This was not definitively present on 08/31/2023 and accordingly tumor (which could appear identical to this) is considered less likely. This presumed abscess is in close proximity to the rectosigmoid. 3. The colon proximal to this inflamed rectosigmoid segment is moderately diffusely distended with cecal diameter at 7.6 cm in diameter and a representative  segment of the transverse colon at 5.2 cm. However, the small bowel does not appear dilated. 4. Very large hiatal hernia containing the majority of the stomach. 5. Trace ascites along the inferior right hepatic lobe margin. 6. Substantial narrowing of the proximal celiac trunk due to atheromatous vascular calcification and median arcuate ligament. 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 Aortic Atherosclerosis (ICD10-I70.0). 7. Mild dextroconvex lumbar scoliosis with rotary component. Lumbar spondylosis and degenerative disc disease with resulting mild to moderate multilevel impingement in the mid to lower lumbar spine. Electronically Signed   By: Freida Jes M.D.   On: 10/06/2023 14:36      Creta Dorame T. Meela Wareing [facility]  If 7PM-7AM, please contact night-coverage [email or web address] 10/07/2023, 10:16 AM

## 2023-10-07 NOTE — Progress Notes (Addendum)
 Progress Note     Subjective: Very sleepy this am. Abdominal pain is a little better than when she first came in but still significant. Had episode of emesis last night. No n/v this am but she has been NPO and trying to sleep. Passing some flatus but feels like it is decreasing. Passed multiple small loose bowel movements   Objective: Vital signs in last 24 hours: Temp:  [97.6 F (36.4 C)-98.3 F (36.8 C)] 97.6 F (36.4 C) (05/01 0923) Pulse Rate:  [85-98] 88 (05/01 0923) Resp:  [16-18] 16 (04/30 2330) BP: (112-155)/(42-86) 131/59 (05/01 0923) SpO2:  [90 %-97 %] 90 % (05/01 0923) Weight:  [38.6 kg] 38.6 kg (04/30 1804) Last BM Date : 10/07/23  Intake/Output from previous day: 04/30 0701 - 05/01 0700 In: 820.1 [P.O.:120; I.V.:449.9; IV Piggyback:250.2] Out: 30 [Emesis/NG output:30] Intake/Output this shift: Total I/O In: 50 [P.O.:50] Out: -   PE: General: pleasant, WD, female who is laying in bed in NAD Lungs:  Respiratory effort nonlabored Abd: soft, moderate distension of lower abdomen which is moderately TTP without peritonitis. Well healed infraumbilical midline surgical scar MSK: all 4 extremities are symmetrical with no cyanosis, clubbing, or edema. Skin: warm and dry Psych: A&Ox3 with an appropriate affect.    Lab Results:  Recent Labs    10/06/23 1812 10/07/23 0645  WBC 9.6 11.9*  HGB 9.7* 9.0*  HCT 32.0* 28.9*  PLT 411* 395   BMET Recent Labs    10/06/23 1812 10/07/23 0645  NA 137 136  K 3.6 3.6  CL 93* 93*  CO2 25 26  GLUCOSE 80 103*  BUN 25* 27*  CREATININE 1.47* 1.33*  CALCIUM  9.6 9.1   PT/INR No results for input(s): "LABPROT", "INR" in the last 72 hours. CMP     Component Value Date/Time   NA 136 10/07/2023 0645   K 3.6 10/07/2023 0645   CL 93 (L) 10/07/2023 0645   CO2 26 10/07/2023 0645   GLUCOSE 103 (H) 10/07/2023 0645   BUN 27 (H) 10/07/2023 0645   CREATININE 1.33 (H) 10/07/2023 0645   CALCIUM  9.1 10/07/2023 0645   PROT  6.4 (L) 10/06/2023 1812   ALBUMIN 2.9 (L) 10/06/2023 1812   AST 20 10/06/2023 1812   ALT 13 10/06/2023 1812   ALKPHOS 49 10/06/2023 1812   BILITOT 0.5 10/06/2023 1812   GFRNONAA 40 (L) 10/07/2023 0645   GFRAA 59 (L) 09/30/2015 1105   Lipase     Component Value Date/Time   LIPASE 40 08/31/2023 2144       Studies/Results: CT ABDOMEN PELVIS W WO CONTRAST Result Date: 10/06/2023 CLINICAL DATA:  Abdominal pain. Sigmoid colon stricture. Difficulty eating. Tender abdomen. Anemia. EXAM: CT ABDOMEN AND PELVIS WITHOUT AND WITH CONTRAST TECHNIQUE: Multidetector CT imaging of the abdomen and pelvis was performed following the standard protocol before and following the bolus administration of intravenous contrast. RADIATION DOSE REDUCTION: This exam was performed according to the departmental dose-optimization program which includes automated exposure control, adjustment of the mA and/or kV according to patient size and/or use of iterative reconstruction technique. CONTRAST:  75mL OMNIPAQUE  IOHEXOL  300 MG/ML  SOLN COMPARISON:  08/31/2023 FINDINGS: Lower chest: Very large hiatal hernia containing the majority of the stomach. Passive atelectasis in the left lower lobe. Descending thoracic aortic atherosclerosis. Hepatobiliary: Cholecystectomy.  Otherwise unremarkable. Pancreas: Unremarkable Spleen: Unremarkable Adrenals/Urinary Tract: Subcentimeter hypodense lesions in the kidneys are too small to characterize although statistically likely to be benign. No further imaging workup of these lesions  is indicated. Adrenal glands unremarkable. Stomach/Bowel: The stomach is mainly intrathoracic and only partially included on today's CT of the abdomen. Prominent wall thickening of the sigmoid colon and rectum circumferentially and diffusely with scattered sigmoid colon diverticula which do not appear significantly inflamed themselves. On image 54 of series 10 we demonstrate a rim enhancing 4.7 by 2.4 by 1.9 cm (volume  = 11 cm^3) lesion nestled along the loops of sigmoid colon and rectum not containing internal oral contrast medium and accordingly raising suspicion for abscess. This was not definitively present on 08/31/2023 and accordingly tumor (which could appear identical to this) is considered less likely. No internal gas within this structure. This sits along the posterior margin of vaginal cuff. The colon proximal to this inflamed rectosigmoid segment is moderately diffusely distended with cecal diameter at 7.6 cm in diameter and a representative segment of the transverse colon and 5.2 cm. However, the small bowel does not appear dilated. Vascular/Lymphatic: Atherosclerosis is present, including aortoiliac atherosclerotic disease. There is substantial narrowing of the proximal celiac trunk on image 49 series 11 due to atheromatous vascular calcification and median arcuate ligament. 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 Neither vessel appears completely occluded. No pathologic adenopathy observed. Reproductive: Uterus absent. Other: Trace ascites along the inferior right hepatic lobe margin as on image 28 series 7. Musculoskeletal: Mild dextroconvex lumbar scoliosis with rotary component. Lumbar spondylosis and degenerative disc disease with resulting mild to moderate multilevel impingement in the mid to lower lumbar spine. IMPRESSION: 1. Prominent wall thickening of the sigmoid colon and rectum circumferentially and diffusely with scattered sigmoid colon diverticula which do not appear significantly inflamed themselves. This is compatible with colitis/proctitis. 2. There is a rim enhancing 4.7 by 2.4 by 1.9 cm (volume = 11 cc) lesion along the posterior margin of the vaginal cuff, not containing internal oral contrast medium and accordingly raising suspicion for abscess. This was not definitively present on 08/31/2023 and accordingly tumor (which could appear  identical to this) is considered less likely. This presumed abscess is in close proximity to the rectosigmoid. 3. The colon proximal to this inflamed rectosigmoid segment is moderately diffusely distended with cecal diameter at 7.6 cm in diameter and a representative segment of the transverse colon at 5.2 cm. However, the small bowel does not appear dilated. 4. Very large hiatal hernia containing the majority of the stomach. 5. Trace ascites along the inferior right hepatic lobe margin. 6. Substantial narrowing of the proximal celiac trunk due to atheromatous vascular calcification and median arcuate ligament. 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 Aortic Atherosclerosis (ICD10-I70.0). 7. Mild dextroconvex lumbar scoliosis with rotary component. Lumbar spondylosis and degenerative disc disease with resulting mild to moderate multilevel impingement in the mid to lower lumbar spine. Electronically Signed   By: Freida Jes M.D.   On: 10/06/2023 14:36    Anti-infectives: Anti-infectives (From admission, onward)    Start     Dose/Rate Route Frequency Ordered Stop   10/07/23 2030  cefTRIAXone  (ROCEPHIN ) 2 g in sodium chloride  0.9 % 100 mL IVPB        2 g 200 mL/hr over 30 Minutes Intravenous Every 24 hours 10/06/23 2116     10/07/23 0830  metroNIDAZOLE  (FLAGYL ) IVPB 500 mg        500 mg 100 mL/hr over 60 Minutes Intravenous Every 12 hours 10/06/23 2116     10/06/23 2030  cefTRIAXone  (ROCEPHIN ) 2 g in  sodium chloride  0.9 % 100 mL IVPB       Placed in "And" Linked Group   2 g 200 mL/hr over 30 Minutes Intravenous  Once 10/06/23 2025 10/06/23 2201   10/06/23 2030  metroNIDAZOLE  (FLAGYL ) IVPB 500 mg       Placed in "And" Linked Group   500 mg 100 mL/hr over 60 Minutes Intravenous  Once 10/06/23 2025 10/06/23 2320        Assessment/Plan 78 y/o F who presents with general malaise, weight loss, and persistent abdominal pain 1 month  after being treated for colitis.   Rectosigmoid inflammation and fluid collection Sigmoid stricture  - IV abx, NPO - GI consult for stricture , will need further evaluation for malignancy versus benign stricture pending clinical course. Not sure if she will be candidate for scope given inflammation - last c scope in February [place] [place] - with inability to pass sigmoid colon despite using pediatric scope due to tortuosity and obstruction/extrinsic compression. She was scheduled for CT virtual c scope - Reviewed with MD and think abscess is small and patient may ultimately require resection given severity of obstruction and therefore hold off on IR consult/drainage at this time. If surgical resection done this admission she has high likelihood of needing a colostomy which I discussed with her  Hiatal hernia w/ dysphagia - No indication for urgent surgical intervention at this time. Continue PPI   FEN - NPO VTE - DVT PPX okay from surgery perspective ID - Rocephin /Flagyl   I reviewed hospitalist notes, last 24 h vitals and pain scores, last 48 h intake and output, last 24 h labs and trends, and last 24 h imaging results.   LOS: 1 day   Elwin Hammond, [facility] Surgery 10/07/2023, 10:28 AM Please see Amion for pager number during day hours 7:00am-4:30pm

## 2023-10-08 ENCOUNTER — Other Ambulatory Visit: Payer: Self-pay

## 2023-10-08 ENCOUNTER — Encounter (HOSPITAL_COMMUNITY)
Admission: EM | Disposition: A | Discharge status: Home Health | Payer: Self-pay | Source: Home / Self Care | Attending: Student

## 2023-10-08 ENCOUNTER — Encounter (HOSPITAL_COMMUNITY): Payer: Self-pay | Admitting: Family Medicine

## 2023-10-08 ENCOUNTER — Inpatient Hospital Stay (HOSPITAL_COMMUNITY): Admitting: Anesthesiology

## 2023-10-08 DIAGNOSIS — E785 Hyperlipidemia, unspecified: Secondary | ICD-10-CM

## 2023-10-08 DIAGNOSIS — F32A Depression, unspecified: Secondary | ICD-10-CM | POA: Diagnosis not present

## 2023-10-08 DIAGNOSIS — N1831 Chronic kidney disease, stage 3a: Secondary | ICD-10-CM

## 2023-10-08 DIAGNOSIS — M064 Inflammatory polyarthropathy: Secondary | ICD-10-CM | POA: Diagnosis not present

## 2023-10-08 DIAGNOSIS — K529 Noninfective gastroenteritis and colitis, unspecified: Secondary | ICD-10-CM | POA: Diagnosis not present

## 2023-10-08 DIAGNOSIS — N179 Acute kidney failure, unspecified: Secondary | ICD-10-CM | POA: Diagnosis not present

## 2023-10-08 DIAGNOSIS — K56609 Unspecified intestinal obstruction, unspecified as to partial versus complete obstruction: Secondary | ICD-10-CM | POA: Diagnosis not present

## 2023-10-08 HISTORY — PX: LAPAROTOMY: SHX154

## 2023-10-08 HISTORY — PX: COLOSTOMY: SHX63

## 2023-10-08 HISTORY — PX: COLON RESECTION SIGMOID: SHX6737

## 2023-10-08 LAB — SURGICAL PCR SCREEN
MRSA, PCR: NEGATIVE
Staphylococcus aureus: NEGATIVE

## 2023-10-08 LAB — RETICULOCYTES
Immature Retic Fract: 25.9 % — ABNORMAL HIGH (ref 2.3–15.9)
RBC.: 2.84 MIL/uL — ABNORMAL LOW (ref 3.87–5.11)
Retic Count, Absolute: 75 10*3/uL (ref 19.0–186.0)
Retic Ct Pct: 2.6 % (ref 0.4–3.1)

## 2023-10-08 LAB — BASIC METABOLIC PANEL WITH GFR
Anion gap: 17 — ABNORMAL HIGH (ref 5–15)
BUN: 18 mg/dL (ref 8–23)
CO2: 20 mmol/L — ABNORMAL LOW (ref 22–32)
Calcium: 8.4 mg/dL — ABNORMAL LOW (ref 8.9–10.3)
Chloride: 101 mmol/L (ref 98–111)
Creatinine, Ser: 1.11 mg/dL — ABNORMAL HIGH (ref 0.44–1.00)
GFR, Estimated: 50 mL/min — ABNORMAL LOW (ref >60)
Glucose, Bld: 89 mg/dL (ref 70–99)
Potassium: 3.5 mmol/L (ref 3.5–5.1)
Sodium: 138 mmol/L (ref 135–145)

## 2023-10-08 LAB — POCT I-STAT 7, (LYTES, BLD GAS, ICA,H+H)
Acid-base deficit: 2 mmol/L (ref 0.0–2.0)
Bicarbonate: 23.7 mmol/L (ref 20.0–28.0)
Calcium, Ion: 1.15 mmol/L (ref 1.15–1.40)
HCT: 21 % — ABNORMAL LOW (ref 36.0–46.0)
Hemoglobin: 7.1 g/dL — ABNORMAL LOW (ref 12.0–15.0)
O2 Saturation: 99 %
Patient temperature: 36.6
Potassium: 3 mmol/L — ABNORMAL LOW (ref 3.5–5.1)
Sodium: 136 mmol/L (ref 135–145)
TCO2: 25 mmol/L (ref 22–32)
pCO2 arterial: 43.7 mmHg (ref 32–48)
pH, Arterial: 7.34 — ABNORMAL LOW (ref 7.35–7.45)
pO2, Arterial: 162 mmHg — ABNORMAL HIGH (ref 83–108)

## 2023-10-08 LAB — CBC
HCT: 25.8 % — ABNORMAL LOW (ref 36.0–46.0)
Hemoglobin: 7.9 g/dL — ABNORMAL LOW (ref 12.0–15.0)
MCH: 28.3 pg (ref 26.0–34.0)
MCHC: 30.6 g/dL (ref 30.0–36.0)
MCV: 92.5 fL (ref 80.0–100.0)
Platelets: 369 10*3/uL (ref 150–400)
RBC: 2.79 MIL/uL — ABNORMAL LOW (ref 3.87–5.11)
RDW: 15.9 % — ABNORMAL HIGH (ref 11.5–15.5)
WBC: 11.9 10*3/uL — ABNORMAL HIGH (ref 4.0–10.5)
nRBC: 0 % (ref 0.0–0.2)

## 2023-10-08 LAB — FOLATE: Folate: 16.1 ng/mL (ref >5.9)

## 2023-10-08 LAB — MAGNESIUM: Magnesium: 1.8 mg/dL (ref 1.7–2.4)

## 2023-10-08 LAB — IRON AND TIBC
Iron: 7 ug/dL — ABNORMAL LOW (ref 28–170)
Saturation Ratios: 5 % — ABNORMAL LOW (ref 10.4–31.8)
TIBC: 139 ug/dL — ABNORMAL LOW (ref 250–450)
UIBC: 132 ug/dL

## 2023-10-08 LAB — VITAMIN B12: Vitamin B-12: 3642 pg/mL — ABNORMAL HIGH (ref 180–914)

## 2023-10-08 LAB — TYPE AND SCREEN
ABO/RH(D): A POS
Antibody Screen: NEGATIVE

## 2023-10-08 LAB — FERRITIN: Ferritin: 339 ng/mL — ABNORMAL HIGH (ref 11–307)

## 2023-10-08 LAB — POCT ACTIVATED CLOTTING TIME: Activated Clotting Time: 0 s

## 2023-10-08 SURGERY — LAPAROTOMY, EXPLORATORY
Anesthesia: General

## 2023-10-08 MED ORDER — 0.9 % SODIUM CHLORIDE (POUR BTL) OPTIME
TOPICAL | Status: DC | PRN
Start: 2023-10-08 — End: 2023-10-08
  Administered 2023-10-08: 1000 mL
  Administered 2023-10-08: 2000 mL

## 2023-10-08 MED ORDER — FENTANYL CITRATE (PF) 100 MCG/2ML IJ SOLN: 25.0000 ug | INTRAMUSCULAR | Status: DC | PRN

## 2023-10-08 MED ORDER — DEXAMETHASONE SODIUM PHOSPHATE 10 MG/ML IJ SOLN
INTRAMUSCULAR | Status: DC | PRN
  Administered 2023-10-08: 4 mg via INTRAVENOUS

## 2023-10-08 MED ORDER — LACTATED RINGERS IV SOLN: INTRAVENOUS | Status: AC

## 2023-10-08 MED ORDER — LACTATED RINGERS IV SOLN: INTRAVENOUS | Status: DC

## 2023-10-08 MED ORDER — NOREPINEPHRINE 4 MG/250ML-% IV SOLN
INTRAVENOUS | Status: AC
  Filled 2023-10-08: qty 250

## 2023-10-08 MED ORDER — GLYCOPYRROLATE 0.2 MG/ML IJ SOLN
INTRAMUSCULAR | Status: DC | PRN
  Administered 2023-10-08: .15 mg via INTRAVENOUS

## 2023-10-08 MED ORDER — ORAL CARE MOUTH RINSE: 15.0000 mL | Freq: Once | OROMUCOSAL | Status: AC

## 2023-10-08 MED ORDER — MUPIROCIN 2 % EX OINT
TOPICAL_OINTMENT | Freq: Two times a day (BID) | CUTANEOUS | Status: DC
  Administered 2023-10-09 – 2023-10-10 (×2): 1 via NASAL
  Filled 2023-10-08 (×2): qty 22

## 2023-10-08 MED ORDER — FENTANYL CITRATE (PF) 250 MCG/5ML IJ SOLN
INTRAMUSCULAR | Status: DC | PRN
Start: 2023-10-08 — End: 2023-10-08
  Administered 2023-10-08 (×2): 25 ug via INTRAVENOUS
  Administered 2023-10-08: 100 ug via INTRAVENOUS
  Administered 2023-10-08: 50 ug via INTRAVENOUS

## 2023-10-08 MED ORDER — LACTATED RINGERS IV SOLN
INTRAVENOUS | Status: DC | PRN
Start: 2023-10-08 — End: 2023-10-08

## 2023-10-08 MED ORDER — CHLORHEXIDINE GLUCONATE 0.12 % MT SOLN: 15.0000 mL | Freq: Once | OROMUCOSAL | Status: AC

## 2023-10-08 MED ORDER — BOOST / RESOURCE BREEZE PO LIQD CUSTOM
1.0000 | Freq: Three times a day (TID) | ORAL | Status: DC
  Administered 2023-10-08 – 2023-10-10 (×7): 1 via ORAL

## 2023-10-08 MED ORDER — CHLORHEXIDINE GLUCONATE 0.12 % MT SOLN
OROMUCOSAL | Status: AC
  Administered 2023-10-08: 15 mL via OROMUCOSAL
  Filled 2023-10-08: qty 15

## 2023-10-08 MED ORDER — MIDAZOLAM HCL 2 MG/2ML IJ SOLN
INTRAMUSCULAR | Status: AC
  Filled 2023-10-08: qty 2

## 2023-10-08 MED ORDER — ROCURONIUM BROMIDE 10 MG/ML (PF) SYRINGE
PREFILLED_SYRINGE | INTRAVENOUS | Status: DC | PRN
  Administered 2023-10-08: 40 mg via INTRAVENOUS

## 2023-10-08 MED ORDER — AMISULPRIDE (ANTIEMETIC) 5 MG/2ML IV SOLN: 10.0000 mg | Freq: Once | INTRAVENOUS | Status: DC | PRN

## 2023-10-08 MED ORDER — ACETAMINOPHEN 10 MG/ML IV SOLN: 600.0000 mg | Freq: Once | INTRAVENOUS | Status: DC | PRN

## 2023-10-08 MED ORDER — LIDOCAINE 2% (20 MG/ML) 5 ML SYRINGE
INTRAMUSCULAR | Status: DC | PRN
  Administered 2023-10-08: 40 mg via INTRAVENOUS

## 2023-10-08 MED ORDER — PROPOFOL 10 MG/ML IV BOLUS
INTRAVENOUS | Status: AC
  Filled 2023-10-08: qty 20

## 2023-10-08 MED ORDER — OXYCODONE HCL 5 MG PO TABS
5.0000 mg | ORAL_TABLET | ORAL | Status: DC | PRN
  Administered 2023-10-08 (×2): 5 mg via ORAL
  Filled 2023-10-08 (×3): qty 1

## 2023-10-08 MED ORDER — SUGAMMADEX SODIUM 200 MG/2ML IV SOLN
INTRAVENOUS | Status: DC | PRN
  Administered 2023-10-08 (×2): 50 mg via INTRAVENOUS

## 2023-10-08 MED ORDER — PHENYLEPHRINE HCL-NACL 20-0.9 MG/250ML-% IV SOLN
INTRAVENOUS | Status: AC
  Filled 2023-10-08: qty 250

## 2023-10-08 MED ORDER — FENTANYL CITRATE (PF) 250 MCG/5ML IJ SOLN
INTRAMUSCULAR | Status: AC
  Filled 2023-10-08: qty 5

## 2023-10-08 MED ORDER — BACITRACIN ZINC 500 UNIT/GM EX OINT: TOPICAL_OINTMENT | Freq: Two times a day (BID) | CUTANEOUS | Status: DC

## 2023-10-08 MED ORDER — PHENYLEPHRINE HCL-NACL 20-0.9 MG/250ML-% IV SOLN
INTRAVENOUS | Status: DC | PRN
  Administered 2023-10-08: 25 ug/min via INTRAVENOUS

## 2023-10-08 MED ORDER — METHOCARBAMOL 1000 MG/10ML IJ SOLN
500.0000 mg | Freq: Four times a day (QID) | INTRAMUSCULAR | Status: DC | PRN
  Administered 2023-10-08: 500 mg via INTRAVENOUS
  Filled 2023-10-08: qty 10

## 2023-10-08 MED ORDER — ONDANSETRON HCL 4 MG/2ML IJ SOLN: 4.0000 mg | Freq: Once | INTRAMUSCULAR | Status: DC | PRN

## 2023-10-08 MED ORDER — ALBUMIN HUMAN 5 % IV SOLN: INTRAVENOUS | Status: DC | PRN

## 2023-10-08 MED ORDER — PROPOFOL 10 MG/ML IV BOLUS
INTRAVENOUS | Status: DC | PRN
  Administered 2023-10-08: 10 mg via INTRAVENOUS
  Administered 2023-10-08: 40 mg via INTRAVENOUS
  Administered 2023-10-08 (×2): 10 mg via INTRAVENOUS
  Administered 2023-10-08: 20 mg via INTRAVENOUS
  Administered 2023-10-08: 40 mg via INTRAVENOUS

## 2023-10-08 MED ORDER — ONDANSETRON HCL 4 MG/2ML IJ SOLN
INTRAMUSCULAR | Status: DC | PRN
  Administered 2023-10-08: 4 mg via INTRAVENOUS

## 2023-10-08 SURGICAL SUPPLY — 44 items
BAG COUNTER SPONGE SURGICOUNT (BAG) ×1 IMPLANT
BLADE CLIPPER SURG (BLADE) IMPLANT
CANISTER SUCT 3000ML PPV (MISCELLANEOUS) ×1 IMPLANT
CHLORAPREP W/TINT 26 (MISCELLANEOUS) ×1 IMPLANT
COVER SURGICAL LIGHT HANDLE (MISCELLANEOUS) ×1 IMPLANT
DRAPE LAPAROSCOPIC ABDOMINAL (DRAPES) ×1 IMPLANT
DRAPE WARM FLUID 44X44 (DRAPES) ×1 IMPLANT
DRSG OPSITE POSTOP 4X10 (GAUZE/BANDAGES/DRESSINGS) IMPLANT
DRSG OPSITE POSTOP 4X8 (GAUZE/BANDAGES/DRESSINGS) IMPLANT
ELECT BLADE 6.5 EXT (BLADE) IMPLANT
ELECT CAUTERY BLADE 6.4 (BLADE) ×1 IMPLANT
ELECTRODE BLDE 4.0 EZ CLN MEGD (MISCELLANEOUS) IMPLANT
ELECTRODE REM PT RTRN 9FT ADLT (ELECTROSURGICAL) ×1 IMPLANT
GLOVE BIO SURGEON STRL SZ 6 (GLOVE) ×1 IMPLANT
GLOVE INDICATOR 6.5 STRL GRN (GLOVE) ×1 IMPLANT
GOWN STRL REUS W/ TWL LRG LVL3 (GOWN DISPOSABLE) ×2 IMPLANT
HANDLE SUCTION POOLE (INSTRUMENTS) ×1 IMPLANT
KIT BASIN OR (CUSTOM PROCEDURE TRAY) ×1 IMPLANT
KIT OSTOMY DRAINABLE 2.75 STR (WOUND CARE) IMPLANT
KIT TURNOVER KIT B (KITS) ×1 IMPLANT
LIGASURE IMPACT 36 18CM CVD LR (INSTRUMENTS) IMPLANT
NS IRRIG 1000ML POUR BTL (IV SOLUTION) ×2 IMPLANT
PACK GENERAL/GYN (CUSTOM PROCEDURE TRAY) ×1 IMPLANT
PAD ARMBOARD POSITIONER FOAM (MISCELLANEOUS) ×1 IMPLANT
PENCIL SMOKE EVACUATOR (MISCELLANEOUS) ×1 IMPLANT
RELOAD GRN CONTOUR (ENDOMECHANICALS) ×1 IMPLANT
RELOAD STAPLE 40 GRN THCK (ENDOMECHANICALS) IMPLANT
RETRACTOR WND ALEXIS 25 LRG (MISCELLANEOUS) IMPLANT
RETRACTOR WOUND ALXS 25CM LRG (MISCELLANEOUS) ×1 IMPLANT
SPECIMEN JAR LARGE (MISCELLANEOUS) IMPLANT
SPONGE T-LAP 18X18 ~~LOC~~+RFID (SPONGE) IMPLANT
STAPLER CVD CUT GN 40 RELOAD (ENDOMECHANICALS) ×1 IMPLANT
STAPLER CVD CUT GRN 40 RELOAD (ENDOMECHANICALS) IMPLANT
STAPLER SKIN PROX WIDE 3.9 (STAPLE) IMPLANT
STAPLER VISISTAT 35W (STAPLE) ×1 IMPLANT
SUT PDS AB 1 TP1 96 (SUTURE) ×2 IMPLANT
SUT PROLENE 2 0 CT2 30 (SUTURE) IMPLANT
SUT SILK 2 0 SH CR/8 (SUTURE) ×1 IMPLANT
SUT SILK 2 0 TIES 10X30 (SUTURE) ×1 IMPLANT
SUT SILK 3 0 SH CR/8 (SUTURE) ×1 IMPLANT
SUT SILK 3 0 TIES 10X30 (SUTURE) ×1 IMPLANT
SUT VIC AB 3-0 SH 18 (SUTURE) IMPLANT
TOWEL GREEN STERILE (TOWEL DISPOSABLE) ×1 IMPLANT
TRAY FOLEY MTR SLVR 16FR STAT (SET/KITS/TRAYS/PACK) IMPLANT

## 2023-10-08 NOTE — Anesthesia Preprocedure Evaluation (Addendum)
 Anesthesia Evaluation  Patient identified by MRN, date of birth, ID band Patient awake    Reviewed: Allergy & Precautions, NPO status , Patient's Chart, lab work & pertinent test results  Airway Mallampati: II  TM Distance: >3 FB Neck ROM: Full    Dental no notable dental hx.    Pulmonary neg pulmonary ROS   Pulmonary exam normal        Cardiovascular +CHF  Normal cardiovascular exam     Neuro/Psych  PSYCHIATRIC DISORDERS  Depression    negative neurological ROS     GI/Hepatic Neg liver ROS,GERD  Medicated and Controlled,,  Endo/Other  negative endocrine ROS    Renal/GU Renal InsufficiencyRenal disease     Musculoskeletal  (+) Arthritis ,    Abdominal   Peds  Hematology  (+) Blood dyscrasia, anemia   Anesthesia Other Findings Rectosigmoid Inflammation Sigmoid Stricture  Reproductive/Obstetrics                              Anesthesia Physical Anesthesia Plan  ASA: 3  Anesthesia Plan: General   Post-op Pain Management:    Induction: Intravenous  PONV Risk Score and Plan: 3 and Ondansetron , Dexamethasone and Treatment may vary due to age or medical condition  Airway Management Planned: Oral ETT  Additional Equipment: Arterial line  Intra-op Plan:   Post-operative Plan: Extubation in OR  Informed Consent: I have reviewed the patients History and Physical, chart, labs and discussed the procedure including the risks, benefits and alternatives for the proposed anesthesia with the patient or authorized representative who has indicated his/her understanding and acceptance.     Dental advisory given  Plan Discussed with: CRNA  Anesthesia Plan Comments: (Potential central line placement discussed  Anesthesia plan discussed with patient and grandson)         Anesthesia Quick Evaluation

## 2023-10-08 NOTE — Interval H&P Note (Signed)
 Surgery again reviewed with patient and questions answered. No change from yesterday. Wi plan to proceed with partial colectomy and end colostomy for sigmoid diverticulitis with abscess and chronic worsening partial obstruction.

## 2023-10-08 NOTE — Progress Notes (Signed)
 Patient arrived to [phone] with PACU. Denies any pain 0/10. Alert and oriented at baseline. Emptied 300cc out of Ostomy. Foley catheter contains . Call light within reach, bed in lowest position, and bed alarm set.

## 2023-10-08 NOTE — Anesthesia Procedure Notes (Signed)
 Procedure Name: Intubation Date/Time: 10/08/2023 7:56 AM  Performed by: Inez Manger, CRNAPre-anesthesia Checklist: Patient identified, Emergency Drugs available, Suction available, Patient being monitored and Timeout performed Patient Re-evaluated:Patient Re-evaluated prior to induction Oxygen Delivery Method: Circle system utilized Preoxygenation: Pre-oxygenation with 100% oxygen Induction Type: IV induction Ventilation: Mask ventilation without difficulty Laryngoscope Size: Miller and 2 Grade View: Grade I Tube type: Oral Tube size: 6.5 mm Number of attempts: 1 Airway Equipment and Method: Stylet Placement Confirmation: ETT inserted through vocal cords under direct vision, positive ETCO2 and breath sounds checked- equal and bilateral Secured at: 19 cm Tube secured with: Tape Dental Injury: Teeth and Oropharynx as per pre-operative assessment  Comments: Smooth brief atraumatic dentition unchanged

## 2023-10-08 NOTE — Consult Note (Signed)
 WOC consulted for new end colostomy; to OR today 5/2; will place on FU list for Monday visit for ostomy assessment and post op ostomy teaching.   Vanessa Cisneros [facility], CNS, [employer] [phone]

## 2023-10-08 NOTE — Anesthesia Procedure Notes (Signed)
 Arterial Line Insertion Start/End5/07/2023 7:50 AM, 10/08/2023 7:55 AM Performed by: Peggi Bowels, MD, anesthesiologist  Patient location: OR. Preanesthetic checklist: patient identified, IV checked, site marked, risks and benefits discussed, surgical consent, monitors and equipment checked, pre-op evaluation, timeout performed and anesthesia consent Lidocaine  1% used for infiltration Left, radial was placed Catheter size: 20 G Hand hygiene performed , maximum sterile barriers used  and Seldinger technique used  Attempts: 1 Procedure performed using ultrasound guided technique. Ultrasound Notes:anatomy identified, needle tip was noted to be adjacent to the nerve/plexus identified and no ultrasound evidence of intravascular and/or intraneural injection Following insertion, dressing applied and Biopatch. Post procedure assessment: normal and unchanged  Patient tolerated the procedure well with no immediate complications.

## 2023-10-08 NOTE — Plan of Care (Signed)
   Problem: Coping: Goal: Level of anxiety will decrease Outcome: Progressing   Problem: Pain Managment: Goal: General experience of comfort will improve and/or be controlled Outcome: Progressing   Problem: Safety: Goal: Ability to remain free from injury will improve Outcome: Progressing

## 2023-10-08 NOTE — Transfer of Care (Signed)
 Immediate Anesthesia Transfer of Care Note  Patient: Vanessa Cisneros  Procedure(s) Performed: LAPAROTOMY, EXPLORATORY COLECTOMY, SIGMOID, OPEN CREATION, COLOSTOMY  Patient Location: PACU  Anesthesia Type:General  Level of Consciousness: drowsy  Airway & Oxygen Therapy: Patient Spontanous Breathing and Patient connected to nasal cannula oxygen  Post-op Assessment: Report given to RN and Post -op Vital signs reviewed and stable  Post vital signs: Reviewed and stable  Last Vitals:  Vitals Value Taken Time  BP 138/65 10/08/23 1030  Temp 36.6 C 10/08/23 1030  Pulse 89 10/08/23 1034  Resp 15 10/08/23 1034  SpO2 92 % 10/08/23 1034  Vitals shown include unfiled device data.  Last Pain:  Vitals:   10/08/23 1030  TempSrc:   PainSc: Asleep      Patients Stated Pain Goal: 2 (10/06/23 2200)  Complications: No notable events documented.

## 2023-10-08 NOTE — Op Note (Signed)
 Operative Note  Vanessa Cisneros  [id number]  [id number]  10/08/2023   Surgeon: Aldon Hung MD FACS    Procedure performed: Exploratory laparotomy, partial sigmoid colectomy with end colostomy   Preop diagnosis: Diverticulitis with abscess and obstruction Post-op diagnosis/intraop findings: Same   Specimens: Distal sigmoid, stitch marks distal margin Retained items: no  EBL: 50cc Complications: none   Description of procedure: After obtaining informed consent the patient was taken to the operating room and placed supine on operating room table where general endotracheal anesthesia was initiated, preoperative antibiotics were administered, SCDs applied, and a formal timeout was performed.  Foley catheter was inserted under sterile conditions.  The abdomen was prepped and draped in usual sterile fashion and a midline laparotomy was created.  An Alexis wound protector was placed after lysing omental adhesions to the anterior abdominal wall from her prior midline laparotomy.  The small bowel and ascending/transverse colon appeared dilated along with the proximal descending and sigmoid.  The obstructing process was noted deep in the pelvis where the distal sigmoid and rectum were essentially curled around a dense inflammatory process.  The Bookwalter retractor was placed for additional retraction to aid in visualization.  Lateral attachments of the sigmoid colon were able to be divided followed distally.  Finger fracture was necessary to liberate the distal sigmoid and proximal rectum from deep in the pelvis and in doing so the abscess cavity was entered and this was drained completely.  After a bit more blunt dissection, we were able to identify a soft area on the rectum distal to the obstructive process.  A small mesenteric window was made with blunt dissection and then a contour green load stapler was used to transect the rectum in an area that was soft and healthy, just distal to the  inflammatory process.  A 2-0 Prolene was placed on the staple line for future identification.  We then divided the distal sigmoid with another fire of the contour stapler and the intervening mesentery was divided along the colon wall with the LigaSure device.  Additional hemostasis in the mesentery was achieved with 2-0 silk sutures.  The specimen was marked as above and handed off to pathology.  The abdomen was irrigated with warm sterile saline; the effluent was clear.  Hemostasis was confirmed and no additional abscess cavities were palpable.  The bowel was run from the ileocecal valve proximally.  She does have some apparent scarring and tethering of the terminal ileum about 10 cm from the ileocecal valve, but this is palpably patent and bowel contents were easily milked through so this was left alone.  The bowel was all returned to the abdominal cavity in normal anatomical position and the omentum brought down over this.  We then created our colostomy site in the left mid abdomen and the stapled end of the descending/sigmoid colon was brought out through this ensuring no twist in the mesentery.  The fascia was closed with looped #1 PDS starting at either end and tying centrally.  Hemostasis was ensured within the wound and this was then closed with staples.  The staple line along the colostomy was then excised and the colostomy matured in the standard brooked fashion.  This was digitized and confirmed to be patent and appeared well-perfused.  Sterile dressings and an ostomy appliance were then placed.  The patient was then awakened, extubated and taken to PACU in stable condition.    All counts were correct at the completion of the case.

## 2023-10-08 NOTE — Progress Notes (Signed)
 PROGRESS NOTE  Vanessa Cisneros JYN:[id number] DOB: 02/04/1946   PCP: [name], MD  Patient is from: Home.  DOA: 10/06/2023 LOS: 2  Chief complaints Chief Complaint  Patient presents with   Abdominal Pain     Brief Narrative / Interim history: 78 year old F with PMH of inflammatory arthritis, CKD-3A, lumbar stenosis, depression and recent hospitalization for acute colitis for which she was treated with antibiotics returning with abdominal pain, decreased appetite, weight loss, nausea and vomiting, and admitted with working diagnosis of acute colitis with diverticulitis with abscess.   In ED, stable vitals.  No leukocytosis.  CT abdomen and pelvis demonstrated rectosigmoid wall thickening with 4.7 x 2.9 x 1.9 cm rim-enhancing lesion concerning for abscess.  General surgery consulted.  Patient was started on IV fluids, CTX and Flagyl  and admitted.  Patient underwent ex lap with partial sigmoid colectomy and end colostomy by Dr. Lanell Pinta on 5/2.   Subjective: Seen and examined earlier this afternoon after she returned from surgery.  Patient's sister and brother-in-law at the bedside.  Reports some abdominal pain.  No other complaints.  She is awake but not quite alert.  Objective: Vitals:   10/08/23 1030 10/08/23 1045 10/08/23 1100 10/08/23 1138  BP: 138/65 (!) 138/57 (!) 132/52 (!) 128/53  Pulse: 93 88 81 85  Resp: 16 16 15 14   Temp: 97.8 F (36.6 C)  97.8 F (36.6 C) 98.4 F (36.9 C)  TempSrc:    Oral  SpO2: 94% 91% 96% 95%  Weight:      Height:        Examination:  GENERAL: No apparent distress.  Nontoxic. HEENT: MMM.  Vision and hearing grossly intact.  NECK: Supple.  No apparent JVD.  RESP:  No IWOB.  Fair aeration bilaterally. CVS:  RRR. Heart sounds normal.  ABD/GI/GU: Abdomen soft.  Honeycomb dressing DCI.  LLQ colostomy. MSK/EXT:  Moves extremities. No apparent deformity. No edema.  SKIN: no apparent skin lesion or wound NEURO: Awake, alert and  oriented appropriately.  No apparent focal neuro deficit. PSYCH: Calm. Normal affect.   Consultants:  General Surgery Interventional radiology  Procedures: 5/2-ex lap with partial sigmoid colectomy and end colostomy  Microbiology summarized: Blood cultures NGTD  Assessment and plan: Diverticulitis with abscess and obstruction/sigmoid stricture -S/p ex lap with partial sigmoid colectomy and end colostomy -Pain control and diet per general surgery -Continue IV ceftriaxone  and Flagyl  -Continue gentle IV fluid    AKI on CKD-3A: B/l Cr ~1.0.  In the setting of poor p.o. intake and concurrent use of diuretics.  Resolved. Recent Labs    08/31/23 2144 09/01/23 0550 09/02/23 0419 09/03/23 0500 10/06/23 1812 10/07/23 0645 10/08/23 1159  BUN 24* 21 16 14  25* 27* 18  CREATININE 1.23* 0.99 1.05* 1.07* 1.47* 1.33* 1.11*  -Continue gentle IV fluid -Continue holding torsemide -Avoid nephrotoxic meds  Anemia of renal disease: Slight drop in Hgb likely dilutional.  Reported EBL only 50 cc. Recent Labs    08/31/23 2144 09/01/23 0550 09/02/23 0419 09/03/23 0500 10/06/23 1812 10/07/23 0645 10/08/23 1159  HGB 12.2 10.8* 10.8* 9.8* 9.7* 9.0* 7.9*  - Check anemia panel - Monitor H&H   Inflammatory arthritis  -Hold Plaquenil for now    Depression - Continue home Effexor   Underweight Body mass index is 17.17 kg/m. -Consult dietitian when diet advanced.          DVT prophylaxis:  SCDs Start: 10/06/23 2115  Code Status: Full code Family Communication: Updated patient's sister and brother-in-law  at bedside Level of care: Med-Surg Status is: Inpatient Remains inpatient appropriate because: Sigmoid diverticulitis with abscess and stricture   Final disposition: Home   55 minutes with more than 50% spent in reviewing records, counseling patient/family and coordinating care.   Sch Meds:  Scheduled Meds:  donepezil   5 mg Oral QHS   feeding supplement  1 Container  Oral TID BM   mupirocin  ointment   Nasal BID   pantoprazole   40 mg Oral Daily   sodium chloride  flush  3 mL Intravenous Q12H   venlafaxine  XR  75 mg Oral Daily   Continuous Infusions:  cefTRIAXone  (ROCEPHIN )  IV 2 g (10/07/23 2006)   metronidazole  500 mg (10/07/23 2118)   PRN Meds:.acetaminophen  **OR** acetaminophen , fentaNYL  (SUBLIMAZE ) injection, methocarbamol  (ROBAXIN ) injection, ondansetron  (ZOFRAN ) IV, oxyCODONE , prochlorperazine   Antimicrobials: Anti-infectives (From admission, onward)    Start     Dose/Rate Route Frequency Ordered Stop   10/07/23 2030  cefTRIAXone  (ROCEPHIN ) 2 g in sodium chloride  0.9 % 100 mL IVPB        2 g 200 mL/hr over 30 Minutes Intravenous Every 24 hours 10/06/23 2116     10/07/23 0830  metroNIDAZOLE  (FLAGYL ) IVPB 500 mg        500 mg 100 mL/hr over 60 Minutes Intravenous Every 12 hours 10/06/23 2116     10/06/23 2030  cefTRIAXone  (ROCEPHIN ) 2 g in sodium chloride  0.9 % 100 mL IVPB       Placed in "And" Linked Group   2 g 200 mL/hr over 30 Minutes Intravenous  Once 10/06/23 2025 10/06/23 2201   10/06/23 2030  metroNIDAZOLE  (FLAGYL ) IVPB 500 mg       Placed in "And" Linked Group   500 mg 100 mL/hr over 60 Minutes Intravenous  Once 10/06/23 2025 10/06/23 2320        I have personally reviewed the following labs and images: CBC: Recent Labs  Lab 10/06/23 1812 10/07/23 0645 10/08/23 1159  WBC 9.6 11.9* 11.9*  NEUTROABS 8.1*  --   --   HGB 9.7* 9.0* 7.9*  HCT 32.0* 28.9* 25.8*  MCV 93.8 90.9 92.5  PLT 411* 395 369   BMP &GFR Recent Labs  Lab 10/06/23 1812 10/07/23 0645 10/08/23 1159  NA 137 136 138  K 3.6 3.6 3.5  CL 93* 93* 101  CO2 25 26 20*  GLUCOSE 80 103* 89  BUN 25* 27* 18  CREATININE 1.47* 1.33* 1.11*  CALCIUM  9.6 9.1 8.4*  MG  --  2.4 1.8   Estimated Creatinine Clearance: 24.6 mL/min (A) (by C-G formula based on SCr of 1.11 mg/dL (H)). Liver & Pancreas: Recent Labs  Lab 10/06/23 1812  AST 20  ALT 13  ALKPHOS 49   BILITOT 0.5  PROT 6.4*  ALBUMIN 2.9*   No results for input(s): "LIPASE", "AMYLASE" in the last 168 hours. No results for input(s): "AMMONIA" in the last 168 hours. Diabetic: No results for input(s): "HGBA1C" in the last 72 hours. No results for input(s): "GLUCAP" in the last 168 hours. Cardiac Enzymes: No results for input(s): "CKTOTAL", "CKMB", "CKMBINDEX", "TROPONINI" in the last 168 hours. No results for input(s): "PROBNP" in the last 8760 hours. Coagulation Profile: No results for input(s): "INR", "PROTIME" in the last 168 hours. Thyroid  Function Tests: No results for input(s): "TSH", "T4TOTAL", "FREET4", "T3FREE", "THYROIDAB" in the last 72 hours. Lipid Profile: No results for input(s): "CHOL", "HDL", "LDLCALC", "TRIG", "CHOLHDL", "LDLDIRECT" in the last 72 hours. Anemia Panel: No results for input(s): "VITAMINB12", "  FOLATE", "FERRITIN", "TIBC", "IRON", "RETICCTPCT" in the last 72 hours. Urine analysis:    Component Value Date/Time   COLORURINE YELLOW 10/06/2023 1812   APPEARANCEUR CLEAR 10/06/2023 1812   LABSPEC >1.046 (H) 10/06/2023 1812   PHURINE 6.0 10/06/2023 1812   GLUCOSEU NEGATIVE 10/06/2023 1812   HGBUR SMALL (A) 10/06/2023 1812   BILIRUBINUR NEGATIVE 10/06/2023 1812   KETONESUR 5 (A) 10/06/2023 1812   PROTEINUR 30 (A) 10/06/2023 1812   NITRITE NEGATIVE 10/06/2023 1812   LEUKOCYTESUR NEGATIVE 10/06/2023 1812   Sepsis Labs: Invalid input(s): "PROCALCITONIN", "LACTICIDVEN"  Microbiology: Recent Results (from the past 240 hours)  Blood culture (routine x 2)     Status: None (Preliminary result)   Collection Time: 10/06/23  7:07 PM   Specimen: BLOOD RIGHT ARM  Result Value Ref Range Status   Specimen Description BLOOD RIGHT ARM  Final   Special Requests   Final    BOTTLES DRAWN AEROBIC AND ANAEROBIC Blood Culture adequate volume   Culture   Final    NO GROWTH 2 DAYS Performed at [facility], [address] [place], [place] [address]    Report  Status PENDING  Incomplete  Blood culture (routine x 2)     Status: None (Preliminary result)   Collection Time: 10/06/23 10:55 PM   Specimen: BLOOD RIGHT HAND  Result Value Ref Range Status   Specimen Description BLOOD RIGHT HAND  Final   Special Requests   Final    BOTTLES DRAWN AEROBIC AND ANAEROBIC Blood Culture adequate volume   Culture   Final    NO GROWTH 2 DAYS Performed at [facility], [address], [place], [place] [address]    Report Status PENDING  Incomplete  Surgical pcr screen     Status: None   Collection Time: 10/08/23  5:54 AM   Specimen: Nasal Mucosa; Nasal Swab  Result Value Ref Range Status   MRSA, PCR NEGATIVE NEGATIVE Final   Staphylococcus aureus NEGATIVE NEGATIVE Final    Comment: (NOTE) The Xpert SA Assay (FDA approved for NASAL specimens in patients 42 years of age and older), is one component of a comprehensive surveillance program. It is not intended to diagnose infection nor to guide or monitor treatment. Performed at [facility], [address] [place], [place] [address]     Radiology Studies: No results found.     Ronniesha Seibold T. Norva Bowe [facility]  If 7PM-7AM, please contact night-coverage [email or web address] 10/08/2023, 3:46 PM

## 2023-10-09 DIAGNOSIS — F32A Depression, unspecified: Secondary | ICD-10-CM | POA: Diagnosis not present

## 2023-10-09 DIAGNOSIS — K56699 Other intestinal obstruction unspecified as to partial versus complete obstruction: Secondary | ICD-10-CM

## 2023-10-09 DIAGNOSIS — N179 Acute kidney failure, unspecified: Secondary | ICD-10-CM | POA: Diagnosis not present

## 2023-10-09 DIAGNOSIS — M064 Inflammatory polyarthropathy: Secondary | ICD-10-CM | POA: Diagnosis not present

## 2023-10-09 DIAGNOSIS — K529 Noninfective gastroenteritis and colitis, unspecified: Secondary | ICD-10-CM | POA: Diagnosis not present

## 2023-10-09 LAB — CBC
HCT: 26 % — ABNORMAL LOW (ref 36.0–46.0)
Hemoglobin: 8 g/dL — ABNORMAL LOW (ref 12.0–15.0)
MCH: 28 pg (ref 26.0–34.0)
MCHC: 30.8 g/dL (ref 30.0–36.0)
MCV: 90.9 fL (ref 80.0–100.0)
Platelets: 382 10*3/uL (ref 150–400)
RBC: 2.86 MIL/uL — ABNORMAL LOW (ref 3.87–5.11)
RDW: 15.9 % — ABNORMAL HIGH (ref 11.5–15.5)
WBC: 11.1 10*3/uL — ABNORMAL HIGH (ref 4.0–10.5)
nRBC: 0 % (ref 0.0–0.2)

## 2023-10-09 LAB — MAGNESIUM: Magnesium: 1.7 mg/dL (ref 1.7–2.4)

## 2023-10-09 LAB — BASIC METABOLIC PANEL WITH GFR
Anion gap: 12 (ref 5–15)
BUN: 12 mg/dL (ref 8–23)
CO2: 24 mmol/L (ref 22–32)
Calcium: 8.1 mg/dL — ABNORMAL LOW (ref 8.9–10.3)
Chloride: 99 mmol/L (ref 98–111)
Creatinine, Ser: 0.95 mg/dL (ref 0.44–1.00)
GFR, Estimated: >60 mL/min (ref >60)
Glucose, Bld: 105 mg/dL — ABNORMAL HIGH (ref 70–99)
Potassium: 2.9 mmol/L — ABNORMAL LOW (ref 3.5–5.1)
Sodium: 135 mmol/L (ref 135–145)

## 2023-10-09 MED ORDER — METHOCARBAMOL 1000 MG/10ML IJ SOLN
500.0000 mg | Freq: Three times a day (TID) | INTRAMUSCULAR | Status: DC
  Administered 2023-10-09 – 2023-10-11 (×6): 500 mg via INTRAVENOUS
  Filled 2023-10-09 (×6): qty 10

## 2023-10-09 MED ORDER — POTASSIUM CHLORIDE 20 MEQ PO PACK
40.0000 meq | PACK | ORAL | Status: AC
  Administered 2023-10-09 (×2): 40 meq via ORAL
  Filled 2023-10-09 (×2): qty 2

## 2023-10-09 MED ORDER — GABAPENTIN 100 MG PO CAPS
100.0000 mg | ORAL_CAPSULE | Freq: Three times a day (TID) | ORAL | Status: DC
  Administered 2023-10-09 – 2023-10-11 (×7): 100 mg via ORAL
  Filled 2023-10-09 (×7): qty 1

## 2023-10-09 MED ORDER — ACETAMINOPHEN 500 MG PO TABS
1000.0000 mg | ORAL_TABLET | Freq: Four times a day (QID) | ORAL | Status: DC
  Administered 2023-10-09 – 2023-10-11 (×7): 1000 mg via ORAL
  Filled 2023-10-09 (×8): qty 2

## 2023-10-09 MED ORDER — HYDROMORPHONE HCL 1 MG/ML IJ SOLN
0.5000 mg | INTRAMUSCULAR | Status: DC | PRN
  Administered 2023-10-10: 0.5 mg via INTRAVENOUS
  Filled 2023-10-09: qty 0.5

## 2023-10-09 MED ORDER — MAGNESIUM SULFATE 2 GM/50ML IV SOLN
2.0000 g | Freq: Once | INTRAVENOUS | Status: AC
  Administered 2023-10-09: 2 g via INTRAVENOUS
  Filled 2023-10-09: qty 50

## 2023-10-09 NOTE — Plan of Care (Signed)

## 2023-10-09 NOTE — Progress Notes (Signed)
 PROGRESS NOTE  Vanessa Cisneros ZOX:[id number] DOB: 1946-03-08   PCP: [name], MD  Patient is from: Home.  DOA: 10/06/2023 LOS: 3  Chief complaints Chief Complaint  Patient presents with   Abdominal Pain     Brief Narrative / Interim history: 78 year old F with PMH of inflammatory arthritis, CKD-3A, lumbar stenosis, depression and recent hospitalization for acute colitis for which she was treated with antibiotics returning with abdominal pain, decreased appetite, weight loss, nausea and vomiting, and admitted with working diagnosis of acute colitis with diverticulitis with abscess.   In ED, stable vitals.  No leukocytosis.  CT abdomen and pelvis demonstrated rectosigmoid wall thickening with 4.7 x 2.9 x 1.9 cm rim-enhancing lesion concerning for abscess.  General surgery consulted.  Patient was started on IV fluids, CTX and Flagyl  and admitted.  Patient underwent ex lap with partial sigmoid colectomy and end colostomy by Dr. Lanell Pinta on 5/2.   Subjective: Seen and examined earlier this morning.  No major events overnight or this morning.  Reports some abdominal pain.  Denies nausea.  Having ostomy output.  Objective: Vitals:   10/09/23 0019 10/09/23 0432 10/09/23 0828 10/09/23 1223  BP: (!) 151/64 (!) 148/57 (!) 137/58 (!) 120/43  Pulse: 99 93 88 95  Resp: 14 17 17 17   Temp: 98.7 F (37.1 C) 98.8 F (37.1 C) 98.4 F (36.9 C) 98.2 F (36.8 C)  TempSrc:   Oral Oral  SpO2: 97% 98% 97% 97%  Weight:      Height:        Examination:  GENERAL: No apparent distress.  Nontoxic. HEENT: MMM.  Vision and hearing grossly intact.  NECK: Supple.  No apparent JVD.  RESP:  No IWOB.  Fair aeration bilaterally. CVS:  RRR. Heart sounds normal.  ABD/GI/GU: Abdomen soft.  Appropriately tender.  Dressing DCI.  LLQ colostomy with brown liquid stool. MSK/EXT:  Moves extremities. No apparent deformity. No edema.  SKIN: no apparent skin lesion or wound NEURO: Awake, alert and  oriented appropriately.  No apparent focal neuro deficit. PSYCH: Calm. Normal affect.   Consultants:  General Surgery Interventional radiology  Procedures: 5/2-ex lap with partial sigmoid colectomy and end colostomy  Microbiology summarized: Blood cultures NGTD  Assessment and plan: Diverticulitis with abscess and obstruction/sigmoid stricture -S/p ex lap with partial sigmoid colectomy and end colostomy -Pain control and diet per general surgery -Continue IV ceftriaxone  and Flagyl  - Mobilize.  PT/OT -Discontinue Foley catheter   AKI on CKD-3A: B/l Cr ~1.0.  In the setting of poor p.o. intake and concurrent use of diuretics.  Resolved. Recent Labs    08/31/23 2144 09/01/23 0550 09/02/23 0419 09/03/23 0500 10/06/23 1812 10/07/23 0645 10/08/23 1159 10/09/23 0602  BUN 24* 21 16 14  25* 27* 18 12  CREATININE 1.23* 0.99 1.05* 1.07* 1.47* 1.33* 1.11* 0.95  -Continue holding torsemide -Avoid nephrotoxic meds  Anemia of renal disease: Slight drop in Hgb likely dilutional.  Reported EBL only 50 cc.  Anemia panel suggests anemia of chronic disease Recent Labs    08/31/23 2144 09/01/23 0550 09/02/23 0419 09/03/23 0500 10/06/23 1812 10/07/23 0645 10/08/23 0930 10/08/23 1159 10/09/23 0602  HGB 12.2 10.8* 10.8* 9.8* 9.7* 9.0* 7.1* 7.9* 8.0*  - Monitor   Inflammatory arthritis  -Hold Plaquenil for now    Depression - Continue home Effexor   Underweight Body mass index is 17.17 kg/m. -Consult dietitian          DVT prophylaxis:  SCDs Start: 10/06/23 2115  Code Status: Full  code Family Communication: None at bedside today Level of care: Med-Surg Status is: Inpatient Remains inpatient appropriate because: Sigmoid diverticulitis with abscess and stricture   Final disposition: Home   35 minutes with more than 50% spent in reviewing records, counseling patient/family and coordinating care.   Sch Meds:  Scheduled Meds:  acetaminophen   1,000 mg Oral Q6H    donepezil   5 mg Oral QHS   feeding supplement  1 Container Oral TID BM   gabapentin  100 mg Oral TID   methocarbamol  (ROBAXIN ) injection  500 mg Intravenous Q8H   mupirocin  ointment   Nasal BID   pantoprazole   40 mg Oral Daily   sodium chloride  flush  3 mL Intravenous Q12H   venlafaxine  XR  75 mg Oral Daily   Continuous Infusions:  cefTRIAXone  (ROCEPHIN )  IV 2 g (10/08/23 2248)   lactated ringers  40 mL/hr at 10/09/23 0057   metronidazole  500 mg (10/09/23 0913)   PRN Meds:.HYDROmorphone (DILAUDID) injection, ondansetron  (ZOFRAN ) IV, oxyCODONE   Antimicrobials: Anti-infectives (From admission, onward)    Start     Dose/Rate Route Frequency Ordered Stop   10/07/23 2030  cefTRIAXone  (ROCEPHIN ) 2 g in sodium chloride  0.9 % 100 mL IVPB        2 g 200 mL/hr over 30 Minutes Intravenous Every 24 hours 10/06/23 2116     10/07/23 0830  metroNIDAZOLE  (FLAGYL ) IVPB 500 mg        500 mg 100 mL/hr over 60 Minutes Intravenous Every 12 hours 10/06/23 2116     10/06/23 2030  cefTRIAXone  (ROCEPHIN ) 2 g in sodium chloride  0.9 % 100 mL IVPB       Placed in "And" Linked Group   2 g 200 mL/hr over 30 Minutes Intravenous  Once 10/06/23 2025 10/06/23 2201   10/06/23 2030  metroNIDAZOLE  (FLAGYL ) IVPB 500 mg       Placed in "And" Linked Group   500 mg 100 mL/hr over 60 Minutes Intravenous  Once 10/06/23 2025 10/06/23 2320        I have personally reviewed the following labs and images: CBC: Recent Labs  Lab 10/06/23 1812 10/07/23 0645 10/08/23 0930 10/08/23 1159 10/09/23 0602  WBC 9.6 11.9*  --  11.9* 11.1*  NEUTROABS 8.1*  --   --   --   --   HGB 9.7* 9.0* 7.1* 7.9* 8.0*  HCT 32.0* 28.9* 21.0* 25.8* 26.0*  MCV 93.8 90.9  --  92.5 90.9  PLT 411* 395  --  369 382   BMP &GFR Recent Labs  Lab 10/06/23 1812 10/07/23 0645 10/08/23 0930 10/08/23 1159 10/09/23 0602  NA 137 136 136 138 135  K 3.6 3.6 3.0* 3.5 2.9*  CL 93* 93*  --  101 99  CO2 25 26  --  20* 24  GLUCOSE 80 103*  --  89  105*  BUN 25* 27*  --  18 12  CREATININE 1.47* 1.33*  --  1.11* 0.95  CALCIUM  9.6 9.1  --  8.4* 8.1*  MG  --  2.4  --  1.8 1.7   Estimated Creatinine Clearance: 28.8 mL/min (by C-G formula based on SCr of 0.95 mg/dL). Liver & Pancreas: Recent Labs  Lab 10/06/23 1812  AST 20  ALT 13  ALKPHOS 49  BILITOT 0.5  PROT 6.4*  ALBUMIN 2.9*   No results for input(s): "LIPASE", "AMYLASE" in the last 168 hours. No results for input(s): "AMMONIA" in the last 168 hours. Diabetic: No results for input(s): "HGBA1C" in  the last 72 hours. No results for input(s): "GLUCAP" in the last 168 hours. Cardiac Enzymes: No results for input(s): "CKTOTAL", "CKMB", "CKMBINDEX", "TROPONINI" in the last 168 hours. No results for input(s): "PROBNP" in the last 8760 hours. Coagulation Profile: No results for input(s): "INR", "PROTIME" in the last 168 hours. Thyroid  Function Tests: No results for input(s): "TSH", "T4TOTAL", "FREET4", "T3FREE", "THYROIDAB" in the last 72 hours. Lipid Profile: No results for input(s): "CHOL", "HDL", "LDLCALC", "TRIG", "CHOLHDL", "LDLDIRECT" in the last 72 hours. Anemia Panel: Recent Labs    10/08/23 1613  VITAMINB12 3,642*  FOLATE 16.1  FERRITIN 339*  TIBC 139*  IRON 7*  RETICCTPCT 2.6   Urine analysis:    Component Value Date/Time   COLORURINE YELLOW 10/06/2023 1812   APPEARANCEUR CLEAR 10/06/2023 1812   LABSPEC >1.046 (H) 10/06/2023 1812   PHURINE 6.0 10/06/2023 1812   GLUCOSEU NEGATIVE 10/06/2023 1812   HGBUR SMALL (A) 10/06/2023 1812   BILIRUBINUR NEGATIVE 10/06/2023 1812   KETONESUR 5 (A) 10/06/2023 1812   PROTEINUR 30 (A) 10/06/2023 1812   NITRITE NEGATIVE 10/06/2023 1812   LEUKOCYTESUR NEGATIVE 10/06/2023 1812   Sepsis Labs: Invalid input(s): "PROCALCITONIN", "LACTICIDVEN"  Microbiology: Recent Results (from the past 240 hours)  Blood culture (routine x 2)     Status: None (Preliminary result)   Collection Time: 10/06/23  7:07 PM   Specimen: BLOOD  RIGHT ARM  Result Value Ref Range Status   Specimen Description BLOOD RIGHT ARM  Final   Special Requests   Final    BOTTLES DRAWN AEROBIC AND ANAEROBIC Blood Culture adequate volume   Culture   Final    NO GROWTH 3 DAYS Performed at [facility], [address] [place], [place] [address]    Report Status PENDING  Incomplete  Blood culture (routine x 2)     Status: None (Preliminary result)   Collection Time: 10/06/23 10:55 PM   Specimen: BLOOD RIGHT HAND  Result Value Ref Range Status   Specimen Description BLOOD RIGHT HAND  Final   Special Requests   Final    BOTTLES DRAWN AEROBIC AND ANAEROBIC Blood Culture adequate volume   Culture   Final    NO GROWTH 3 DAYS Performed at [facility], [address] [place], [place] [address]    Report Status PENDING  Incomplete  Surgical pcr screen     Status: None   Collection Time: 10/08/23  5:54 AM   Specimen: Nasal Mucosa; Nasal Swab  Result Value Ref Range Status   MRSA, PCR NEGATIVE NEGATIVE Final   Staphylococcus aureus NEGATIVE NEGATIVE Final    Comment: (NOTE) The Xpert SA Assay (FDA approved for NASAL specimens in patients 2 years of age and older), is one component of a comprehensive surveillance program. It is not intended to diagnose infection nor to guide or monitor treatment. Performed at [facility], [address] [place], [place] [address]     Radiology Studies: No results found.     Vanessa Cisneros T. Jahnasia Tatum [facility]  If 7PM-7AM, please contact night-coverage [email or web address] 10/09/2023, 2:31 PM

## 2023-10-09 NOTE — Evaluation (Signed)
 Physical Therapy Evaluation Patient Details Name: Vanessa Cisneros MRN: [id number] DOB: 08/21/1945 Today's Date: 10/09/2023  History of Present Illness  78 y.o. female presents to [facility] with abdominal pain and weight loss. Pt with acute colitis w/ diverticulitis w/ abscess and AKI. 5/2 ex-lap, partial sigmoid colectomy with end colostomy. PMHx: inflammatory arthritis, depression, CKD 3A, lumbar stenosis, anemia, CHF, GERD   Clinical Impression  Pt in bed upon arrival and agreeable to PT eval. PTA, pt was independent with no AD. She reported having increased difficulty with balance prior to admission and was starting to furniture surf in the home. Trialed no AD, SP cane, and RW with pt having improved steadiness with RW. Pt was CGA/MinA for all mobility at this time. Pt has intermittent physical assist available from family upon d/c home. Recommending OP PT to address balance deficits and improve mobility. Pt currently with functional limitations due to the deficits listed below (see PT Problem List). Pt would benefit from acute skilled PT to address functional impairments. Acute PT to follow.         If plan is discharge home, recommend the following: A little help with walking and/or transfers;Assist for transportation;Help with stairs or ramp for entrance;Assistance with cooking/housework   Can travel by private vehicle    Yes    Equipment Recommendations Rolling walker (2 wheels) (youth RW)     Functional Status Assessment Patient has had a recent decline in their functional status and demonstrates the ability to make significant improvements in function in a reasonable and predictable amount of time.     Precautions / Restrictions Precautions Precaution/Restrictions Comments: s/p ex-lap, colostomy Restrictions Weight Bearing Restrictions Per Provider Order: No      Mobility  Bed Mobility Overal bed mobility: Needs Assistance Bed Mobility: Rolling, Sidelying to Sit, Sit to  Sidelying Rolling: Contact guard assist Sidelying to sit: Contact guard assist, HOB elevated, Used rails     Sit to sidelying: Min assist, HOB elevated General bed mobility comments: CGA for safety with cues for log roll technique. MinA for return to sidelying for slight LE management    Transfers Overall transfer level: Needs assistance Equipment used: Rolling walker (2 wheels), Straight cane, None Transfers: Sit to/from Stand Sit to Stand: Contact guard assist    General transfer comment: CGA for safety, trialed no AD, SP cane and RW with improved stability with RW    Ambulation/Gait Ambulation/Gait assistance: Contact guard assist, Min assist Gait Distance (Feet): 200 Feet Assistive device: Rolling walker (2 wheels), Straight cane, None Gait Pattern/deviations: Step-through pattern, Shuffle, Staggering left, Staggering right, Narrow base of support, Trunk flexed Gait velocity: decr     General Gait Details: unsteady with pt reaching for UE support with no AD. Trialed SP cane and RW with improved stability with RW. Decreasd gait speed with slightly forward flexed trunk to avoid pain in abdomen    Balance Overall balance assessment: Mild deficits observed, not formally tested        Pertinent Vitals/Pain Pain Assessment Pain Assessment: Faces Faces Pain Scale: Hurts little more Pain Location: abdomen with movement Pain Descriptors / Indicators: Aching, Discomfort Pain Intervention(s): Limited activity within patient's tolerance, Monitored during session, Repositioned    Home Living Family/patient expects to be discharged to:: Private residence Living Arrangements: Other relatives (grandson and his wife, two great grandsons) Available Help at Discharge: Family;Available 24 hours/day Type of Home: House Home Access: Stairs to enter Entrance Stairs-Rails: Right Entrance Stairs-Number of Steps: 5   Home Layout: One  level Home Equipment: Jeananne Mighty - single point       Prior Function Prior Level of Function : Independent/Modified Independent    Mobility Comments: Ind w/ no AD, would furniture surf ADLs Comments: Ind with ADLs and iADLs, family cooks and cleans     Extremity/Trunk Assessment   Upper Extremity Assessment Upper Extremity Assessment: Overall WFL for tasks assessed    Lower Extremity Assessment Lower Extremity Assessment: Overall WFL for tasks assessed    Cervical / Trunk Assessment Cervical / Trunk Assessment: Normal  Communication   Communication Communication: No apparent difficulties    Cognition Arousal: Alert Behavior During Therapy: WFL for tasks assessed/performed   PT - Cognitive impairments: No apparent impairments    Following commands: Intact       Cueing Cueing Techniques: Verbal cues     General Comments General comments (skin integrity, edema, etc.): VSS on 2L     PT Assessment Patient needs continued PT services  PT Problem List Decreased activity tolerance;Decreased balance;Decreased mobility       PT Treatment Interventions DME instruction;Gait training;Stair training;Functional mobility training;Therapeutic activities;Therapeutic exercise;Balance training;Neuromuscular re-education;Patient/family education    PT Goals (Current goals can be found in the Care Plan section)  Acute Rehab PT Goals Patient Stated Goal: to work on balance PT Goal Formulation: With patient Time For Goal Achievement: 10/23/23 Potential to Achieve Goals: Good    Frequency Min 2X/week        AM-PAC PT "6 Clicks" Mobility  Outcome Measure Help needed turning from your back to your side while in a flat bed without using bedrails?: A Little Help needed moving from lying on your back to sitting on the side of a flat bed without using bedrails?: A Little Help needed moving to and from a bed to a chair (including a wheelchair)?: A Little Help needed standing up from a chair using your arms (e.g., wheelchair or bedside  chair)?: A Little Help needed to walk in hospital room?: A Little Help needed climbing 3-5 steps with a railing? : A Little 6 Click Score: 18    End of Session Equipment Utilized During Treatment: Oxygen Activity Tolerance: Patient tolerated treatment well Patient left: in bed;with call bell/phone within reach;Other (comment) (NT in room) Nurse Communication: Mobility status PT Visit Diagnosis: Unsteadiness on feet (R26.81);Other abnormalities of gait and mobility (R26.89)    Time: 1610-9604 PT Time Calculation (min) (ACUTE ONLY): 29 min   Charges:   PT Evaluation $PT Eval Low Complexity: 1 Low PT Treatments $Gait Training: 8-22 mins PT General Charges $$ ACUTE PT VISIT: 1 Visit        Orysia Blas, PT, DPT Secure Chat Preferred  Rehab Office [phone]  Vanessa Cisneros 10/09/2023, 5:29 PM

## 2023-10-09 NOTE — Progress Notes (Addendum)
 1 Day Post-Op   Subjective/Chief Complaint: Reports pain control is poor this morning.  No nausea.  Has not really been able to get out of bed.   Objective: Vital signs in last 24 hours: Temp:  [97.8 F (36.6 C)-98.8 F (37.1 C)] 98.4 F (36.9 C) (05/03 0828) Pulse Rate:  [81-99] 88 (05/03 0828) Resp:  [14-18] 17 (05/03 0828) BP: (127-151)/(52-65) 137/58 (05/03 0828) SpO2:  [91 %-98 %] 97 % (05/03 0828) Last BM Date : 10/08/23  Intake/Output from previous day: 05/02 0701 - 05/03 0700 In: 1729.5 [P.O.:118; I.V.:1261.5; IV Piggyback:350] Out: 1200 [Urine:750; Stool:300; Blood:150] Intake/Output this shift: No intake/output data recorded.  Alert, well-appearing Unlabored respirations Abdomen is soft, diffusely tender, no peritoneal signs.  Incisions clean, dry and intact.  Stoma is pink, everted and productive of thick liquid stool.    Lab Results:  Recent Labs    10/08/23 1159 10/09/23 0602  WBC 11.9* 11.1*  HGB 7.9* 8.0*  HCT 25.8* 26.0*  PLT 369 382   BMET Recent Labs    10/08/23 1159 10/09/23 0602  NA 138 135  K 3.5 2.9*  CL 101 99  CO2 20* 24  GLUCOSE 89 105*  BUN 18 12  CREATININE 1.11* 0.95  CALCIUM  8.4* 8.1*   PT/INR No results for input(s): "LABPROT", "INR" in the last 72 hours. ABG Recent Labs    10/08/23 0930  PHART 7.340*  HCO3 23.7    Studies/Results: No results found.  Anti-infectives: Anti-infectives (From admission, onward)    Start     Dose/Rate Route Frequency Ordered Stop   10/07/23 2030  cefTRIAXone  (ROCEPHIN ) 2 g in sodium chloride  0.9 % 100 mL IVPB        2 g 200 mL/hr over 30 Minutes Intravenous Every 24 hours 10/06/23 2116     10/07/23 0830  metroNIDAZOLE  (FLAGYL ) IVPB 500 mg        500 mg 100 mL/hr over 60 Minutes Intravenous Every 12 hours 10/06/23 2116     10/06/23 2030  cefTRIAXone  (ROCEPHIN ) 2 g in sodium chloride  0.9 % 100 mL IVPB       Placed in "And" Linked Group   2 g 200 mL/hr over 30 Minutes Intravenous   Once 10/06/23 2025 10/06/23 2201   10/06/23 2030  metroNIDAZOLE  (FLAGYL ) IVPB 500 mg       Placed in "And" Linked Group   500 mg 100 mL/hr over 60 Minutes Intravenous  Once 10/06/23 2025 10/06/23 2320       Assessment/Plan: s/p Procedure(s) with comments: LAPAROTOMY, EXPLORATORY (N/A) - Washout Intra-abdominal Abscess COLECTOMY, SIGMOID, OPEN (N/A) CREATION, COLOSTOMY (N/A) Continue clears, try full liquids today although she does not have much of an appetite mobilize as tolerated Hypokalemia 2.9-PO replacement ordered Pulmonary toilet Multimodal pain control  Terrel Nesheiwat A Lanell Pinta 10/09/2023

## 2023-10-09 NOTE — Plan of Care (Signed)
   Problem: Elimination: Goal: Will not experience complications related to bowel motility Outcome: Progressing   Problem: Pain Managment: Goal: General experience of comfort will improve and/or be controlled Outcome: Progressing   Problem: Safety: Goal: Ability to remain free from injury will improve Outcome: Progressing

## 2023-10-10 DIAGNOSIS — K529 Noninfective gastroenteritis and colitis, unspecified: Secondary | ICD-10-CM | POA: Diagnosis not present

## 2023-10-10 DIAGNOSIS — M064 Inflammatory polyarthropathy: Secondary | ICD-10-CM | POA: Diagnosis not present

## 2023-10-10 DIAGNOSIS — N179 Acute kidney failure, unspecified: Secondary | ICD-10-CM | POA: Diagnosis not present

## 2023-10-10 DIAGNOSIS — F32A Depression, unspecified: Secondary | ICD-10-CM | POA: Diagnosis not present

## 2023-10-10 LAB — CBC
HCT: 24.2 % — ABNORMAL LOW (ref 36.0–46.0)
Hemoglobin: 7.5 g/dL — ABNORMAL LOW (ref 12.0–15.0)
MCH: 28.1 pg (ref 26.0–34.0)
MCHC: 31 g/dL (ref 30.0–36.0)
MCV: 90.6 fL (ref 80.0–100.0)
Platelets: 349 10*3/uL (ref 150–400)
RBC: 2.67 MIL/uL — ABNORMAL LOW (ref 3.87–5.11)
RDW: 15.8 % — ABNORMAL HIGH (ref 11.5–15.5)
WBC: 10.6 10*3/uL — ABNORMAL HIGH (ref 4.0–10.5)
nRBC: 0 % (ref 0.0–0.2)

## 2023-10-10 LAB — BASIC METABOLIC PANEL WITH GFR
Anion gap: 9 (ref 5–15)
BUN: 9 mg/dL (ref 8–23)
CO2: 23 mmol/L (ref 22–32)
Calcium: 7.9 mg/dL — ABNORMAL LOW (ref 8.9–10.3)
Chloride: 103 mmol/L (ref 98–111)
Creatinine, Ser: 0.75 mg/dL (ref 0.44–1.00)
GFR, Estimated: >60 mL/min (ref >60)
Glucose, Bld: 103 mg/dL — ABNORMAL HIGH (ref 70–99)
Potassium: 3.6 mmol/L (ref 3.5–5.1)
Sodium: 135 mmol/L (ref 135–145)

## 2023-10-10 LAB — MAGNESIUM: Magnesium: 2.2 mg/dL (ref 1.7–2.4)

## 2023-10-10 MED ORDER — IRON SUCROSE 300 MG IVPB - SIMPLE MED
300.0000 mg | Freq: Once | Status: DC
  Filled 2023-10-10: qty 265

## 2023-10-10 MED ORDER — SODIUM CHLORIDE 0.9 % IV SOLN
300.0000 mg | Freq: Once | INTRAVENOUS | Status: AC
  Administered 2023-10-10: 300 mg via INTRAVENOUS
  Filled 2023-10-10: qty 15

## 2023-10-10 NOTE — Progress Notes (Signed)
 Mobility Specialist Progress Note:   10/10/23 0950  Mobility  Activity Ambulated with assistance in hallway  Level of Assistance Contact guard assist, steadying assist  Assistive Device Front wheel walker (youth)  Distance Ambulated (ft) 250 ft  Activity Response Tolerated well  Mobility Referral Yes  Mobility visit 1 Mobility  Mobility Specialist Start Time (ACUTE ONLY) 0950  Mobility Specialist Stop Time (ACUTE ONLY) 1005  Mobility Specialist Time Calculation (min) (ACUTE ONLY) 15 min   Pt agreeable to mobility session. Required only minG assist throughout ambulation with youth RW. Pt with no c/o throughout, left sitting up in chair with all needs met.   Oneda Big Mobility Specialist Please contact via SecureChat or  Rehab office at [phone]

## 2023-10-10 NOTE — Plan of Care (Signed)

## 2023-10-10 NOTE — Plan of Care (Signed)
  Problem: Education: Goal: Knowledge of General Education information will improve Description: Including pain rating scale, medication(s)/side effects and non-pharmacologic comfort measures Outcome: Progressing   Problem: Nutrition: Goal: Adequate nutrition will be maintained Outcome: Progressing   Problem: Elimination: Goal: Will not experience complications related to bowel motility Outcome: Progressing   Problem: Pain Managment: Goal: General experience of comfort will improve and/or be controlled Outcome: Progressing   Problem: Safety: Goal: Ability to remain free from injury will improve Outcome: Progressing

## 2023-10-10 NOTE — Progress Notes (Signed)
 PROGRESS NOTE  Vanessa Cisneros ZOX:[id number] DOB: Oct 16, 1945   PCP: [name], MD  Patient is from: Home.  DOA: 10/06/2023 LOS: 4  Chief complaints Chief Complaint  Patient presents with   Abdominal Pain     Brief Narrative / Interim history: 78 year old F with PMH of inflammatory arthritis, CKD-3A, lumbar stenosis, depression and recent hospitalization for acute colitis for which she was treated with antibiotics returning with abdominal pain, decreased appetite, weight loss, nausea and vomiting, and admitted with working diagnosis of acute colitis with diverticulitis with abscess.   In ED, stable vitals.  No leukocytosis.  CT abdomen and pelvis demonstrated rectosigmoid wall thickening with 4.7 x 2.9 x 1.9 cm rim-enhancing lesion concerning for abscess.  General surgery consulted.  Patient was started on IV fluids, CTX and Flagyl  and admitted.  Patient underwent ex lap with partial sigmoid colectomy and end colostomy by Dr. Lanell Pinta on 5/2.  Improving.   Subjective: Seen and examined earlier this morning.  No major events overnight of this morning.  No complaints.  Tolerated regular diet this morning.  Ambulating.  Objective: Vitals:   10/09/23 2024 10/10/23 0458 10/10/23 0839 10/10/23 1000  BP: (!) 114/44 (!) 118/45 (!) 126/46 (!) 117/43  Pulse: 88 92 88 91  Resp: 17 17 17 17   Temp: (!) 97.5 F (36.4 C) 99.1 F (37.3 C)  98.4 F (36.9 C)  TempSrc:    Oral  SpO2: 97% 93% 93% 95%  Weight:      Height:        Examination:  GENERAL: No apparent distress.  Nontoxic.  Sitting on bedside chair. HEENT: MMM.  Vision and hearing grossly intact.  NECK: Supple.  No apparent JVD.  RESP:  No IWOB.  Fair aeration bilaterally. CVS:  RRR. Heart sounds normal.  ABD/GI/GU: Abdomen soft.  Appropriately tender.  Dressing DCI.  LLQ colostomy with stool. MSK/EXT:  Moves extremities. No apparent deformity. No edema.  SKIN: no apparent skin lesion or wound NEURO: Awake, alert  and oriented appropriately.  No apparent focal neuro deficit. PSYCH: Calm. Normal affect.   Consultants:  General Surgery Interventional radiology  Procedures: 5/2-ex lap with partial sigmoid colectomy and end colostomy  Microbiology summarized: Blood cultures NGTD  Assessment and plan: Diverticulitis with abscess and obstruction/sigmoid stricture -S/p ex lap with partial sigmoid colectomy and end colostomy -Pain control and diet per general surgery-on regular diet. -Continue IV ceftriaxone  and Flagyl  -Mobilize.  PT/OT   AKI: B/l Cr ~1.0?.  Due to poor p.o. intake and concurrent use of diuretics.  Resolved.  CKD ruled out Recent Labs    08/31/23 2144 09/01/23 0550 09/02/23 0419 09/03/23 0500 10/06/23 1812 10/07/23 0645 10/08/23 1159 10/09/23 0602 10/10/23 0426  BUN 24* 21 16 14  25* 27* 18 12 9   CREATININE 1.23* 0.99 1.05* 1.07* 1.47* 1.33* 1.11* 0.95 0.75  -Continue holding torsemide -Avoid nephrotoxic meds  Anemia of renal disease: Slight drop in Hgb likely dilutional.  Reported EBL only 50 cc.  Some iron deficiency on anemia panel. Recent Labs    08/31/23 2144 09/01/23 0550 09/02/23 0419 09/03/23 0500 10/06/23 1812 10/07/23 0645 10/08/23 0930 10/08/23 1159 10/09/23 0602 10/10/23 0426  HGB 12.2 10.8* 10.8* 9.8* 9.7* 9.0* 7.1* 7.9* 8.0* 7.5*  - IV Venofer 300 mg x 1 - Monitor CBC   Inflammatory arthritis  -Hold Plaquenil for now    Depression -Continue home Effexor   Hypokalemia -Monitor replenish as appropriate  Underweight Body mass index is 17.17 kg/m. -Consulted dietitian  DVT prophylaxis:  SCDs Start: 10/06/23 2115  Code Status: Full code Family Communication: None at bedside today Level of care: Med-Surg Status is: Inpatient Remains inpatient appropriate because: Sigmoid diverticulitis with abscess and stricture   Final disposition: Home   35 minutes with more than 50% spent in reviewing records, counseling  patient/family and coordinating care.   Sch Meds:  Scheduled Meds:  acetaminophen   1,000 mg Oral Q6H   donepezil   5 mg Oral QHS   feeding supplement  1 Container Oral TID BM   gabapentin  100 mg Oral TID   methocarbamol  (ROBAXIN ) injection  500 mg Intravenous Q8H   mupirocin  ointment   Nasal BID   pantoprazole   40 mg Oral Daily   sodium chloride  flush  3 mL Intravenous Q12H   venlafaxine  XR  75 mg Oral Daily   Continuous Infusions:  cefTRIAXone  (ROCEPHIN )  IV 2 g (10/09/23 2038)   metronidazole  500 mg (10/10/23 0817)   PRN Meds:.HYDROmorphone (DILAUDID) injection, ondansetron  (ZOFRAN ) IV, oxyCODONE   Antimicrobials: Anti-infectives (From admission, onward)    Start     Dose/Rate Route Frequency Ordered Stop   10/07/23 2030  cefTRIAXone  (ROCEPHIN ) 2 g in sodium chloride  0.9 % 100 mL IVPB        2 g 200 mL/hr over 30 Minutes Intravenous Every 24 hours 10/06/23 2116     10/07/23 0830  metroNIDAZOLE  (FLAGYL ) IVPB 500 mg        500 mg 100 mL/hr over 60 Minutes Intravenous Every 12 hours 10/06/23 2116     10/06/23 2030  cefTRIAXone  (ROCEPHIN ) 2 g in sodium chloride  0.9 % 100 mL IVPB       Placed in "And" Linked Group   2 g 200 mL/hr over 30 Minutes Intravenous  Once 10/06/23 2025 10/06/23 2201   10/06/23 2030  metroNIDAZOLE  (FLAGYL ) IVPB 500 mg       Placed in "And" Linked Group   500 mg 100 mL/hr over 60 Minutes Intravenous  Once 10/06/23 2025 10/06/23 2320        I have personally reviewed the following labs and images: CBC: Recent Labs  Lab 10/06/23 1812 10/07/23 0645 10/08/23 0930 10/08/23 1159 10/09/23 0602 10/10/23 0426  WBC 9.6 11.9*  --  11.9* 11.1* 10.6*  NEUTROABS 8.1*  --   --   --   --   --   HGB 9.7* 9.0* 7.1* 7.9* 8.0* 7.5*  HCT 32.0* 28.9* 21.0* 25.8* 26.0* 24.2*  MCV 93.8 90.9  --  92.5 90.9 90.6  PLT 411* 395  --  369 382 349   BMP &GFR Recent Labs  Lab 10/06/23 1812 10/07/23 0645 10/08/23 0930 10/08/23 1159 10/09/23 0602 10/10/23 0426   NA 137 136 136 138 135 135  K 3.6 3.6 3.0* 3.5 2.9* 3.6  CL 93* 93*  --  101 99 103  CO2 25 26  --  20* 24 23  GLUCOSE 80 103*  --  89 105* 103*  BUN 25* 27*  --  18 12 9   CREATININE 1.47* 1.33*  --  1.11* 0.95 0.75  CALCIUM  9.6 9.1  --  8.4* 8.1* 7.9*  MG  --  2.4  --  1.8 1.7 2.2   Estimated Creatinine Clearance: 34.2 mL/min (by C-G formula based on SCr of 0.75 mg/dL). Liver & Pancreas: Recent Labs  Lab 10/06/23 1812  AST 20  ALT 13  ALKPHOS 49  BILITOT 0.5  PROT 6.4*  ALBUMIN 2.9*   No results for input(s): "LIPASE", "  AMYLASE" in the last 168 hours. No results for input(s): "AMMONIA" in the last 168 hours. Diabetic: No results for input(s): "HGBA1C" in the last 72 hours. No results for input(s): "GLUCAP" in the last 168 hours. Cardiac Enzymes: No results for input(s): "CKTOTAL", "CKMB", "CKMBINDEX", "TROPONINI" in the last 168 hours. No results for input(s): "PROBNP" in the last 8760 hours. Coagulation Profile: No results for input(s): "INR", "PROTIME" in the last 168 hours. Thyroid  Function Tests: No results for input(s): "TSH", "T4TOTAL", "FREET4", "T3FREE", "THYROIDAB" in the last 72 hours. Lipid Profile: No results for input(s): "CHOL", "HDL", "LDLCALC", "TRIG", "CHOLHDL", "LDLDIRECT" in the last 72 hours. Anemia Panel: Recent Labs    10/08/23 1613  VITAMINB12 3,642*  FOLATE 16.1  FERRITIN 339*  TIBC 139*  IRON 7*  RETICCTPCT 2.6   Urine analysis:    Component Value Date/Time   COLORURINE YELLOW 10/06/2023 1812   APPEARANCEUR CLEAR 10/06/2023 1812   LABSPEC >1.046 (H) 10/06/2023 1812   PHURINE 6.0 10/06/2023 1812   GLUCOSEU NEGATIVE 10/06/2023 1812   HGBUR SMALL (A) 10/06/2023 1812   BILIRUBINUR NEGATIVE 10/06/2023 1812   KETONESUR 5 (A) 10/06/2023 1812   PROTEINUR 30 (A) 10/06/2023 1812   NITRITE NEGATIVE 10/06/2023 1812   LEUKOCYTESUR NEGATIVE 10/06/2023 1812   Sepsis Labs: Invalid input(s): "PROCALCITONIN",  "LACTICIDVEN"  Microbiology: Recent Results (from the past 240 hours)  Blood culture (routine x 2)     Status: None (Preliminary result)   Collection Time: 10/06/23  7:07 PM   Specimen: BLOOD RIGHT ARM  Result Value Ref Range Status   Specimen Description BLOOD RIGHT ARM  Final   Special Requests   Final    BOTTLES DRAWN AEROBIC AND ANAEROBIC Blood Culture adequate volume   Culture   Final    NO GROWTH 4 DAYS Performed at [facility], [address], [place], [place] [address]    Report Status PENDING  Incomplete  Blood culture (routine x 2)     Status: None (Preliminary result)   Collection Time: 10/06/23 10:55 PM   Specimen: BLOOD RIGHT HAND  Result Value Ref Range Status   Specimen Description BLOOD RIGHT HAND  Final   Special Requests   Final    BOTTLES DRAWN AEROBIC AND ANAEROBIC Blood Culture adequate volume   Culture   Final    NO GROWTH 4 DAYS Performed at [facility], [address] [place], [place] [address]    Report Status PENDING  Incomplete  Surgical pcr screen     Status: None   Collection Time: 10/08/23  5:54 AM   Specimen: Nasal Mucosa; Nasal Swab  Result Value Ref Range Status   MRSA, PCR NEGATIVE NEGATIVE Final   Staphylococcus aureus NEGATIVE NEGATIVE Final    Comment: (NOTE) The Xpert SA Assay (FDA approved for NASAL specimens in patients 10 years of age and older), is one component of a comprehensive surveillance program. It is not intended to diagnose infection nor to guide or monitor treatment. Performed at [facility], [address] [place], [place] [address]     Radiology Studies: No results found.     Brileigh Sevcik T. Hillary Struss [facility]  If 7PM-7AM, please contact night-coverage [email or web address] 10/10/2023, 3:57 PM

## 2023-10-10 NOTE — Progress Notes (Signed)
 2 Days Post-Op   Subjective/Chief Complaint: Feeling much better this morning.  Pain has significantly subsided.  Appetite is coming back.  She has been walking to the bathroom and is doing well with this.  Objective: Vital signs in last 24 hours: Temp:  [97.5 F (36.4 C)-99.1 F (37.3 C)] 99.1 F (37.3 C) (05/04 0458) Pulse Rate:  [87-95] 88 (05/04 0839) Resp:  [17] 17 (05/04 0839) BP: (114-126)/(43-49) 126/46 (05/04 0839) SpO2:  [93 %-97 %] 93 % (05/04 0839) Last BM Date : 10/08/23  Intake/Output from previous day: 05/03 0701 - 05/04 0700 In: 1675.8 [P.O.:600; I.V.:475.8; IV Piggyback:600] Out: 230 [Urine:130; Stool:100] Intake/Output this shift: Total I/O In: 120 [P.O.:120] Out: -   Alert, well-appearing Unlabored respirations Abdomen is soft, appropriately mildly tender.  Incision clean, dry and intact with staples and honeycomb dressing.  Stoma is pink, everted and productive of thick liquid stool and gas.    Lab Results:  Recent Labs    10/09/23 0602 10/10/23 0426  WBC 11.1* 10.6*  HGB 8.0* 7.5*  HCT 26.0* 24.2*  PLT 382 349   BMET Recent Labs    10/09/23 0602 10/10/23 0426  NA 135 135  K 2.9* 3.6  CL 99 103  CO2 24 23  GLUCOSE 105* 103*  BUN 12 9  CREATININE 0.95 0.75  CALCIUM  8.1* 7.9*   PT/INR No results for input(s): "LABPROT", "INR" in the last 72 hours. ABG Recent Labs    10/08/23 0930  PHART 7.340*  HCO3 23.7    Studies/Results: No results found.  Anti-infectives: Anti-infectives (From admission, onward)    Start     Dose/Rate Route Frequency Ordered Stop   10/07/23 2030  cefTRIAXone  (ROCEPHIN ) 2 g in sodium chloride  0.9 % 100 mL IVPB        2 g 200 mL/hr over 30 Minutes Intravenous Every 24 hours 10/06/23 2116     10/07/23 0830  metroNIDAZOLE  (FLAGYL ) IVPB 500 mg        500 mg 100 mL/hr over 60 Minutes Intravenous Every 12 hours 10/06/23 2116     10/06/23 2030  cefTRIAXone  (ROCEPHIN ) 2 g in sodium chloride  0.9 % 100 mL IVPB        Placed in "And" Linked Group   2 g 200 mL/hr over 30 Minutes Intravenous  Once 10/06/23 2025 10/06/23 2201   10/06/23 2030  metroNIDAZOLE  (FLAGYL ) IVPB 500 mg       Placed in "And" Linked Group   500 mg 100 mL/hr over 60 Minutes Intravenous  Once 10/06/23 2025 10/06/23 2320       Assessment/Plan: Sigmoid stricture with pericolonic abscess, weight loss/malnutrition 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

## 2023-10-11 ENCOUNTER — Other Ambulatory Visit (HOSPITAL_COMMUNITY): Payer: Self-pay

## 2023-10-11 ENCOUNTER — Encounter (HOSPITAL_COMMUNITY): Payer: Self-pay | Admitting: Surgery

## 2023-10-11 DIAGNOSIS — N179 Acute kidney failure, unspecified: Secondary | ICD-10-CM | POA: Diagnosis not present

## 2023-10-11 DIAGNOSIS — K56699 Other intestinal obstruction unspecified as to partial versus complete obstruction: Secondary | ICD-10-CM | POA: Diagnosis not present

## 2023-10-11 DIAGNOSIS — Z933 Colostomy status: Secondary | ICD-10-CM

## 2023-10-11 DIAGNOSIS — M064 Inflammatory polyarthropathy: Secondary | ICD-10-CM | POA: Diagnosis not present

## 2023-10-11 DIAGNOSIS — K529 Noninfective gastroenteritis and colitis, unspecified: Secondary | ICD-10-CM | POA: Diagnosis not present

## 2023-10-11 DIAGNOSIS — Z9049 Acquired absence of other specified parts of digestive tract: Secondary | ICD-10-CM

## 2023-10-11 LAB — CULTURE, BLOOD (ROUTINE X 2)
Culture: NO GROWTH
Culture: NO GROWTH
Special Requests: ADEQUATE
Special Requests: ADEQUATE

## 2023-10-11 LAB — BASIC METABOLIC PANEL WITH GFR
Anion gap: 9 (ref 5–15)
BUN: 11 mg/dL (ref 8–23)
CO2: 24 mmol/L (ref 22–32)
Calcium: 8.3 mg/dL — ABNORMAL LOW (ref 8.9–10.3)
Chloride: 105 mmol/L (ref 98–111)
Creatinine, Ser: 0.94 mg/dL (ref 0.44–1.00)
GFR, Estimated: >60 mL/min (ref >60)
Glucose, Bld: 90 mg/dL (ref 70–99)
Potassium: 3.7 mmol/L (ref 3.5–5.1)
Sodium: 138 mmol/L (ref 135–145)

## 2023-10-11 LAB — CBC
HCT: 24.4 % — ABNORMAL LOW (ref 36.0–46.0)
Hemoglobin: 7.5 g/dL — ABNORMAL LOW (ref 12.0–15.0)
MCH: 28.5 pg (ref 26.0–34.0)
MCHC: 30.7 g/dL (ref 30.0–36.0)
MCV: 92.8 fL (ref 80.0–100.0)
Platelets: 358 10*3/uL (ref 150–400)
RBC: 2.63 MIL/uL — ABNORMAL LOW (ref 3.87–5.11)
RDW: 15.6 % — ABNORMAL HIGH (ref 11.5–15.5)
WBC: 8.7 10*3/uL (ref 4.0–10.5)
nRBC: 0 % (ref 0.0–0.2)

## 2023-10-11 LAB — SURGICAL PATHOLOGY

## 2023-10-11 LAB — MAGNESIUM: Magnesium: 1.9 mg/dL (ref 1.7–2.4)

## 2023-10-11 LAB — GLUCOSE, CAPILLARY: Glucose-Capillary: 151 mg/dL — ABNORMAL HIGH (ref 70–99)

## 2023-10-11 MED ORDER — ENSURE ENLIVE PO LIQD
237.0000 mL | Freq: Two times a day (BID) | ORAL | Status: DC
Start: 2023-10-11 — End: 2023-10-11
  Administered 2023-10-11: 237 mL via ORAL

## 2023-10-11 MED ORDER — AMOXICILLIN-POT CLAVULANATE 500-125 MG PO TABS
1.0000 | ORAL_TABLET | Freq: Two times a day (BID) | ORAL | Status: DC
  Filled 2023-10-11: qty 1

## 2023-10-11 MED ORDER — THIAMINE MONONITRATE 100 MG PO TABS
100.0000 mg | ORAL_TABLET | Freq: Every day | ORAL | Status: DC
  Administered 2023-10-11: 100 mg via ORAL
  Filled 2023-10-11: qty 1

## 2023-10-11 MED ORDER — OMEGA-3 FATTY ACIDS 1000 MG PO CAPS
1.0000 g | ORAL_CAPSULE | Freq: Every day | ORAL | Status: AC
Start: 2023-10-11 — End: ?

## 2023-10-11 MED ORDER — ENSURE ENLIVE PO LIQD: 237.0000 mL | Freq: Two times a day (BID) | ORAL | Status: AC

## 2023-10-11 MED ORDER — ADULT MULTIVITAMIN W/MINERALS CH
1.0000 | ORAL_TABLET | Freq: Every day | ORAL | Status: DC
  Administered 2023-10-11: 1 via ORAL
  Filled 2023-10-11: qty 1

## 2023-10-11 MED ORDER — AMOXICILLIN-POT CLAVULANATE 500-125 MG PO TABS
1.0000 | ORAL_TABLET | Freq: Two times a day (BID) | ORAL | 0 refills | Status: AC
  Filled 2023-10-11: qty 4, 2d supply, fill #0

## 2023-10-11 MED ORDER — ACETAMINOPHEN 325 MG PO TABS: 650.0000 mg | ORAL_TABLET | Freq: Four times a day (QID) | ORAL | Status: AC | PRN

## 2023-10-11 NOTE — Consult Note (Signed)
 WOC Nurse ostomy consult note Stoma type/location: LLQ, end colostomy Stomal assessment/size: 1 5/8 round, os at center, pink, moist, budded  Peristomal assessment: intact  Treatment options for stomal/peristomal skin: 2" barrier ring Output green stool  Ostomy pouching: 2pc. 2 3//4 with 2" barrier ring  Education provided:  Explained role of ostomy nurse and creation of stoma  Explained stoma characteristics (budded, flush, color, texture, care) Demonstrated pouch change (cutting new skin barrier, measuring stoma, cleaning peristomal skin and stoma, use of barrier ring) Education on emptying when 1/3 to 1/2 full and how to empty Demonstrated use of wick to clean spout  Provided patient with [employer] and marked items currently using Dicussed with team, need to learn to empty pouch prior to DC Ordered supplies for patient however I checked twice and they still were not on the unit despite telling unit secretary that patient was to be DC.  Will have HHRN, will need lots of support with ostomy care.   WOC Nurse will follow along with you for continued support with ostomy teaching and care Ikechukwu Cerny [facility] MSN, RN, [place], CNS, [place] [phone]       Enrolled patient in [facility] DC program: Yes

## 2023-10-11 NOTE — TOC Initial Note (Addendum)
 Transition of Care (TOC) - Initial/Assessment Note   Spoke to patient at bedside. Patient from home with grandson and his family.   WOC to see for colostomy teaching   Discussed home health RN, PT, OT . Patient in agreement. No preference .   Loetta Ringer with [facility] reviewing to see if she can accept referral. Loetta Ringer with [facility] cannot accept referral due to staffing   Moishe Angel with [facility] cannot accept referral    Randel Buss with [facility] cannot accept referral.   Shelvy Dickens with [facility] cannot accept referral due to staffing   Felicia with [facility] cannot accept referral due to no of service area.   Nikki with [facility] cannot accept referral , they are out of network with patient's insurance.   Lynette with [facility] unable to accept referral  Bartholomew Light with [facility] unable to accept referral    Amy with [facility] checking on staffing  . Updated Amy that [facility] accept referral    Kasie with [facility] cannot accept referral , insurance is out of network    [facility] [phone] , was told to fax referral to them at [phone] , they will review , however it takes up to 48 hours for determination. Referral faxed.  Received call back, they cannot accept referral due to staffing    Artavia with [facility] accepted referral   WOC nurse will provide patient with Catalog on how to order supplies and provide some supplies at discharge.   NCM let team know colostomy teaching needs to be complete prior to discharge. WOC arrange ostomy clinic appointment    Patient Details  Name: Vanessa Cisneros MRN: [id number] Date of Birth: Jan 15, 1946  Transition of Care [facility]) CM/SW Contact:    Terre Ferri, RN Phone Number: 10/11/2023, 10:20 AM  Clinical Narrative:                   Expected Discharge Plan: Home w Home Health Services Barriers to Discharge: Continued Medical Work up   Patient Goals and CMS  Choice Patient states their goals for this hospitalization and ongoing recovery are:: to return to home CMS Medicare.gov Compare Post Acute Care list provided to:: Patient Choice offered to / list presented to : Patient      Expected Discharge Plan and Services In-house Referral:  (WOC) Discharge Planning Services: CM Consult Post Acute Care Choice: Home Health, Durable Medical Equipment Living arrangements for the past 2 months: Single Family Home                 DME Arranged: Otho Blitz youth (youth rolling walker) DME Agency: [facility] Date DME Agency Contacted: 10/11/23 Time DME Agency Contacted: 1019 Representative spoke with at DME Agency: Raechel Bulla HH Arranged: RN, PT, OT HH Agency:  (see note)        Prior Living Arrangements/Services Living arrangements for the past 2 months: Single Family Home Lives with:: Adult Children (grandson and his family) Patient language and need for interpreter reviewed:: Yes Do you feel safe going back to the place where you live?: Yes      Need for Family Participation in Patient Care: Yes (Comment) Care giver support system in place?: Yes (comment)   Criminal Activity/Legal Involvement Pertinent to Current Situation/Hospitalization: No - Comment as needed  Activities of Daily Living   ADL Screening (condition at time of admission) Independently performs ADLs?: Yes (appropriate for developmental age) Is the patient deaf or  have difficulty hearing?: No Does the patient have difficulty seeing, even when wearing glasses/contacts?: No Does the patient have difficulty concentrating, remembering, or making decisions?: No  Permission Sought/Granted   Permission granted to share information with : Yes, Verbal Permission Granted     Permission granted to share info w AGENCY: [facility] and home health agencies        Emotional Assessment Appearance:: Appears stated age Attitude/Demeanor/Rapport: Engaged Affect (typically observed):  Appropriate Orientation: : Oriented to Self, Oriented to Place, Oriented to  Time, Oriented to Situation Alcohol / Substance Use: Not Applicable Psych Involvement: No (comment)  Admission diagnosis:  Generalized abdominal pain [R10.84] Colitis with abscess [K52.9, K63.0] Patient Active Problem List   Diagnosis Date Noted   Sigmoid stricture (HCC) 10/07/2023   Colitis with abscess 10/06/2023   Inflammatory polyarthropathy (HCC)    Depression 09/03/2023   Acute renal failure superimposed on stage 3a chronic kidney disease (HCC) 09/02/2023   Acute colitis 09/01/2023   Acute lower UTI 09/01/2023   Dyslipidemia 09/01/2023   GERD without esophagitis 09/01/2023   Hypokalemia 09/01/2023   Nausea vomiting and diarrhea 09/01/2023   PCP:  [name], MD Pharmacy:   [phone] [facility] [place] [place] - [address] [place] [address] Phone: [phone] Fax: [phone]     Social Drivers of Health (SDOH) Social History: SDOH Screenings   Food Insecurity: No Food Insecurity (10/06/2023)  Housing: Low Risk  (10/06/2023)  Transportation Needs: No Transportation Needs (10/06/2023)  Utilities: Not At Risk (10/06/2023)  Financial Resource Strain: Low Risk  (08/09/2023)   Received from [facility]  Social Connections: Moderately Isolated (10/06/2023)  Tobacco Use: Unknown (10/08/2023)   SDOH Interventions:     Readmission Risk Interventions     No data to display

## 2023-10-11 NOTE — Discharge Summary (Signed)
 Physician Discharge Summary  Vanessa Cisneros VWU:[id number] DOB: [birth year] DOA: 10/06/2023  PCP: [name], MD  Admit date: 10/06/2023 Discharge date: 10/11/23  Admitted From: Home Disposition: Home Recommendations for Outpatient Follow-up:  Outpatient follow-up with PCP and general surgery as below Check BP, CMP and CBC at follow-up Please follow up on the following pending results: Pathology from sigmoid colectomy  Home Health: PT/OT/RN Equipment/Devices: Rolling walker  Discharge Condition: Stable CODE STATUS: DNR  Follow-up Information     Adalberto Acton, MD. Go on 10/29/2023.   Specialty: General Surgery Why: surgical follow up. 5/23 at 1:50 pm, Please rrive 15 minutes early to complete check in, and bring photo ID and insurance card. Contact information: [address] [place] [address] [phone]         [facility], [place]. Go on 10/19/2023.   Specialty: General Surgery Why: nurse visit for staple removal. 5/13 at 10 am. Please arrive 30 minutes early to complete check in, and bring photo ID and insurance card. Contact information: [address] [place]  [address] [phone]        [place]  Follow up.   Contact information: [address] [place] [address] [phone]         [name], MD. Schedule an appointment as soon as possible for a visit in 1 week(s).   Specialty: Internal Medicine Contact information: [address] [facility] [place] [address] [phone]                 Hospital course 78 year old F with PMH of inflammatory arthritis, CKD-3A, lumbar stenosis, depression and recent hospitalization for acute colitis for which she was treated with antibiotics returning with abdominal pain, decreased appetite, weight loss, nausea and vomiting, and admitted with working  diagnosis of acute colitis with diverticulitis with abscess.    In ED, stable vitals.  No leukocytosis.  CT abdomen and pelvis demonstrated rectosigmoid wall thickening with 4.7 x 2.9 x 1.9 cm rim-enhancing lesion concerning for abscess.  General surgery consulted.  Patient was started on IV fluids, CTX and Flagyl  and admitted.   Patient underwent ex lap with partial sigmoid colectomy and end colostomy by Dr. Lanell Pinta on 5/2.  Continued on IV ceftriaxone  and Flagyl .   Patient progressed well postoperatively.  Tolerated regular diet.  Leukocytosis resolved.  Educated on colostomy care.  Cleared for discharge by surgery and therapy.  Discharged home with home PT/OT/RN and rolling walker.  She is discharged on p.o. Augmentin  for 2 more days for a total of 5 days postoperatively as recommended by surgery.  Outpatient follow-up with surgery and PCP as above.  See individual problem list below for more.   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 -Educated on ostomy care.  Also has ambulatory referral to wound care -IV ceftriaxone  and Flagyl  4/30>> Augmentin  5/5-5/7 - HH PT/OT/RN and rolling walker   AKI: B/l Cr ~1.0?.  Due to poor p.o. intake and concurrent use of diuretics.  Resolved.  CKD ruled out.  AKI resolved. Recent Labs    08/31/23 2144 09/01/23 0550 09/02/23 0419 09/03/23 0500 10/06/23 1812 10/07/23 0645 10/08/23 1159 10/09/23 0602 10/10/23 0426 10/11/23 0756  BUN 24* 21 16 14  25* 27* 18 12 9 11   CREATININE 1.23* 0.99 1.05* 1.07*  1.47* 1.33* 1.11* 0.95 0.75 0.94  - Recheck renal function in 1 week    Acute blood loss anemia superimposed on anemia of renal disease: Hgb dropped from 9.7-7.5 postoperatively and remained stable afterward.  Now symptomatic.  Some iron deficiency on anemia panel.  Received IV  Venofer 300 mg x 1 -Recheck CBC at follow-up     Inflammatory arthritis  -Hold Plaquenil while on antibiotics   Depression -Continue home Effexor    Hypokalemia   Underweight Body mass index is 17.17 kg/m.               Time spent 35 minutes  Vital signs Vitals:   10/10/23 2126 10/11/23 0428 10/11/23 0833 10/11/23 1143  BP: (!) 132/51 (!) 121/49 (!) 114/38 (!) 130/57  Pulse: 97 95 88 90  Temp: (!) 97.5 F (36.4 C) 97.9 F (36.6 C) 98.4 F (36.9 C) 98.6 F (37 C)  Resp: 17 16 16 17   Height:      Weight:      SpO2: 94% 91% 91% 93%  TempSrc:  Oral    BMI (Calculated):         Discharge exam   GENERAL: No apparent distress.  Nontoxic.  Sitting on bedside chair. HEENT: MMM.  Vision and hearing grossly intact.  NECK: Supple.  No apparent JVD.  RESP:  No IWOB.  Fair aeration bilaterally. CVS:  RRR. Heart sounds normal.  ABD/GI/GU: Abdomen soft.  Appropriately tender.  Dressing DCI.  LLQ colostomy with stool. MSK/EXT:  Moves extremities. No apparent deformity. No edema.  SKIN: no apparent skin lesion or wound NEURO: Awake, alert and oriented appropriately.  No apparent focal neuro deficit. PSYCH: Calm. Normal affect.   Discharge Instructions Discharge Instructions     Amb Referral to Ostomy Clinic   Complete by: As directed    Reason for referral modifiers: Pre and post-operative counseling for ostomy management   Diet - low sodium heart healthy   Complete by: As directed    Discharge instructions   Complete by: As directed    It has been a pleasure taking care of you!  You were hospitalized due to bowel infection with a stricture for which you had surgery and colostomy..  You have been started on antibiotics for infection.  We are discharging you on more antibiotics to complete treatment course.  Please review your new medication list and the directions on your medications before you take them.  Follow-up with your primary care doctor in 1 week.   Follow-up with surgery per the recommendation.   Take care,   Increase activity slowly   Complete by: As directed       Allergies as of 10/11/2023       Reactions   Levofloxacin Other (See Comments)   Patient stated that it makes her extremely sore, and she can hardly move.    Tramadol Other (See Comments)   Unknown         Medication List     PAUSE taking these medications    hydroxychloroquine 200 MG tablet Wait to take this until: Oct 13, 2023 Commonly known as: PLAQUENIL Take 200 mg by mouth daily.       TAKE these medications    acetaminophen  325 MG tablet Commonly known as: Tylenol  Take 2 tablets (650 mg total) by mouth every 6 (six) hours as needed for up to 5 days for mild pain (pain score 1-3), fever or headache.   amoxicillin -clavulanate 500-125 MG tablet Commonly known as: AUGMENTIN   Take 1 tablet by mouth 2 (two) times daily for 2 days.   CALCIUM  600 + D PO Take 1 tablet by mouth 2 (two) times daily.   donepezil  5 MG tablet Commonly known as: ARICEPT  Take 1 tablet by mouth at bedtime.   feeding supplement Liqd Take 237 mLs by mouth 2 (two) times daily between meals.   fish oil-omega-3 fatty acids 1000 MG capsule Take 1 capsule (1 g total) by mouth daily. What changed:  how much to take when to take this   fluticasone 50 MCG/ACT nasal spray Commonly known as: FLONASE Place 2 sprays into both nostrils daily as needed for allergies.   lovastatin 20 MG tablet Commonly known as: MEVACOR Take 20 mg by mouth at bedtime.   omeprazole 40 MG capsule Commonly known as: PRILOSEC Take 40 mg by mouth in the morning and at bedtime.   sucralfate 1 g tablet Commonly known as: CARAFATE Take 1 g by mouth 2 (two) times daily.   torsemide 20 MG tablet Commonly known as: DEMADEX Take 30 mg by mouth daily.   venlafaxine  XR 75 MG 24 hr capsule Commonly known as: EFFEXOR -XR Take 75 mg by mouth daily.               Durable Medical Equipment   (From admission, onward)           Start     Ordered   10/11/23 1024  For home use only DME Walker rolling  Once       Comments: Rolling walker (2 wheels) (youth RW)  Ht 4'11"  Wt 38.6 kg  Question Answer Comment  Walker: With 5 Inch Wheels   Patient needs a walker to treat with the following condition Weakness      10/11/23 1024            Consultations: General Surgery  Procedures/Studies: 5/2-ex lap with partial sigmoid colectomy and end colostomy   CT ABDOMEN PELVIS W WO CONTRAST Result Date: 10/06/2023 CLINICAL DATA:  Abdominal pain. Sigmoid colon stricture. Difficulty eating. Tender abdomen. Anemia. EXAM: CT ABDOMEN AND PELVIS WITHOUT AND WITH CONTRAST TECHNIQUE: Multidetector CT imaging of the abdomen and pelvis was performed following the standard protocol before and following the bolus administration of intravenous contrast. RADIATION DOSE REDUCTION: This exam was performed according to the departmental dose-optimization program which includes automated exposure control, adjustment of the mA and/or kV according to patient size and/or use of iterative reconstruction technique. CONTRAST:  75mL OMNIPAQUE  IOHEXOL  300 MG/ML  SOLN COMPARISON:  08/31/2023 FINDINGS: Lower chest: Very large hiatal hernia containing the majority of the stomach. Passive atelectasis in the left lower lobe. Descending thoracic aortic atherosclerosis. Hepatobiliary: Cholecystectomy.  Otherwise unremarkable. Pancreas: Unremarkable Spleen: Unremarkable Adrenals/Urinary Tract: Subcentimeter hypodense lesions in the kidneys are too small to characterize although statistically likely to be benign. No further imaging workup of these lesions is indicated. Adrenal glands unremarkable. Stomach/Bowel: The stomach is mainly intrathoracic and only partially included on today's CT of the abdomen. Prominent wall thickening of the sigmoid colon and rectum circumferentially and diffusely with scattered sigmoid colon  diverticula which do not appear significantly inflamed themselves. On image 54 of series 10 we demonstrate a rim enhancing 4.7 by 2.4 by 1.9 cm (volume = 11 cm^3) lesion nestled along the loops of sigmoid colon and rectum not containing internal oral contrast medium and accordingly raising suspicion for abscess. This was not definitively present on 08/31/2023 and accordingly tumor (which could appear identical to this) is considered  less likely. No internal gas within this structure. This sits along the posterior margin of vaginal cuff. The colon proximal to this inflamed rectosigmoid segment is moderately diffusely distended with cecal diameter at 7.6 cm in diameter and a representative segment of the transverse colon and 5.2 cm. However, the small bowel does not appear dilated. Vascular/Lymphatic: Atherosclerosis is present, including aortoiliac atherosclerotic disease. There is substantial narrowing of the proximal celiac trunk on image 49 series 11 due to atheromatous vascular calcification and median arcuate ligament. 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 Neither vessel appears completely occluded. No pathologic adenopathy observed. Reproductive: Uterus absent. Other: Trace ascites along the inferior right hepatic lobe margin as on image 28 series 7. Musculoskeletal: Mild dextroconvex lumbar scoliosis with rotary component. Lumbar spondylosis and degenerative disc disease with resulting mild to moderate multilevel impingement in the mid to lower lumbar spine. IMPRESSION: 1. Prominent wall thickening of the sigmoid colon and rectum circumferentially and diffusely with scattered sigmoid colon diverticula which do not appear significantly inflamed themselves. This is compatible with colitis/proctitis. 2. There is a rim enhancing 4.7 by 2.4 by 1.9 cm (volume = 11 cc) lesion along the posterior margin of the vaginal cuff, not containing internal oral  contrast medium and accordingly raising suspicion for abscess. This was not definitively present on 08/31/2023 and accordingly tumor (which could appear identical to this) is considered less likely. This presumed abscess is in close proximity to the rectosigmoid. 3. The colon proximal to this inflamed rectosigmoid segment is moderately diffusely distended with cecal diameter at 7.6 cm in diameter and a representative segment of the transverse colon at 5.2 cm. However, the small bowel does not appear dilated. 4. Very large hiatal hernia containing the majority of the stomach. 5. Trace ascites along the inferior right hepatic lobe margin. 6. Substantial narrowing of the proximal celiac trunk due to atheromatous vascular calcification and median arcuate ligament. 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 Aortic Atherosclerosis (ICD10-I70.0). 7. Mild dextroconvex lumbar scoliosis with rotary component. Lumbar spondylosis and degenerative disc disease with resulting mild to moderate multilevel impingement in the mid to lower lumbar spine. Electronically Signed   By: Freida Jes M.D.   On: 10/06/2023 14:36       The results of significant diagnostics from this hospitalization (including imaging, microbiology, ancillary and laboratory) are listed below for reference.     Microbiology: Recent Results (from the past 240 hours)  Blood culture (routine x 2)     Status: None   Collection Time: 10/06/23  7:07 PM   Specimen: BLOOD RIGHT ARM  Result Value Ref Range Status   Specimen Description BLOOD RIGHT ARM  Final   Special Requests   Final    BOTTLES DRAWN AEROBIC AND ANAEROBIC Blood Culture adequate volume   Culture   Final    NO GROWTH 5 DAYS Performed at [facility], [address] [place], [place] [address]    Report Status 10/11/2023 FINAL  Final  Blood culture (routine x 2)     Status: None   Collection Time: 10/06/23 10:55  PM   Specimen: BLOOD RIGHT HAND  Result Value Ref Range Status   Specimen Description BLOOD RIGHT HAND  Final   Special Requests   Final    BOTTLES DRAWN AEROBIC AND ANAEROBIC Blood Culture adequate volume   Culture   Final    NO GROWTH 5 DAYS Performed at [facility], [address] [place], [place] [address]    Report Status 10/11/2023 FINAL  Final  Surgical pcr screen     Status: None   Collection Time: 10/08/23  5:54 AM   Specimen: Nasal Mucosa; Nasal Swab  Result Value Ref Range Status   MRSA, PCR NEGATIVE NEGATIVE Final   Staphylococcus aureus NEGATIVE NEGATIVE Final    Comment: (NOTE) The Xpert SA Assay (FDA approved for NASAL specimens in patients 46 years of age and older), is one component of a comprehensive surveillance program. It is not intended to diagnose infection nor to guide or monitor treatment. Performed at [facility], [address], [place], [place] [address]      Labs:  CBC: Recent Labs  Lab 10/06/23 1812 10/07/23 0645 10/08/23 0930 10/08/23 1159 10/09/23 0602 10/10/23 0426 10/11/23 0756  WBC 9.6 11.9*  --  11.9* 11.1* 10.6* 8.7  NEUTROABS 8.1*  --   --   --   --   --   --   HGB 9.7* 9.0* 7.1* 7.9* 8.0* 7.5* 7.5*  HCT 32.0* 28.9* 21.0* 25.8* 26.0* 24.2* 24.4*  MCV 93.8 90.9  --  92.5 90.9 90.6 92.8  PLT 411* 395  --  369 382 349 358   BMP &GFR Recent Labs  Lab 10/07/23 0645 10/08/23 0930 10/08/23 1159 10/09/23 0602 10/10/23 0426 10/11/23 0756  NA 136 136 138 135 135 138  K 3.6 3.0* 3.5 2.9* 3.6 3.7  CL 93*  --  101 99 103 105  CO2 26  --  20* 24 23 24   GLUCOSE 103*  --  89 105* 103* 90  BUN 27*  --  18 12 9 11   CREATININE 1.33*  --  1.11* 0.95 0.75 0.94  CALCIUM  9.1  --  8.4* 8.1* 7.9* 8.3*  MG 2.4  --  1.8 1.7 2.2 1.9   Estimated Creatinine Clearance: 29.1 mL/min (by C-G formula based on SCr of 0.94 mg/dL). Liver & Pancreas: Recent Labs  Lab 10/06/23 1812  AST 20  ALT 13  ALKPHOS 49  BILITOT 0.5  PROT  6.4*  ALBUMIN 2.9*   No results for input(s): "LIPASE", "AMYLASE" in the last 168 hours. No results for input(s): "AMMONIA" in the last 168 hours. Diabetic: No results for input(s): "HGBA1C" in the last 72 hours. Recent Labs  Lab 10/11/23 1236  GLUCAP 151*   Cardiac Enzymes: No results for input(s): "CKTOTAL", "CKMB", "CKMBINDEX", "TROPONINI" in the last 168 hours. No results for input(s): "PROBNP" in the last 8760 hours. Coagulation Profile: No results for input(s): "INR", "PROTIME" in the last 168 hours. Thyroid  Function Tests: No results for input(s): "TSH", "T4TOTAL", "FREET4", "T3FREE", "THYROIDAB" in the last 72 hours. Lipid Profile: No results for input(s): "CHOL", "HDL", "LDLCALC", "TRIG", "CHOLHDL", "LDLDIRECT" in the last 72 hours. Anemia Panel: No results for input(s): "VITAMINB12", "FOLATE", "FERRITIN", "TIBC", "IRON", "RETICCTPCT" in the last 72 hours. Urine analysis:    Component Value Date/Time   COLORURINE YELLOW 10/06/2023 1812   APPEARANCEUR CLEAR 10/06/2023 1812   LABSPEC >1.046 (H) 10/06/2023 1812   PHURINE 6.0 10/06/2023 1812   GLUCOSEU NEGATIVE 10/06/2023 1812   HGBUR SMALL (A) 10/06/2023 1812   BILIRUBINUR NEGATIVE 10/06/2023 1812   KETONESUR 5 (A) 10/06/2023 1812   PROTEINUR 30 (A) 10/06/2023 1812   NITRITE NEGATIVE 10/06/2023 1812   LEUKOCYTESUR NEGATIVE 10/06/2023 1812   Sepsis Labs: Invalid input(s): "PROCALCITONIN", "LACTICIDVEN"   SIGNED:  Theadore Finger, MD  [facility] 10/11/2023, 6:33  PM

## 2023-10-11 NOTE — Anesthesia Postprocedure Evaluation (Signed)
 Anesthesia Post Note  Patient: Vanessa Cisneros  Procedure(s) Performed: LAPAROTOMY, EXPLORATORY COLECTOMY, SIGMOID, OPEN CREATION, COLOSTOMY     Patient location during evaluation: PACU Anesthesia Type: General Level of consciousness: awake Pain management: pain level controlled Vital Signs Assessment: post-procedure vital signs reviewed and stable Respiratory status: spontaneous breathing, nonlabored ventilation and respiratory function stable Cardiovascular status: blood pressure returned to baseline and stable Postop Assessment: no apparent nausea or vomiting Anesthetic complications: no   No notable events documented.  Last Vitals:  Vitals:   10/10/23 2126 10/11/23 0428  BP: (!) 132/51 (!) 121/49  Pulse: 97 95  Resp: 17 16  Temp: (!) 36.4 C 36.6 C  SpO2: 94% 91%    Last Pain:  Vitals:   10/11/23 0428  TempSrc: Oral  PainSc:                  Tommie Frame Iven Earnhart

## 2023-10-11 NOTE — Plan of Care (Signed)
 Discharge instructions discussed with patient.  Patient instructed on home medications, restrictions, and follow up appointments. Belongings gathered and sent with patient.  Patients medications received from [facility] pharmacy.

## 2023-10-11 NOTE — Care Management Important Message (Signed)
 Important Message  Patient Details  Name: Vanessa Cisneros MRN: [id number] Date of Birth: Apr 01, 1946   Important Message Given:  Yes - Medicare IM     Felix Host 10/11/2023, 4:06 PM

## 2023-10-11 NOTE — Progress Notes (Signed)
 PHARMACY NOTE:  ANTIMICROBIAL RENAL DOSAGE ADJUSTMENT  Current antimicrobial regimen includes a mismatch between antimicrobial dosage and estimated renal function.  As per policy approved by the Pharmacy & Therapeutics and Medical Executive Committees, the antimicrobial dosage will be adjusted accordingly.  Current antimicrobial dosage:  amox/clav 875-127 BID  Indication: diverticulitis with abscess  Renal Function:  Estimated Creatinine Clearance: 29.1 mL/min (by C-G formula based on SCr of 0.94 mg/dL).   Antimicrobial dosage has been changed to:  500-125mg  Q12hr     Thank you for allowing pharmacy to be a part of this patient's care.  Dorene Gang, PharmD, BCPS, BCCP Clinical Pharmacist  Please check AMION for all [facility] phone numbers After 10:00 PM, call Main Pharmacy [phone]

## 2023-10-11 NOTE — Evaluation (Signed)
 Occupational Therapy Evaluation Patient Details Name: Vanessa Cisneros MRN: [id number] DOB: [birth year] Today's Date: 10/11/2023   History of Present Illness   78 y.o. female presents to [facility] with abdominal pain and weight loss. Pt with acute colitis w/ diverticulitis w/ abscess and AKI. 5/2 ex-lap, partial sigmoid colectomy with end colostomy. PMHx: inflammatory arthritis, depression, CKD 3A, lumbar stenosis, anemia, CHF, GERD     Clinical Impressions Pt reports ind at baseline with ADL/functional mobility, lives with her grandson/family who can provide PRN assist. Pt needing set up /supervisoinf or ADLs, min A for bed mobility and CGA for transfers with RW. Pt able to bring knee to chest for LB ADL and utilizes log roll technique for bed mobility. Pt presenting with impairments listed below, will follow acutely. Anticipate no OT follow up needs at d/c.      If plan is discharge home, recommend the following:   A little help with walking and/or transfers;A little help with bathing/dressing/bathroom;Assistance with cooking/housework;Assist for transportation;Help with stairs or ramp for entrance     Functional Status Assessment   Patient has had a recent decline in their functional status and demonstrates the ability to make significant improvements in function in a reasonable and predictable amount of time.     Equipment Recommendations   None recommended by OT     Recommendations for Other Services   PT consult     Precautions/Restrictions   Precautions Precaution/Restrictions Comments: s/p ex-lap, colostomy Restrictions Weight Bearing Restrictions Per Provider Order: No     Mobility Bed Mobility Overal bed mobility: Needs Assistance           Sit to sidelying: Min assist General bed mobility comments: log roll for comfort    Transfers Overall transfer level: Needs assistance Equipment used: Rolling walker (2 wheels), Straight cane, None Transfers: Sit  to/from Stand Sit to Stand: Contact guard assist                  Balance Overall balance assessment: Mild deficits observed, not formally tested                                         ADL either performed or assessed with clinical judgement   ADL Overall ADL's : Needs assistance/impaired Eating/Feeding: Set up;Sitting   Grooming: Set up;Sitting   Upper Body Bathing: Sitting;Supervision/ safety   Lower Body Bathing: Sitting/lateral leans;Supervison/ safety   Upper Body Dressing : Supervision/safety;Sitting   Lower Body Dressing: Supervision/safety;Sit to/from stand;Sitting/lateral leans   Toilet Transfer: Supervision/safety;Ambulation;Rolling walker (2 wheels);Regular Toilet   Toileting- Clothing Manipulation and Hygiene: Supervision/safety       Functional mobility during ADLs: Supervision/safety;Rolling walker (2 wheels)       Vision   Vision Assessment?: No apparent visual deficits     Perception Perception: Not tested       Praxis Praxis: Not tested       Pertinent Vitals/Pain Pain Assessment Pain Assessment: Faces Pain Score: 4  Faces Pain Scale: Hurts little more Pain Location: abdomen with movement Pain Descriptors / Indicators: Aching, Discomfort Pain Intervention(s): Limited activity within patient's tolerance, Monitored during session, Repositioned     Extremity/Trunk Assessment Upper Extremity Assessment Upper Extremity Assessment: Overall WFL for tasks assessed   Lower Extremity Assessment Lower Extremity Assessment: Defer to PT evaluation   Cervical / Trunk Assessment Cervical / Trunk Assessment: Normal   Communication Communication Communication: No  apparent difficulties   Cognition Arousal: Alert Behavior During Therapy: WFL for tasks assessed/performed Cognition: No apparent impairments                               Following commands: Intact       Cueing  General Comments   Cueing  Techniques: Verbal cues  VSS   Exercises     Shoulder Instructions      Home Living Family/patient expects to be discharged to:: Private residence Living Arrangements: Other relatives (grandson and his wife) Available Help at Discharge: Family;Available PRN/intermittently Type of Home: House Home Access: Stairs to enter [employer] of Steps: 5 Entrance Stairs-Rails: Right Home Layout: One level     Bathroom Shower/Tub: Producer, television/film/video: Handicapped height Bathroom Accessibility: Yes   Home Equipment: Cane - single point;Shower seat          Prior Functioning/Environment Prior Level of Function : Independent/Modified Independent             Mobility Comments: Ind w/ no AD, would furniture surf ADLs Comments: Ind with ADLs and iADLs, family cooks and cleans    OT Problem List: Decreased strength;Decreased range of motion;Decreased activity tolerance;Impaired balance (sitting and/or standing)   OT Treatment/Interventions: Self-care/ADL training;Therapeutic exercise;Energy conservation;DME and/or AE instruction;Therapeutic activities;Balance training;Patient/family education      OT Goals(Current goals can be found in the care plan section)   Acute Rehab OT Goals Patient Stated Goal: none stated OT Goal Formulation: With patient Time For Goal Achievement: 10/25/23 Potential to Achieve Goals: Good ADL Goals Pt Will Perform Upper Body Dressing: with modified independence;sitting Pt Will Perform Lower Body Dressing: with modified independence Pt Will Transfer to Toilet: with modified independence;ambulating;regular height toilet Pt Will Perform Tub/Shower Transfer: Shower transfer;with modified independence;ambulating;shower seat Additional ADL Goal #1: pt will tolerate OOB activity x15 min in order to improve balance/activity tolerance for ADLs   OT Frequency:  Min 2X/week    Co-evaluation              AM-PAC OT "6 Clicks"  Daily Activity     Outcome Measure Help from another person eating meals?: None Help from another person taking care of personal grooming?: None Help from another person toileting, which includes using toliet, bedpan, or urinal?: A Little Help from another person bathing (including washing, rinsing, drying)?: A Little Help from another person to put on and taking off regular upper body clothing?: A Little Help from another person to put on and taking off regular lower body clothing?: A Little 6 Click Score: 20   End of Session Equipment Utilized During Treatment: Rolling walker (2 wheels) Nurse Communication: Mobility status  Activity Tolerance: Patient tolerated treatment well Patient left: in bed;with call bell/phone within reach;with bed alarm set  OT Visit Diagnosis: Unsteadiness on feet (R26.81);Other abnormalities of gait and mobility (R26.89);Muscle weakness (generalized) (M62.81)                Time: 7829-5621 OT Time Calculation (min): 18 min Charges:  OT General Charges $OT Visit: 1 Visit OT Evaluation $OT Eval Low Complexity: 1 Low  Dashaun Onstott K, OTD, OTR/L SecureChat Preferred Acute Rehab [phone]   Minahil Quinlivan K Koonce 10/11/2023, 9:15 AM

## 2023-10-11 NOTE — Progress Notes (Signed)
 3 Days Post-Op   Subjective/Chief Complaint: Pain well controlled. Still low appetite but improving and no abd pain/n/v with reg diet.  Objective: Vital signs in last 24 hours: Temp:  [97.5 F (36.4 C)-98.4 F (36.9 C)] 97.9 F (36.6 C) (05/05 0428) Pulse Rate:  [88-109] 95 (05/05 0428) Resp:  [16-17] 16 (05/05 0428) BP: (117-132)/(43-51) 121/49 (05/05 0428) SpO2:  [91 %-95 %] 91 % (05/05 0428) Last BM Date : 10/08/23  Intake/Output from previous day: 05/04 0701 - 05/05 0700 In: 780 [P.O.:480; IV Piggyback:300] Out: 550 [Stool:550] Intake/Output this shift: No intake/output data recorded.  Alert, well-appearing Unlabored respirations Abdomen is soft, appropriately mildly tender.  Incision clean, dry and intact with staples - honeycomb dressing removed.  Stoma is pink, everted and productive of thick liquid stool and gas.    Lab Results:  Recent Labs    10/09/23 0602 10/10/23 0426  WBC 11.1* 10.6*  HGB 8.0* 7.5*  HCT 26.0* 24.2*  PLT 382 349   BMET Recent Labs    10/09/23 0602 10/10/23 0426  NA 135 135  K 2.9* 3.6  CL 99 103  CO2 24 23  GLUCOSE 105* 103*  BUN 12 9  CREATININE 0.95 0.75  CALCIUM  8.1* 7.9*   PT/INR No results for input(s): "LABPROT", "INR" in the last 72 hours. ABG Recent Labs    10/08/23 0930  PHART 7.340*  HCO3 23.7    Studies/Results: No results found.  Anti-infectives: Anti-infectives (From admission, onward)    Start     Dose/Rate Route Frequency Ordered Stop   10/07/23 2030  cefTRIAXone  (ROCEPHIN ) 2 g in sodium chloride  0.9 % 100 mL IVPB        2 g 200 mL/hr over 30 Minutes Intravenous Every 24 hours 10/06/23 2116     10/07/23 0830  metroNIDAZOLE  (FLAGYL ) IVPB 500 mg        500 mg 100 mL/hr over 60 Minutes Intravenous Every 12 hours 10/06/23 2116     10/06/23 2030  cefTRIAXone  (ROCEPHIN ) 2 g in sodium chloride  0.9 % 100 mL IVPB       Placed in "And" Linked Group   2 g 200 mL/hr over 30 Minutes Intravenous  Once 10/06/23  2025 10/06/23 2201   10/06/23 2030  metroNIDAZOLE  (FLAGYL ) IVPB 500 mg       Placed in "And" Linked Group   500 mg 100 mL/hr over 60 Minutes Intravenous  Once 10/06/23 2025 10/06/23 2320       Assessment/Plan: Sigmoid stricture with pericolonic abscess, weight loss/malnutrition 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 Lanell Pinta 5/2  Tolerating reg diet mobilize as tolerated - PT/OT ordered Pulmonary toilet Multimodal pain control WOC to see today - discussed with them Path pending Leukocytosis resolved. Hgb low but stable at 7.5. will need abx for 5 days post op and can transition to PO augmentin  on dc  Will discuss with MD and likely surgically stable for dc today if she is able to work well with WOC. Will send pain meds. F/u arranged. Referral placed to [facility] clinic outpatient  FEN: reg ID: [email or web address] /[email or web address]   VTE: start lmwh today if remains inpatient   Elwin Hammond, [facility] Surgery 10/11/2023, 8:22 AM Please see Amion for pager number during day hours 7:00am-4:30pm

## 2023-10-16 ENCOUNTER — Other Ambulatory Visit: Payer: Self-pay

## 2023-10-16 ENCOUNTER — Encounter (HOSPITAL_COMMUNITY): Payer: Self-pay

## 2023-10-16 ENCOUNTER — Emergency Department
Admission: EM | Admit: 2023-10-16 | Discharge: 2023-10-17 | Disposition: A | Discharge status: Other Acute Inpatient Hospital | Attending: Emergency Medicine | Admitting: Emergency Medicine

## 2023-10-16 ENCOUNTER — Emergency Department

## 2023-10-16 ENCOUNTER — Inpatient Hospital Stay (HOSPITAL_COMMUNITY): Admit: 2023-10-16 | Admitting: Internal Medicine

## 2023-10-16 DIAGNOSIS — N183 Chronic kidney disease, stage 3 unspecified: Secondary | ICD-10-CM | POA: Insufficient documentation

## 2023-10-16 DIAGNOSIS — K651 Peritoneal abscess: Secondary | ICD-10-CM

## 2023-10-16 DIAGNOSIS — T8143XA Infection following a procedure, organ and space surgical site, initial encounter: Secondary | ICD-10-CM | POA: Diagnosis not present

## 2023-10-16 DIAGNOSIS — N179 Acute kidney failure, unspecified: Secondary | ICD-10-CM | POA: Diagnosis not present

## 2023-10-16 DIAGNOSIS — Y69 Unspecified misadventure during surgical and medical care: Secondary | ICD-10-CM | POA: Insufficient documentation

## 2023-10-16 DIAGNOSIS — E876 Hypokalemia: Secondary | ICD-10-CM | POA: Diagnosis not present

## 2023-10-16 DIAGNOSIS — M064 Inflammatory polyarthropathy: Secondary | ICD-10-CM

## 2023-10-16 DIAGNOSIS — R1030 Lower abdominal pain, unspecified: Secondary | ICD-10-CM | POA: Diagnosis present

## 2023-10-16 LAB — CBC
HCT: 26.9 % — ABNORMAL LOW (ref 36.0–46.0)
Hemoglobin: 8.2 g/dL — ABNORMAL LOW (ref 12.0–15.0)
MCH: 28.2 pg (ref 26.0–34.0)
MCHC: 30.5 g/dL (ref 30.0–36.0)
MCV: 92.4 fL (ref 80.0–100.0)
Platelets: 467 10*3/uL — ABNORMAL HIGH (ref 150–400)
RBC: 2.91 MIL/uL — ABNORMAL LOW (ref 3.87–5.11)
RDW: 18 % — ABNORMAL HIGH (ref 11.5–15.5)
WBC: 6.9 10*3/uL (ref 4.0–10.5)
nRBC: 0 % (ref 0.0–0.2)

## 2023-10-16 LAB — COMPREHENSIVE METABOLIC PANEL WITH GFR
ALT: 15 U/L (ref 0–44)
AST: 27 U/L (ref 15–41)
Albumin: 3.1 g/dL — ABNORMAL LOW (ref 3.5–5.0)
Alkaline Phosphatase: 47 U/L (ref 38–126)
Anion gap: 16 — ABNORMAL HIGH (ref 5–15)
BUN: 26 mg/dL — ABNORMAL HIGH (ref 8–23)
CO2: 28 mmol/L (ref 22–32)
Calcium: 9.6 mg/dL (ref 8.9–10.3)
Chloride: 93 mmol/L — ABNORMAL LOW (ref 98–111)
Creatinine, Ser: 1.57 mg/dL — ABNORMAL HIGH (ref 0.44–1.00)
GFR, Estimated: 33 mL/min — ABNORMAL LOW (ref >60)
Glucose, Bld: 134 mg/dL — ABNORMAL HIGH (ref 70–99)
Potassium: 2.8 mmol/L — ABNORMAL LOW (ref 3.5–5.1)
Sodium: 137 mmol/L (ref 135–145)
Total Bilirubin: 0.5 mg/dL (ref 0.0–1.2)
Total Protein: 7.1 g/dL (ref 6.5–8.1)

## 2023-10-16 LAB — URINALYSIS, ROUTINE W REFLEX MICROSCOPIC
Bilirubin Urine: NEGATIVE
Glucose, UA: NEGATIVE mg/dL
Hgb urine dipstick: NEGATIVE
Ketones, ur: NEGATIVE mg/dL
Leukocytes,Ua: NEGATIVE
Nitrite: NEGATIVE
Protein, ur: NEGATIVE mg/dL
Specific Gravity, Urine: 1.026 (ref 1.005–1.030)
pH: 8 (ref 5.0–8.0)

## 2023-10-16 LAB — LIPASE, BLOOD: Lipase: 63 U/L — ABNORMAL HIGH (ref 11–51)

## 2023-10-16 MED ORDER — POTASSIUM CHLORIDE 10 MEQ/100ML IV SOLN
10.0000 meq | Freq: Once | INTRAVENOUS | Status: AC
  Administered 2023-10-16: 10 meq via INTRAVENOUS
  Filled 2023-10-16: qty 100

## 2023-10-16 MED ORDER — PIPERACILLIN-TAZOBACTAM 3.375 G IVPB
3.3750 g | Freq: Once | INTRAVENOUS | Status: AC
  Administered 2023-10-16: 3.375 g via INTRAVENOUS
  Filled 2023-10-16: qty 50

## 2023-10-16 MED ORDER — LACTATED RINGERS IV BOLUS
1000.0000 mL | Freq: Once | INTRAVENOUS | Status: AC
  Administered 2023-10-16: 1000 mL via INTRAVENOUS

## 2023-10-16 MED ORDER — IOHEXOL 300 MG/ML  SOLN
75.0000 mL | Freq: Once | INTRAMUSCULAR | Status: AC | PRN
  Administered 2023-10-16: 75 mL via INTRAVENOUS

## 2023-10-16 MED ORDER — HYALURONIDASE HUMAN 150 UNIT/ML IJ SOLN
150.0000 [IU] | Freq: Once | INTRAMUSCULAR | Status: DC
  Filled 2023-10-16: qty 1

## 2023-10-16 NOTE — ED Notes (Signed)
 Existing IV infiltrated upon start of fluids. This RN attempted x2 to start another IV without success. IV team consulted for difficult stick.

## 2023-10-16 NOTE — ED Triage Notes (Signed)
 Patient complaining of lower abdominal pain; recent colectomy with ostomy on 10/08/23. Denies N/V.

## 2023-10-16 NOTE — ED Provider Notes (Signed)
 [facility] Provider Note    Event Date/Time   First MD Initiated Contact with Patient 10/16/23 1607     (approximate)   History   Abdominal Pain   HPI Shalaunda Subasic is a 78 y.o. female with history of CKD stage III, recent hospitalization for colitis, recent partial sigmoid colectomy and end colostomy on 5/2 presenting today for lower abdominal pain.  Discharged from the hospital 5 days ago for this prior issues.  States around 3 AM she had onset of lower abdominal pain.  Denies nausea, vomiting, chest pain, shortness of breath, dysuria.  Received Tylenol  before coming here and now states the pain is minimal.  Chart review: Reviewed notes from recent hospitalization and abdominal surgery.     Physical Exam   Triage Vital Signs: ED Triage Vitals  Encounter Vitals Group     BP 10/16/23 1556 107/65     Systolic BP Percentile --      Diastolic BP Percentile --      Pulse Rate 10/16/23 1556 70     Resp 10/16/23 1556 20     Temp 10/16/23 1556 98.5 F (36.9 C)     Temp Source 10/16/23 1556 Oral     SpO2 10/16/23 1557 98 %     Weight 10/16/23 1557 80 lb (36.3 kg)     Height 10/16/23 1557 4\' 11"  (1.499 m)     Head Circumference --      Peak Flow --      Pain Score 10/16/23 1558 9     Pain Loc --      Pain Education --      Exclude from Growth Chart --     Most recent vital signs: Vitals:   10/16/23 1900 10/16/23 2121  BP: (!) 112/46   Pulse: 84   Resp: (!) 23   Temp:  98.1 F (36.7 C)  SpO2: 100%    Physical Exam: I have reviewed the vital signs and nursing notes. General: Awake, alert, no acute distress.  Nontoxic appearing. Head:  Atraumatic, normocephalic.   ENT:  EOM intact, PERRL. Oral mucosa is pink and moist with no lesions. Neck: Neck is supple with full range of motion, No meningeal signs. Cardiovascular:  RRR, No murmurs. Peripheral pulses palpable and equal bilaterally. Respiratory:  Symmetrical chest wall expansion.  No  rhonchi, rales, or wheezes.  Good air movement throughout.  No use of accessory muscles.   Musculoskeletal:  No cyanosis or edema. Moving extremities with full ROM Abdomen:  Soft, tenderness palpation in the lower abdomen.  Colostomy site present which appears unremarkable with normal draining bile fluid, nondistended. Neuro:  GCS 15, moving all four extremities, interacting appropriately. Speech clear. Psych:  Calm, appropriate.   Skin:  Warm, dry, no rash.    ED Results / Procedures / Treatments   Labs (all labs ordered are listed, but only abnormal results are displayed) Labs Reviewed  LIPASE, BLOOD - Abnormal; Notable for the following components:      Result Value   Lipase 63 (*)    All other components within normal limits  COMPREHENSIVE METABOLIC PANEL WITH GFR - Abnormal; Notable for the following components:   Potassium 2.8 (*)    Chloride 93 (*)    Glucose, Bld 134 (*)    BUN 26 (*)    Creatinine, Ser 1.57 (*)    Albumin  3.1 (*)    GFR, Estimated 33 (*)    Anion gap 16 (*)  All other components within normal limits  CBC - Abnormal; Notable for the following components:   RBC 2.91 (*)    Hemoglobin 8.2 (*)    HCT 26.9 (*)    RDW 18.0 (*)    Platelets 467 (*)    All other components within normal limits  URINALYSIS, ROUTINE W REFLEX MICROSCOPIC - Abnormal; Notable for the following components:   Color, Urine STRAW (*)    APPearance CLEAR (*)    All other components within normal limits     EKG    RADIOLOGY Independently interpreted CT abdomen/pelvis showing enlarging abscess in the right lower quadrant   PROCEDURES:  Critical Care performed: No  Procedures   MEDICATIONS ORDERED IN ED: Medications  hyaluronidase Human (HYLENEX) injection 150 Units (150 Units Subcutaneous Not Given 10/16/23 1757)  piperacillin -tazobactam (ZOSYN ) IVPB 3.375 g (3.375 g Intravenous New Bag/Given 10/16/23 2120)  lactated ringers  bolus 1,000 mL (0 mLs Intravenous Stopped  10/16/23 2058)  potassium chloride  10 mEq in 100 mL IVPB (0 mEq Intravenous Stopped 10/16/23 1904)  iohexol  (OMNIPAQUE ) 300 MG/ML solution 75 mL (75 mLs Intravenous Contrast Given 10/16/23 1752)     IMPRESSION / MDM / ASSESSMENT AND PLAN / ED COURSE  I reviewed the triage vital signs and the nursing notes.                              Differential diagnosis includes, but is not limited to, postoperative infection, postoperative complication, appendicitis, colitis, enteritis, electrolyte abnormality  Patient's presentation is most consistent with acute presentation with potential threat to life or bodily function.  Patient is an 45-year-old female presenting today for lower abdominal pain in the setting of recent rectosigmoid abscess and requiring partial sigmoid colectomy with end colostomy.  Pain symptoms have improved although still significantly tender in the right lower quadrant.  Will get CT imaging for further evaluation.  Vital signs otherwise stable.  Laboratory workup most notable for elevated creatinine at 1.57 and hypokalemia.  Patient given 1 L of LR and IV potassium repletion.  Lipase mildly elevated.  Hemoglobin improved from prior baseline and no leukocytosis.  CT abdomen/pelvis shows enlarging abscess in the right lower quadrant concerning for worsening prior diverticular abscess versus postsurgical infection.  Initially called general surgery on-call here at [facility] who recommended we discussed the case with the previous surgeon at [facility].  Arlin Benes general surgery recommends transfer to [facility] for ongoing care.  Hospitalist at [facility] has agreed to accept for transfer.  Patient given Zosyn .  Signed out pending transfer.  The patient is on the cardiac monitor to evaluate for evidence of arrhythmia and/or significant heart rate changes. Clinical Course as of 10/16/23 2239  Sat Oct 16, 2023  2123 General Surgery at [facility] would like for patient to be transferred back  over for ongoing care of this worsening abscess. [DW]  2200 Dr. Michell Ahumada agrees to admission for PCU bed [DW]    Clinical Course User Index [DW] Karlynn Oyster Achilles Holes, MD     FINAL CLINICAL IMPRESSION(S) / ED DIAGNOSES   Final diagnoses:  Postprocedural intraabdominal abscess [facility])     Rx / DC Orders   ED Discharge Orders     None        Note:  This document was prepared using Dragon voice recognition software and may include unintentional dictation errors.   Kandee Orion, MD 10/16/23 2240

## 2023-10-16 NOTE — ED Notes (Signed)
 Pt stating no pain at this time, states was given tylenol  however no meds have been ordered as of yet.

## 2023-10-16 NOTE — ED Notes (Signed)
 IV team at bedside

## 2023-10-17 ENCOUNTER — Inpatient Hospital Stay (HOSPITAL_COMMUNITY)
Admission: EM | Admit: 2023-10-17 | Discharge: 2023-10-20 | DRG: 862 | Disposition: A | Discharge status: Home Health | Attending: Internal Medicine | Admitting: Internal Medicine

## 2023-10-17 ENCOUNTER — Encounter (HOSPITAL_COMMUNITY): Payer: Self-pay | Admitting: Family Medicine

## 2023-10-17 DIAGNOSIS — M48062 Spinal stenosis, lumbar region with neurogenic claudication: Secondary | ICD-10-CM | POA: Diagnosis present

## 2023-10-17 DIAGNOSIS — Z681 Body mass index (BMI) 19 or less, adult: Secondary | ICD-10-CM | POA: Diagnosis not present

## 2023-10-17 DIAGNOSIS — I509 Heart failure, unspecified: Secondary | ICD-10-CM | POA: Diagnosis present

## 2023-10-17 DIAGNOSIS — T8143XA Infection following a procedure, organ and space surgical site, initial encounter: Principal | ICD-10-CM | POA: Diagnosis present

## 2023-10-17 DIAGNOSIS — Z933 Colostomy status: Secondary | ICD-10-CM | POA: Diagnosis not present

## 2023-10-17 DIAGNOSIS — E782 Mixed hyperlipidemia: Secondary | ICD-10-CM | POA: Diagnosis present

## 2023-10-17 DIAGNOSIS — K651 Peritoneal abscess: Secondary | ICD-10-CM | POA: Diagnosis not present

## 2023-10-17 DIAGNOSIS — Z79899 Other long term (current) drug therapy: Secondary | ICD-10-CM

## 2023-10-17 DIAGNOSIS — Z9049 Acquired absence of other specified parts of digestive tract: Secondary | ICD-10-CM | POA: Diagnosis not present

## 2023-10-17 DIAGNOSIS — E876 Hypokalemia: Secondary | ICD-10-CM | POA: Diagnosis present

## 2023-10-17 DIAGNOSIS — E869 Volume depletion, unspecified: Secondary | ICD-10-CM | POA: Diagnosis present

## 2023-10-17 DIAGNOSIS — K529 Noninfective gastroenteritis and colitis, unspecified: Secondary | ICD-10-CM

## 2023-10-17 DIAGNOSIS — D500 Iron deficiency anemia secondary to blood loss (chronic): Secondary | ICD-10-CM | POA: Diagnosis present

## 2023-10-17 DIAGNOSIS — N1831 Chronic kidney disease, stage 3a: Secondary | ICD-10-CM | POA: Diagnosis present

## 2023-10-17 DIAGNOSIS — Z90710 Acquired absence of both cervix and uterus: Secondary | ICD-10-CM | POA: Diagnosis not present

## 2023-10-17 DIAGNOSIS — N179 Acute kidney failure, unspecified: Secondary | ICD-10-CM

## 2023-10-17 DIAGNOSIS — R636 Underweight: Secondary | ICD-10-CM | POA: Diagnosis present

## 2023-10-17 DIAGNOSIS — Y836 Removal of other organ (partial) (total) as the cause of abnormal reaction of the patient, or of later complication, without mention of misadventure at the time of the procedure: Secondary | ICD-10-CM | POA: Diagnosis present

## 2023-10-17 DIAGNOSIS — F32A Depression, unspecified: Secondary | ICD-10-CM | POA: Diagnosis present

## 2023-10-17 DIAGNOSIS — M064 Inflammatory polyarthropathy: Secondary | ICD-10-CM | POA: Diagnosis present

## 2023-10-17 DIAGNOSIS — D75839 Thrombocytosis, unspecified: Secondary | ICD-10-CM

## 2023-10-17 DIAGNOSIS — R739 Hyperglycemia, unspecified: Secondary | ICD-10-CM

## 2023-10-17 LAB — BASIC METABOLIC PANEL WITH GFR
Anion gap: 11 (ref 5–15)
Anion gap: 13 (ref 5–15)
BUN: 15 mg/dL (ref 8–23)
BUN: 9 mg/dL (ref 8–23)
CO2: 31 mmol/L (ref 22–32)
CO2: 23 mmol/L (ref 22–32)
Calcium: 8.6 mg/dL — ABNORMAL LOW (ref 8.9–10.3)
Calcium: 8.8 mg/dL — ABNORMAL LOW (ref 8.9–10.3)
Chloride: 95 mmol/L — ABNORMAL LOW (ref 98–111)
Chloride: 104 mmol/L (ref 98–111)
Creatinine, Ser: 1.31 mg/dL — ABNORMAL HIGH (ref 0.44–1.00)
Creatinine, Ser: 0.99 mg/dL (ref 0.44–1.00)
GFR, Estimated: 41 mL/min — ABNORMAL LOW (ref >60)
GFR, Estimated: 58 mL/min — ABNORMAL LOW (ref >60)
Glucose, Bld: 106 mg/dL — ABNORMAL HIGH (ref 70–99)
Glucose, Bld: 81 mg/dL (ref 70–99)
Potassium: 2.7 mmol/L — CL (ref 3.5–5.1)
Potassium: 3.8 mmol/L (ref 3.5–5.1)
Sodium: 137 mmol/L (ref 135–145)
Sodium: 140 mmol/L (ref 135–145)

## 2023-10-17 LAB — CBC
HCT: 22.6 % — ABNORMAL LOW (ref 36.0–46.0)
Hemoglobin: 6.9 g/dL — CL (ref 12.0–15.0)
MCH: 29.1 pg (ref 26.0–34.0)
MCHC: 30.5 g/dL (ref 30.0–36.0)
MCV: 95.4 fL (ref 80.0–100.0)
Platelets: 395 10*3/uL (ref 150–400)
RBC: 2.37 MIL/uL — ABNORMAL LOW (ref 3.87–5.11)
RDW: 18.4 % — ABNORMAL HIGH (ref 11.5–15.5)
WBC: 4.7 10*3/uL (ref 4.0–10.5)
nRBC: 0 % (ref 0.0–0.2)

## 2023-10-17 LAB — MAGNESIUM: Magnesium: 1.9 mg/dL (ref 1.7–2.4)

## 2023-10-17 LAB — PREPARE RBC (CROSSMATCH)

## 2023-10-17 MED ORDER — MAGNESIUM SULFATE IN D5W 1-5 GM/100ML-% IV SOLN
1.0000 g | Freq: Once | INTRAVENOUS | Status: AC
  Administered 2023-10-17: 1 g via INTRAVENOUS
  Filled 2023-10-17: qty 100

## 2023-10-17 MED ORDER — PANTOPRAZOLE SODIUM 40 MG IV SOLR
40.0000 mg | INTRAVENOUS | Status: DC
  Administered 2023-10-17 – 2023-10-20 (×4): 40 mg via INTRAVENOUS
  Filled 2023-10-17 (×4): qty 10

## 2023-10-17 MED ORDER — FENTANYL CITRATE PF 50 MCG/ML IJ SOSY: 12.5000 ug | PREFILLED_SYRINGE | INTRAMUSCULAR | Status: DC | PRN

## 2023-10-17 MED ORDER — POTASSIUM CHLORIDE 20 MEQ PO PACK
20.0000 meq | PACK | ORAL | Status: DC
  Administered 2023-10-17: 20 meq via ORAL
  Filled 2023-10-17: qty 1

## 2023-10-17 MED ORDER — OXYCODONE HCL 5 MG PO TABS: 2.5000 mg | ORAL_TABLET | ORAL | Status: DC | PRN

## 2023-10-17 MED ORDER — SODIUM CHLORIDE 0.9 % IV SOLN: INTRAVENOUS | Status: AC

## 2023-10-17 MED ORDER — ACETAMINOPHEN 325 MG PO TABS: 650.0000 mg | ORAL_TABLET | Freq: Four times a day (QID) | ORAL | Status: DC | PRN

## 2023-10-17 MED ORDER — LACTATED RINGERS IV SOLN
INTRAVENOUS | Status: DC
  Administered 2023-10-17: 75 mL via INTRAVENOUS

## 2023-10-17 MED ORDER — PROCHLORPERAZINE EDISYLATE 10 MG/2ML IJ SOLN: 5.0000 mg | Freq: Four times a day (QID) | INTRAMUSCULAR | Status: DC | PRN

## 2023-10-17 MED ORDER — PIPERACILLIN-TAZOBACTAM IN DEX 2-0.25 GM/50ML IV SOLN
2.2500 g | Freq: Three times a day (TID) | INTRAVENOUS | Status: DC
  Administered 2023-10-17 – 2023-10-18 (×4): 2.25 g via INTRAVENOUS
  Filled 2023-10-17 (×8): qty 50

## 2023-10-17 MED ORDER — POTASSIUM CHLORIDE 10 MEQ/100ML IV SOLN
10.0000 meq | INTRAVENOUS | Status: AC
  Administered 2023-10-17: 10 meq via INTRAVENOUS
  Filled 2023-10-17: qty 100

## 2023-10-17 MED ORDER — POTASSIUM CHLORIDE IN NACL 40-0.9 MEQ/L-% IV SOLN
INTRAVENOUS | Status: DC
  Filled 2023-10-17 (×3): qty 1000

## 2023-10-17 MED ORDER — ACETAMINOPHEN 650 MG RE SUPP: 650.0000 mg | Freq: Four times a day (QID) | RECTAL | Status: DC | PRN

## 2023-10-17 MED ORDER — MORPHINE SULFATE (PF) 2 MG/ML IV SOLN
1.0000 mg | INTRAVENOUS | Status: DC | PRN
  Administered 2023-10-18: 2 mg via INTRAVENOUS
  Administered 2023-10-18: 1 mg via INTRAVENOUS
  Administered 2023-10-18 – 2023-10-19 (×2): 2 mg via INTRAVENOUS
  Filled 2023-10-17 (×4): qty 1

## 2023-10-17 MED ORDER — POTASSIUM CHLORIDE 10 MEQ/100ML IV SOLN
10.0000 meq | INTRAVENOUS | Status: AC
  Administered 2023-10-17: 10 meq via INTRAVENOUS
  Filled 2023-10-17 (×2): qty 100

## 2023-10-17 MED ORDER — SODIUM CHLORIDE 0.9% IV SOLUTION: Freq: Once | INTRAVENOUS | Status: DC

## 2023-10-17 MED ORDER — SODIUM CHLORIDE 0.9% FLUSH
3.0000 mL | Freq: Two times a day (BID) | INTRAVENOUS | Status: DC
  Administered 2023-10-17 – 2023-10-20 (×5): 3 mL via INTRAVENOUS

## 2023-10-17 NOTE — Progress Notes (Addendum)
 Pt received from ED, V/S obtained, CCMD notified, patient is alert and oriented*4, no any complains, call bell in reach.   1600 patient had received only of potassium in ED, informed to the Dr Haywood Lisle, advised to repeat labs.  1700 potassium rechecked :3.8, MD advised to hold off potassium replacement until tomorrow.   10/17/23 1521  Vitals  Temp 98.7 F (37.1 C)  Temp Source Oral  BP (!) 113/52  MAP (mmHg) 70  BP Location Right Arm  BP Method Automatic  Patient Position (if appropriate) Sitting  Pulse Rate 82  Pulse Rate Source Monitor  ECG Heart Rate 82  Resp 16  Level of Consciousness  Level of Consciousness Alert  MEWS COLOR  MEWS Score Color Green  Oxygen Therapy  SpO2 98 %  O2 Device Room Air  Pain Assessment  Pain Scale 0-10  Pain Score 0  MEWS Score  MEWS Temp 0  MEWS Systolic 0  MEWS Pulse 0  MEWS RR 0  MEWS LOC 0  MEWS Score 0

## 2023-10-17 NOTE — ED Notes (Signed)
 Cold compress applied to affect arm of Vanessa Cisneros infiltration per ed pharmarcy.

## 2023-10-17 NOTE — ED Notes (Signed)
 [facility] departure @ [phone] from [facility] with pt

## 2023-10-17 NOTE — ED Notes (Signed)
 Iv running potassium found to have infiltrated MD paged.

## 2023-10-17 NOTE — Consult Note (Signed)
 Chief Complaint: Patient was seen in consultation today for pelvic fluid collection status post Hartmann colostomy on 5/2, with consideration for drainage.  Referring Provider(s): Dr. Harman Lightning, MD   Supervising Physician: Erica Hau  Patient Status: [facility] - ED  Patient is Full Code  History of Present Illness: Vanessa Cisneros is a 78 y.o. female  with PMHx notable for HLD, CHF, anemia, arthritis, GERD, lumbar stenosis with claudication, and depression.  Patient is status post Hartmann Colostomy on 5/2 by General Surgery, Dr. Lanell Pinta, secondary to acute colitis with diverticulitis with abscess. Patient presented to [facility] ED yesterday with worsening abdominal pain. She was transferred from [place] to [facility], as this was the site of her original surgery.  She maintains functional colostomy in place in the left lower quadrant. Dr. Dorrie Gaudier of General Surgery service was consulted, who recommended IR drainage. Of note, patient with severe hypokalemia at [facility], Hgb 6.9, WBC 4.7.   Interventional Radiology was requested for pelvic fluid collection drainage. Request was reviewed and approved by Dr. Nereida Banning. Patient is tentatively scheduled for same in IR tomorrow.   Patient is alert and laying in bed, calm.  Patient is currently without any significant complaints.  Patient denies any fevers, headache, chest pain, SOB, cough, abdominal pain, nausea, vomiting or bleeding.     Past Medical History:  Diagnosis Date   Anemia    Arthritis    CHF (congestive heart failure) (HCC)    Depression    Esophageal reflux    GERD (gastroesophageal reflux disease)    Hyperlipidemia, mixed    Immunodeficiency (cell-mediated) (HCC)    Inflammatory polyarthropathy (HCC)    Lumbar stenosis with neurogenic claudication     Past Surgical History:  Procedure Laterality Date   ABDOMINAL HYSTERECTOMY     BREAST BIOPSY Left 06/08/2012   neg    CHOLECYSTECTOMY     COLON RESECTION SIGMOID N/A 10/08/2023   Procedure: COLECTOMY, SIGMOID, OPEN;  Surgeon: Adalberto Acton, MD;  [facility]: [facility] OR;  Service: General;  Laterality: N/A;   COLONOSCOPY WITH PROPOFOL  N/A 07/21/2023   Procedure: COLONOSCOPY WITH PROPOFOL ;  Surgeon: Toledo, Alphonsus Jeans, MD;  Location: [facility] ENDOSCOPY;  Service: Gastroenterology;  Laterality: N/A;   COLOSTOMY N/A 10/08/2023   Procedure: CREATION, COLOSTOMY;  Surgeon: Adalberto Acton, MD;  Location: [facility] OR;  Service: General;  Laterality: N/A;   ESOPHAGOGASTRODUODENOSCOPY (EGD) WITH PROPOFOL  N/A 10/14/2015   Procedure: ESOPHAGOGASTRODUODENOSCOPY (EGD) WITH PROPOFOL ;  Surgeon: Cassie Click, MD;  Location: [facility] ENDOSCOPY;  Service: Endoscopy;  Laterality: N/A;   ESOPHAGOGASTRODUODENOSCOPY (EGD) WITH PROPOFOL  N/A 07/21/2023   Procedure: ESOPHAGOGASTRODUODENOSCOPY (EGD) WITH PROPOFOL ;  Surgeon: Toledo, Alphonsus Jeans, MD;  Location: [facility] ENDOSCOPY;  Service: Gastroenterology;  Laterality: N/A;   LAPAROTOMY N/A 10/08/2023   Procedure: LAPAROTOMY, EXPLORATORY;  Surgeon: Adalberto Acton, MD;  Location: [facility] OR;  Service: General;  Laterality: N/A;  [place] Intra-abdominal Abscess   TUMOR REMOVAL N/A    stomach    Allergies: Levofloxacin and Tramadol  Medications: Prior to Admission medications   Medication Sig Start Date End Date Taking? Authorizing Provider  Calcium  Carbonate-Vitamin D (CALCIUM  600 + D PO) Take 1 tablet by mouth 2 (two) times daily.     Yes [provider]  donepezil  (ARICEPT ) 5 MG tablet Take 1 tablet by mouth at bedtime. 07/19/23 07/18/24 Yes [provider]  feeding supplement (ENSURE ENLIVE / ENSURE PLUS) LIQD Take 237 mLs by mouth 2 (two) times daily between  meals. 10/11/23  Yes Theadore Finger, MD  fish oil-omega-3 fatty acids  1000 MG capsule Take 1 capsule (1 g total) by mouth daily. 10/11/23  Yes Gonfa, Taye T, MD  fluticasone (FLONASE) 50 MCG/ACT nasal spray Place 2 sprays into both nostrils  daily as needed for allergies. 07/07/23  Yes [provider]  hydroxychloroquine (PLAQUENIL) 200 MG tablet Take 200 mg by mouth daily.   Yes [provider]  lovastatin (MEVACOR) 20 MG tablet Take 20 mg by mouth at bedtime.     Yes [provider]  omeprazole (PRILOSEC) 40 MG capsule Take 40 mg by mouth in the morning and at bedtime.   Yes [provider]  sucralfate (CARAFATE) 1 g tablet Take 1 g by mouth 2 (two) times daily.   Yes [provider]  torsemide (DEMADEX) 20 MG tablet Take 30 mg by mouth daily. 09/04/23  Yes [provider]  venlafaxine  XR (EFFEXOR -XR) 75 MG 24 hr capsule Take 75 mg by mouth daily.   Yes [provider]     History reviewed. No pertinent family history.  Social History   Socioeconomic History   Marital status: Widowed    Spouse name: Not on file   Number of children: Not on file   Years of education: Not on file   Highest education level: Not on file  Occupational History   Not on file  Tobacco Use   Smoking status: Never   Smokeless tobacco: Not on file  Vaping Use   Vaping status: Never Used  Substance and Sexual Activity   Alcohol use: No   Drug use: Never   Sexual activity: Not on file  Other Topics Concern   Not on file  Social History Narrative   Not on file   Social Drivers of Health   Financial Resource Strain: Low Risk  (08/09/2023)   Received from [facility]   Overall Financial Resource Strain (CARDIA)    Difficulty of Paying Living Expenses: Not hard at all  Food Insecurity: No Food Insecurity (10/06/2023)   Hunger Vital Sign    Worried About Running Out of Food in the Last Year: Never true    Ran Out of Food in the Last Year: Never true  Transportation Needs: No Transportation Needs (10/06/2023)   PRAPARE - Administrator, Civil Service (Medical): No    Lack of Transportation (Non-Medical): No  Physical Activity: Not on file  Stress: Not  on file  Social Connections: Moderately Isolated (10/06/2023)   Social Connection and Isolation Panel [NHANES]    Frequency of Communication with Friends and Family: More than three times a week    Frequency of Social Gatherings with Friends and Family: Twice a week    Attends Religious Services: More than 4 times per year    Active Member of [employer] or Organizations: No    Attends Banker Meetings: Never    Marital Status: Widowed     Review of Systems: A 12 point ROS discussed and pertinent positives are indicated in the HPI above.  All other systems are negative.  Vital Signs: BP 131/62   Pulse 86   Temp 98.5 F (36.9 C)   Resp (!) 22   SpO2 99%   Advance Care Plan: The advanced care place/surrogate decision maker was discussed at the time of visit and the patient did not wish to discuss or was not able to name a surrogate decision maker or provide an advance  care plan.  Physical Exam Constitutional:      General: She is not in acute distress.    Appearance: Normal appearance.  HENT:     Head: Normocephalic and atraumatic.     Mouth/Throat:     Mouth: Mucous membranes are dry.  Cardiovascular:     Rate and Rhythm: Normal rate and regular rhythm.     Pulses: Normal pulses.     Heart sounds: Murmur heard.  Pulmonary:     Effort: Pulmonary effort is normal.     Breath sounds: Normal breath sounds. No wheezing.  Musculoskeletal:        General: Normal range of motion.     Cervical back: Normal range of motion.  Skin:    General: Skin is warm and dry.  Neurological:     Mental Status: She is alert and oriented to person, place, and time.  Psychiatric:        Mood and Affect: Mood normal.        Behavior: Behavior normal.        Thought Content: Thought content normal.        Judgment: Judgment normal.     Imaging: CT ABDOMEN PELVIS W CONTRAST Result Date: 10/16/2023 CLINICAL DATA:  Right lower abdominal pain. Recent abdominal surgery with colostomy.  EXAM: CT ABDOMEN AND PELVIS WITH CONTRAST TECHNIQUE: Multidetector CT imaging of the abdomen and pelvis was performed using the standard protocol following bolus administration of intravenous contrast. RADIATION DOSE REDUCTION: This exam was performed according to the departmental dose-optimization program which includes automated exposure control, adjustment of the mA and/or kV according to patient size and/or use of iterative reconstruction technique. CONTRAST:  75mL OMNIPAQUE  IOHEXOL  300 MG/ML  SOLN COMPARISON:  Most recent CT 10/06/2023 FINDINGS: Lower chest: Large hiatal hernia. No basilar airspace disease or pleural effusion. Hepatobiliary: Unremarkable appearance of the liver. Cholecystectomy without biliary dilatation. Pancreas: No ductal dilatation or inflammation. Spleen: Normal in size without focal abnormality. Adrenals/Urinary Tract: Normal adrenal glands. No hydronephrosis or renal calculi. No renal inflammation. Small hypodense renal lesions are likely cysts, no specific imaging follow-up is recommended. Unremarkable urinary bladder. Stomach/Bowel: Bowel assessment is limited in the absence of enteric contrast and paucity of intra-abdominal fat. Large hiatal hernia with the entire stomach being intrathoracic, no change from prior exam. Descending colostomy. Stapled off sigmoid colon in the pelvis. Improved wall thickening of the sigmoid colon and rectum. The remainder of the colon is decompressed and not well assessed. No abnormal colonic distension no small bowel distension or evidence of obstruction/ileus. There is mild small bowel wall thickening involving pelvic bowel loops. Vascular/Lymphatic: Aortic atherosclerosis. No aneurysm. The portal vein is patent. No bulky abdominopelvic adenopathy. Reproductive: Status post hysterectomy. No adnexal masses. Other: Fluid collection within the deep pelvis posterior to the right of midline measures 7.7 x 3.4 x 4 cm, series 2, image 51. This has shifted in  location from prior exam where a fluid collection to the left of midline measured 4.7 x 2.4 x 1.9 cm. There is mild peripheral enhancement. No internal gas or enteric contrast. No surrounding fat stranding. Midline skin staples in place. No free intra-abdominal air. Musculoskeletal: Chronic scoliosis and degenerative change. There are no acute or suspicious osseous abnormalities. IMPRESSION: 1. Fluid collection within the deep pelvis posterior to the right of midline measures 7.7 x 3.4 x 4 cm. This has shifted in location from prior exam orbits to the left of midline, and increase in size. Findings are suspicious for abscess.  2. Descending colostomy. Stapled off sigmoid colon in the pelvis. Improved wall thickening of the sigmoid colon and rectum. 3. Mild small bowel wall thickening involving pelvic bowel loops, favored to be reactive adjacent to pelvic collection. 4. Large hiatal hernia with the entire stomach being intrathoracic, no change from prior exam. Aortic Atherosclerosis (ICD10-I70.0). Electronically Signed   By: Chadwick Colonel M.D.   On: 10/16/2023 20:46   CT ABDOMEN PELVIS W WO CONTRAST Result Date: 10/06/2023 CLINICAL DATA:  Abdominal pain. Sigmoid colon stricture. Difficulty eating. Tender abdomen. Anemia. EXAM: CT ABDOMEN AND PELVIS WITHOUT AND WITH CONTRAST TECHNIQUE: Multidetector CT imaging of the abdomen and pelvis was performed following the standard protocol before and following the bolus administration of intravenous contrast. RADIATION DOSE REDUCTION: This exam was performed according to the departmental dose-optimization program which includes automated exposure control, adjustment of the mA and/or kV according to patient size and/or use of iterative reconstruction technique. CONTRAST:  75mL OMNIPAQUE  IOHEXOL  300 MG/ML  SOLN COMPARISON:  08/31/2023 FINDINGS: Lower chest: Very large hiatal hernia containing the majority of the stomach. Passive atelectasis in the left lower lobe.  Descending thoracic aortic atherosclerosis. Hepatobiliary: Cholecystectomy.  Otherwise unremarkable. Pancreas: Unremarkable Spleen: Unremarkable Adrenals/Urinary Tract: Subcentimeter hypodense lesions in the kidneys are too small to characterize although statistically likely to be benign. No further imaging workup of these lesions is indicated. Adrenal glands unremarkable. Stomach/Bowel: The stomach is mainly intrathoracic and only partially included on today's CT of the abdomen. Prominent wall thickening of the sigmoid colon and rectum circumferentially and diffusely with scattered sigmoid colon diverticula which do not appear significantly inflamed themselves. On image 54 of series 10 we demonstrate a rim enhancing 4.7 by 2.4 by 1.9 cm (volume = 11 cm^3) lesion nestled along the loops of sigmoid colon and rectum not containing internal oral contrast medium and accordingly raising suspicion for abscess. This was not definitively present on 08/31/2023 and accordingly tumor (which could appear identical to this) is considered less likely. No internal gas within this structure. This sits along the posterior margin of vaginal cuff. The colon proximal to this inflamed rectosigmoid segment is moderately diffusely distended with cecal diameter at 7.6 cm in diameter and a representative segment of the transverse colon and 5.2 cm. However, the small bowel does not appear dilated. Vascular/Lymphatic: Atherosclerosis is present, including aortoiliac atherosclerotic disease. There is substantial narrowing of the proximal celiac trunk on image 49 series 11 due to atheromatous vascular calcification and median arcuate ligament. 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 Neither vessel appears completely occluded. No pathologic adenopathy observed. Reproductive: Uterus absent. Other: Trace ascites along the inferior right hepatic lobe margin as on image 28 series 7.  Musculoskeletal: Mild dextroconvex lumbar scoliosis with rotary component. Lumbar spondylosis and degenerative disc disease with resulting mild to moderate multilevel impingement in the mid to lower lumbar spine. IMPRESSION: 1. Prominent wall thickening of the sigmoid colon and rectum circumferentially and diffusely with scattered sigmoid colon diverticula which do not appear significantly inflamed themselves. This is compatible with colitis/proctitis. 2. There is a rim enhancing 4.7 by 2.4 by 1.9 cm (volume = 11 cc) lesion along the posterior margin of the vaginal cuff, not containing internal oral contrast medium and accordingly raising suspicion for abscess. This was not definitively present on 08/31/2023 and accordingly tumor (which could appear identical to this) is considered less likely. This presumed abscess is in close proximity to the rectosigmoid. 3. The colon proximal to this inflamed  rectosigmoid segment is moderately diffusely distended with cecal diameter at 7.6 cm in diameter and a representative segment of the transverse colon at 5.2 cm. However, the small bowel does not appear dilated. 4. Very large hiatal hernia containing the majority of the stomach. 5. Trace ascites along the inferior right hepatic lobe margin. 6. Substantial narrowing of the proximal celiac trunk due to atheromatous vascular calcification and median arcuate ligament. 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 Aortic Atherosclerosis (ICD10-I70.0). 7. Mild dextroconvex lumbar scoliosis with rotary component. Lumbar spondylosis and degenerative disc disease with resulting mild to moderate multilevel impingement in the mid to lower lumbar spine. Electronically Signed   By: Freida Jes M.D.   On: 10/06/2023 14:36    Labs:  CBC: Recent Labs    10/10/23 0426 10/11/23 0756 10/16/23 1559 10/17/23 0557  WBC 10.6* 8.7 6.9 4.7  HGB 7.5* 7.5* 8.2* 6.9*  HCT 24.2*  24.4* 26.9* 22.6*  PLT 349 358 467* 395    COAGS: Recent Labs    09/01/23 0550  INR 1.1    BMP: Recent Labs    10/10/23 0426 10/11/23 0756 10/16/23 1559 10/17/23 0338  NA 135 138 137 137  K 3.6 3.7 2.8* 2.7*  CL 103 105 93* 95*  CO2 23 24 28 31   GLUCOSE 103* 90 134* 106*  BUN 9 11 26* 15  CALCIUM  7.9* 8.3* 9.6 8.6*  CREATININE 0.75 0.94 1.57* 1.31*  GFRNONAA >60 >60 33* 41*    LIVER FUNCTION TESTS: Recent Labs    08/31/23 2144 10/06/23 1812 10/16/23 1559  BILITOT 0.5 0.5 0.5  AST 24 20 27   ALT 17 13 15   ALKPHOS 43 49 47  PROT 7.2 6.4* 7.1  ALBUMIN  3.9 2.9* 3.1*    TUMOR MARKERS: No results for input(s): "AFPTM", "CEA", "CA199", "CHROMGRNA" in the last 8760 hours.  Assessment and Plan: Patient is status post Hartmann Colostomy on 5/2 by General Surgery, Dr. Lanell Pinta, secondary to acute colitis with diverticulitis with abscess. Patient presented to [facility] ED yesterday with worsening abdominal pain. She was transferred from [place] to [facility], as this was the site of her original surgery.  She maintains functional colostomy in place in the left lower quadrant. Dr. Dorrie Gaudier of General Surgery service was consulted, who recommended IR drainage.   WBC 4.7, Hgb 6.9, INR 1.1, K 2.7.  Plan for 1 unit PRBC transfusion today per care team, and trend H&H.  No pertinent allergies. Patient will have been NPO at midnight.  Patient will present for tentatively scheduled pelvic fluid collection drainage in IR tomorrow.   Risks and benefits discussed with the patient including bleeding, infection, damage to adjacent structures, bowel perforation/fistula connection, and sepsis.  All of the patient's questions were answered, patient is agreeable to proceed.  Consent signed and in chart.      Thank you for allowing our service to participate in Vanessa Cisneros 's care.  Electronically Signed: Lovena Rubinstein, PA-C   10/17/2023,  12:46 PM      I spent a total of 40 Minutes in face to face in clinical consultation, greater than 50% of which was counseling/coordinating care for pelvic fluid collection status post Hartmann colostomy on 5/2, with consideration for drainage.

## 2023-10-17 NOTE — ED Notes (Signed)
 PT arrived via EMS VSS PT alert and talking. PT states no pain and states she is doing good. PT states she has no needs at this time.

## 2023-10-17 NOTE — Progress Notes (Signed)
 Hemoglobin has dropped from 8.2 on admission yesterday to 6.9 on morning labs today.  No overt bleeding.  Hemodynamically stable.  Type and screen, 1 unit PRBCs ordered after obtaining consent from the patient.  Follow-up posttransfusion H&H and continue to transfuse if hemoglobin less than 7.

## 2023-10-17 NOTE — ED Provider Notes (Signed)
 Patient transferred from [facility], patient with worsening intra-abdominal abscess transferred here because this was the site of her original surgery.  She is resting comfortably, no abdominal tenderness.  Colostomy in place in the left lower quadrant.  I have discussed the case with Dr. Brice Campi of [facility] hospitalist, who agrees to admit the patient.  Have also discussed case with Dr. Dorrie Gaudier of general surgery service who agrees to see the patient in consultation.  Of note, I noted that she had severe hypokalemia at [facility] and had only received 1 intravenous run of potassium chloride , and also had an acute kidney injury.  I have ordered a repeat basic metabolic panel.   Alissa April, MD 10/17/23 [phone]

## 2023-10-17 NOTE — ED Notes (Signed)
 Pt arrived for a surgical consult

## 2023-10-17 NOTE — Consult Note (Signed)
 Reason for Consult: abscess concern Referring Provider: Daisy Du  Vanessa Cisneros is an 78 y.o. female.  HPI: 78 yo female 8 days out from hartman colectomy was discharged 5 days ago. She went to the ER because she was feeling worse abdominal pain. Ostomy has been working fine.  Past Medical History:  Diagnosis Date   Anemia    Arthritis    CHF (congestive heart failure) (HCC)    Depression    Esophageal reflux    GERD (gastroesophageal reflux disease)    Hyperlipidemia, mixed    Immunodeficiency (cell-mediated) (HCC)    Inflammatory polyarthropathy (HCC)    Lumbar stenosis with neurogenic claudication     Past Surgical History:  Procedure Laterality Date   ABDOMINAL HYSTERECTOMY     BREAST BIOPSY Left 06/08/2012   neg   CHOLECYSTECTOMY     COLON RESECTION SIGMOID N/A 10/08/2023   Procedure: COLECTOMY, SIGMOID, OPEN;  Surgeon: Adalberto Acton, MD;  Location: [facility] OR;  Service: General;  Laterality: N/A;   COLONOSCOPY WITH PROPOFOL  N/A 07/21/2023   Procedure: COLONOSCOPY WITH PROPOFOL ;  Surgeon: Toledo, Alphonsus Jeans, MD;  Location: [facility] ENDOSCOPY;  Service: Gastroenterology;  Laterality: N/A;   COLOSTOMY N/A 10/08/2023   Procedure: CREATION, COLOSTOMY;  Surgeon: Adalberto Acton, MD;  Location: [facility] OR;  Service: General;  Laterality: N/A;   ESOPHAGOGASTRODUODENOSCOPY (EGD) WITH PROPOFOL  N/A 10/14/2015   Procedure: ESOPHAGOGASTRODUODENOSCOPY (EGD) WITH PROPOFOL ;  Surgeon: Cassie Click, MD;  Location: [facility] ENDOSCOPY;  Service: Endoscopy;  Laterality: N/A;   ESOPHAGOGASTRODUODENOSCOPY (EGD) WITH PROPOFOL  N/A 07/21/2023   Procedure: ESOPHAGOGASTRODUODENOSCOPY (EGD) WITH PROPOFOL ;  Surgeon: Toledo, Alphonsus Jeans, MD;  Location: [facility] ENDOSCOPY;  Service: Gastroenterology;  Laterality: N/A;   LAPAROTOMY N/A 10/08/2023   Procedure: LAPAROTOMY, EXPLORATORY;  Surgeon: Adalberto Acton, MD;  Location: [facility] OR;  Service: General;  Laterality: N/A;  [place] Intra-abdominal Abscess   TUMOR  REMOVAL N/A    stomach    History reviewed. No pertinent family history.  Social History:  reports that she has never smoked. She does not have any smokeless tobacco history on file. She reports that she does not drink alcohol and does not use drugs.  Allergies:  Allergies  Allergen Reactions   Levofloxacin Other (See Comments)    Patient stated that it makes her extremely sore, and she can hardly move.    Tramadol Nausea Only    Medications: I have reviewed the patient's current medications.  Results for orders placed or performed during the hospital encounter of 10/17/23 (from the past 48 hours)  Basic metabolic panel     Status: Abnormal   Collection Time: 10/17/23  3:38 AM  Result Value Ref Range   Sodium 137 135 - 145 mmol/L   Potassium 2.7 (LL) 3.5 - 5.1 mmol/L    Comment: CRITICAL RESULT CALLED TO, READ BACK BY AND VERIFIED WITH L. CARDIERI RN @0448  10/17/2023 S. BYRD   Chloride 95 (L) 98 - 111 mmol/L   CO2 31 22 - 32 mmol/L   Glucose, Bld 106 (H) 70 - 99 mg/dL    Comment: Glucose reference range applies only to samples taken after fasting for at least 8 hours.   BUN 15 8 - 23 mg/dL   Creatinine, Ser 1.61 (H) 0.44 - 1.00 mg/dL   Calcium  8.6 (L) 8.9 - 10.3 mg/dL   GFR, Estimated 41 (L) >60 mL/min    Comment: (NOTE) Calculated using the CKD-EPI Creatinine Equation (2021)    Anion gap 11 5 -  15    Comment: Performed at [facility], [address] [place], [place] [address]    PE Blood pressure (!) 112/48, pulse 87, temperature 98.6 F (37 C), resp. rate 20, SpO2 99%. Constitutional: NAD; conversant; no deformities Eyes: Moist conjunctiva; no lid lag; anicteric; PERRL Neck: Trachea midline; no thyromegaly Lungs: Normal respiratory effort; no tactile fremitus CV: RRR; no palpable thrills; no pitting edema GI: Abd soft, NT, left side ostomy pink and functional, incision intact without erythema; no palpable hepatosplenomegaly MSK: unable to assess gait; no  clubbing/cyanosis Psychiatric: Appropriate affect; alert and oriented x3 Lymphatic: No palpable cervical or axillary lymphadenopathy Skin: No major subcutaneous nodules. Warm and dry   Assessment/Plan: 78 yo female s/p hartman's colectomy with new wall enhancing fluid collection in posterior pelvis. -IV abx -IR consult for possible drain -NPO for poss procedure -pain control  I reviewed last 24 h vitals and pain scores, last 48 h intake and output, last 24 h labs and trends, and last 24 h imaging results.  This care required high  level of medical decision making.   Alphonso Aschoff Meganne Rita 10/17/2023, 4:57 AM

## 2023-10-17 NOTE — H&P (Signed)
 History and Physical    Vanessa Cisneros WUJ:[id number] DOB: Feb 17, 1946 DOA: 10/17/2023  PCP: [name], MD   Patient coming from: Home   Chief Complaint: abdominal pain   HPI: Vanessa Cisneros is a 78 y.o. female with medical history significant for inflammatory arthritis, depression, lumbar spinal stenosis, large hiatal hernia, GERD, and recent admission for diverticulitis with abscess complicated by sigmoid stricture and obstruction status post exploratory laparotomy with partial sigmoid colectomy with end colostomy on 10/08/2023, now returning with lower abdominal pain.  Patient reports that she had been recovering well back at home until developing lower abdominal pain approximately 24 hours ago.  She reported taking Tylenol  with significant improvement in her pain prior to arrival in the ED. she denies fevers, chills, or vomiting.   [facility] ED Course: Upon arrival to the ED, patient is found to be afebrile and saturating well on room air with normal HR and SBP 90s and greater.  Labs are most notable for potassium 2.8, BUN 26, creatinine 1.57, hemoglobin 8.2, and platelets 467,000.  CT demonstrates increased fluid collection in the deep pelvis concerning for abscess.  Surgery (Dr. Dorrie Gaudier) was consulted by the ED physician, patient was given a liter of LR, 10 mill equivalents IV potassium, and Zosyn , and she was transferred to [facility] for admission.  Review of Systems:  All other systems reviewed and apart from HPI, are negative.  Past Medical History:  Diagnosis Date   Anemia    Arthritis    CHF (congestive heart failure) (HCC)    Depression    Esophageal reflux    GERD (gastroesophageal reflux disease)    Hyperlipidemia, mixed    Immunodeficiency (cell-mediated) (HCC)    Inflammatory polyarthropathy (HCC)    Lumbar stenosis with neurogenic claudication     Past Surgical History:  Procedure Laterality Date   ABDOMINAL HYSTERECTOMY     BREAST BIOPSY Left  06/08/2012   neg   CHOLECYSTECTOMY     COLON RESECTION SIGMOID N/A 10/08/2023   Procedure: COLECTOMY, SIGMOID, OPEN;  Surgeon: Adalberto Acton, MD;  Location: [facility] OR;  Service: General;  Laterality: N/A;   COLONOSCOPY WITH PROPOFOL  N/A 07/21/2023   Procedure: COLONOSCOPY WITH PROPOFOL ;  Surgeon: Toledo, Alphonsus Jeans, MD;  Location: [facility] ENDOSCOPY;  Service: Gastroenterology;  Laterality: N/A;   COLOSTOMY N/A 10/08/2023   Procedure: CREATION, COLOSTOMY;  Surgeon: Adalberto Acton, MD;  Location: [facility] OR;  Service: General;  Laterality: N/A;   ESOPHAGOGASTRODUODENOSCOPY (EGD) WITH PROPOFOL  N/A 10/14/2015   Procedure: ESOPHAGOGASTRODUODENOSCOPY (EGD) WITH PROPOFOL ;  Surgeon: Cassie Click, MD;  Location: [facility] ENDOSCOPY;  Service: Endoscopy;  Laterality: N/A;   ESOPHAGOGASTRODUODENOSCOPY (EGD) WITH PROPOFOL  N/A 07/21/2023   Procedure: ESOPHAGOGASTRODUODENOSCOPY (EGD) WITH PROPOFOL ;  Surgeon: Toledo, Alphonsus Jeans, MD;  Location: [facility] ENDOSCOPY;  Service: Gastroenterology;  Laterality: N/A;   LAPAROTOMY N/A 10/08/2023   Procedure: LAPAROTOMY, EXPLORATORY;  Surgeon: Adalberto Acton, MD;  Location: [facility] OR;  Service: General;  Laterality: N/A;  [place] Intra-abdominal Abscess   TUMOR REMOVAL N/A    stomach    Social History:   reports that she has never smoked. She does not have any smokeless tobacco history on file. She reports that she does not drink alcohol and does not use drugs.  Allergies  Allergen Reactions   Levofloxacin Other (See Comments)    Patient stated that it makes her extremely sore, and she can hardly move.    Tramadol Nausea Only    History reviewed. No pertinent family  history.   Prior to Admission medications   Medication Sig Start Date End Date Taking? Authorizing Provider  Calcium  Carbonate-Vitamin D (CALCIUM  600 + D PO) Take 1 tablet by mouth 2 (two) times daily.     Yes [provider]  donepezil  (ARICEPT ) 5 MG tablet Take 1 tablet by mouth at bedtime. 07/19/23  07/18/24 Yes [provider]  feeding supplement (ENSURE ENLIVE / ENSURE PLUS) LIQD Take 237 mLs by mouth 2 (two) times daily between meals. 10/11/23  Yes Theadore Finger, MD  fish oil-omega-3 fatty acids  1000 MG capsule Take 1 capsule (1 g total) by mouth daily. 10/11/23  Yes Gonfa, Taye T, MD  fluticasone (FLONASE) 50 MCG/ACT nasal spray Place 2 sprays into both nostrils daily as needed for allergies. 07/07/23  Yes [provider]  hydroxychloroquine (PLAQUENIL) 200 MG tablet Take 200 mg by mouth daily.   Yes [provider]  lovastatin (MEVACOR) 20 MG tablet Take 20 mg by mouth at bedtime.     Yes [provider]  omeprazole (PRILOSEC) 40 MG capsule Take 40 mg by mouth in the morning and at bedtime.   Yes [provider]  sucralfate (CARAFATE) 1 g tablet Take 1 g by mouth 2 (two) times daily.   Yes [provider]  torsemide (DEMADEX) 20 MG tablet Take 30 mg by mouth daily. 09/04/23  Yes [provider]  venlafaxine  XR (EFFEXOR -XR) 75 MG 24 hr capsule Take 75 mg by mouth daily.   Yes [provider]    Physical Exam: Vitals:   10/17/23 0316  BP: (!) 112/48  Pulse: 87  Resp: 20  Temp: 98.6 F (37 C)  SpO2: 99%    Constitutional: NAD, calm, frail-appearing   Eyes: PERTLA, lids and conjunctivae normal ENMT: Mucous membranes are moist. Posterior pharynx clear of any exudate or lesions.   Neck: supple, no masses  Respiratory: no wheezing, no crackles. No accessory muscle use.  Cardiovascular: S1 & S2 heard, regular rate and rhythm. No extremity edema.  Abdomen: Soft, tender in lower abdomen. Bowel sounds active.  Musculoskeletal: no clubbing / cyanosis. No joint deformity upper and lower extremities.   Skin: no significant rashes, lesions, ulcers. Warm, dry, well-perfused. Neurologic: CN 2-12 grossly intact. Moving all extremities. Alert and oriented to person, place, and situation.  Psychiatric: Pleasant. Cooperative.     Labs and Imaging on Admission: I have personally reviewed following labs and imaging studies  CBC: Recent Labs  Lab 10/11/23 0756 10/16/23 1559  WBC 8.7 6.9  HGB 7.5* 8.2*  HCT 24.4* 26.9*  MCV 92.8 92.4  PLT 358 467*   Basic Metabolic Panel: Recent Labs  Lab 10/11/23 0756 10/16/23 1559  NA 138 137  K 3.7 2.8*  CL 105 93*  CO2 24 28  GLUCOSE 90 134*  BUN 11 26*  CREATININE 0.94 1.57*  CALCIUM  8.3* 9.6  MG 1.9  --    GFR: Estimated Creatinine Clearance: 16.4 mL/min (A) (by C-G formula based on SCr of 1.57 mg/dL (H)). Liver Function Tests: Recent Labs  Lab 10/16/23 1559  AST 27  ALT 15  ALKPHOS 47  BILITOT 0.5  PROT 7.1  ALBUMIN  3.1*   Recent Labs  Lab 10/16/23 1559  LIPASE 63*   No results for input(s): "AMMONIA" in the last 168 hours. Coagulation Profile: No results for input(s): "INR", "PROTIME" in the last 168 hours. Cardiac Enzymes: No results for input(s): "CKTOTAL", "CKMB", "CKMBINDEX", "TROPONINI" in the last 168 hours. BNP (last 3  results) No results for input(s): "PROBNP" in the last 8760 hours. HbA1C: No results for input(s): "HGBA1C" in the last 72 hours. CBG: Recent Labs  Lab 10/11/23 1236  GLUCAP 151*   Lipid Profile: No results for input(s): "CHOL", "HDL", "LDLCALC", "TRIG", "CHOLHDL", "LDLDIRECT" in the last 72 hours. Thyroid  Function Tests: No results for input(s): "TSH", "T4TOTAL", "FREET4", "T3FREE", "THYROIDAB" in the last 72 hours. Anemia Panel: No results for input(s): "VITAMINB12", "FOLATE", "FERRITIN", "TIBC", "IRON ", "RETICCTPCT" in the last 72 hours. Urine analysis:    Component Value Date/Time   COLORURINE STRAW (A) 10/16/2023 1957   APPEARANCEUR CLEAR (A) 10/16/2023 1957   LABSPEC 1.026 10/16/2023 1957   PHURINE 8.0 10/16/2023 1957   GLUCOSEU NEGATIVE 10/16/2023 1957   HGBUR NEGATIVE 10/16/2023 1957   BILIRUBINUR NEGATIVE 10/16/2023 1957   KETONESUR NEGATIVE 10/16/2023 1957   PROTEINUR NEGATIVE 10/16/2023  1957   NITRITE NEGATIVE 10/16/2023 1957   LEUKOCYTESUR NEGATIVE 10/16/2023 1957   Sepsis Labs: @LABRCNTIP (procalcitonin:4,lacticidven:4) ) Recent Results (from the past 240 hours)  Surgical pcr screen     Status: None   Collection Time: 10/08/23  5:54 AM   Specimen: Nasal Mucosa; Nasal Swab  Result Value Ref Range Status   MRSA, PCR NEGATIVE NEGATIVE Final   Staphylococcus aureus NEGATIVE NEGATIVE Final    Comment: (NOTE) The Xpert SA Assay (FDA approved for NASAL specimens in patients 75 years of age and older), is one component of a comprehensive surveillance program. It is not intended to diagnose infection nor to guide or monitor treatment. Performed at [facility], [address], [place], [place] [address]      Radiological Exams on Admission: CT ABDOMEN PELVIS W CONTRAST Result Date: 10/16/2023 CLINICAL DATA:  Right lower abdominal pain. Recent abdominal surgery with colostomy. EXAM: CT ABDOMEN AND PELVIS WITH CONTRAST TECHNIQUE: Multidetector CT imaging of the abdomen and pelvis was performed using the standard protocol following bolus administration of intravenous contrast. RADIATION DOSE REDUCTION: This exam was performed according to the departmental dose-optimization program which includes automated exposure control, adjustment of the mA and/or kV according to patient size and/or use of iterative reconstruction technique. CONTRAST:  75mL OMNIPAQUE  IOHEXOL  300 MG/ML  SOLN COMPARISON:  Most recent CT 10/06/2023 FINDINGS: Lower chest: Large hiatal hernia. No basilar airspace disease or pleural effusion. Hepatobiliary: Unremarkable appearance of the liver. Cholecystectomy without biliary dilatation. Pancreas: No ductal dilatation or inflammation. Spleen: Normal in size without focal abnormality. Adrenals/Urinary Tract: Normal adrenal glands. No hydronephrosis or renal calculi. No renal inflammation. Small hypodense renal lesions are likely cysts, no specific imaging follow-up  is recommended. Unremarkable urinary bladder. Stomach/Bowel: Bowel assessment is limited in the absence of enteric contrast and paucity of intra-abdominal fat. Large hiatal hernia with the entire stomach being intrathoracic, no change from prior exam. Descending colostomy. Stapled off sigmoid colon in the pelvis. Improved wall thickening of the sigmoid colon and rectum. The remainder of the colon is decompressed and not well assessed. No abnormal colonic distension no small bowel distension or evidence of obstruction/ileus. There is mild small bowel wall thickening involving pelvic bowel loops. Vascular/Lymphatic: Aortic atherosclerosis. No aneurysm. The portal vein is patent. No bulky abdominopelvic adenopathy. Reproductive: Status post hysterectomy. No adnexal masses. Other: Fluid collection within the deep pelvis posterior to the right of midline measures 7.7 x 3.4 x 4 cm, series 2, image 51. This has shifted in location from prior exam where a fluid collection to the left of midline measured 4.7 x 2.4 x 1.9 cm. There  is mild peripheral enhancement. No internal gas or enteric contrast. No surrounding fat stranding. Midline skin staples in place. No free intra-abdominal air. Musculoskeletal: Chronic scoliosis and degenerative change. There are no acute or suspicious osseous abnormalities. IMPRESSION: 1. Fluid collection within the deep pelvis posterior to the right of midline measures 7.7 x 3.4 x 4 cm. This has shifted in location from prior exam orbits to the left of midline, and increase in size. Findings are suspicious for abscess. 2. Descending colostomy. Stapled off sigmoid colon in the pelvis. Improved wall thickening of the sigmoid colon and rectum. 3. Mild small bowel wall thickening involving pelvic bowel loops, favored to be reactive adjacent to pelvic collection. 4. Large hiatal hernia with the entire stomach being intrathoracic, no change from prior exam. Aortic Atherosclerosis (ICD10-I70.0).  Electronically Signed   By: Chadwick Colonel M.D.   On: 10/16/2023 20:46    Assessment/Plan  1. Intraabdominal abscess  - Continue empiric antibiotics and bowel rest, follow-up on surgeon's recommendations    2. AKI  - SCr is 1.57, up from 0.9 a week ago  - Likely prerenal, no hydronephrosis on CT in ED  - Continue IVF hydration, renally-dose medications, repeat chem panel in am    3. Hypokalemia  - Replacing   4. Inflammatory arthritis  - Hold Plaquenil for now     DVT prophylaxis: SCDs  Code Status: Full  Level of Care: Level of care: Progressive Family Communication: None present   Disposition Plan:  Patient is from: Home  Anticipated d/c is to: TBD Anticipated d/c date is: 10/19/23  Patient currently: Pending surgical consultation, treatment of abscess  Consults called: Surgery  Admission status: Inpatient     Walton Guppy, MD [facility]  10/17/2023, 4:30 AM

## 2023-10-17 NOTE — ED Provider Notes (Signed)
-----------------------------------------   11:00 PM on 10/17/2023 -----------------------------------------  Assuming care from Dr. Karlynn Oyster.  In short, Vanessa Cisneros is a 78 y.o. female with a chief complaint of abdominal pain.  Refer to the original H&P for additional details.  The current plan of care is to transfer to [facility] for her primary surgical team given a worsening post-op abdominal abscess.  Patient has been accepted to hospitalist team (Dr. Michell Ahumada), awaiting bed assignment.   Clinical Course as of 10/17/23 0230  Sun Oct 17, 2023  0159 I inquired about the bed status and was informed that no progressive beds would be available until later this morning.  [facility] then checked to see if any telemetry beds would be available if the hospitals were to downgrade the level of care, and unfortunately no telemetry beds are available either.  Given the need for the patient to be at [facility] for surgery evaluation and possible OR in the morning, I contacted the ED and spoke with Dr. Alissa April of the [facility] ED.  He accepted the patient for transfer. [CF]  [facility] is here.  I reassessed the patient.  She is awake and alert and in no distress as long as she is not being palpated or removed.  She is pleased with the plan for transfer and is stable and appropriate for transportation to [facility]. [CF]    Clinical Course User Index [CF] Lynnda Sas, MD [DW] Kandee Orion, MD     Medications  hyaluronidase [facility]) injection 150 Units (150 Units Subcutaneous Not Given 10/16/23 1757)  lactated ringers  bolus 1,000 mL (0 mLs Intravenous Stopped 10/16/23 2058)  potassium chloride  10 mEq in 100 mL IVPB (0 mEq Intravenous Stopped 10/16/23 1904)  iohexol  (OMNIPAQUE ) 300 MG/ML solution 75 mL (75 mLs Intravenous Contrast Given 10/16/23 1752)  piperacillin -tazobactam (ZOSYN ) IVPB 3.375 g (0 g Intravenous Stopped 10/17/23 0216)     ED Discharge Orders     None      Final diagnoses:   Postprocedural intraabdominal abscess (HCC)     Lynnda Sas, MD 10/17/23 0231

## 2023-10-17 NOTE — Progress Notes (Signed)
 [facility] ROUNDING NOTE Alisah Ferner ZOX:[id number]  DOB: 07-11-1945  DOA: 10/17/2023  PCP: [name], MD  10/17/2023,9:11 AM  LOS: 0 days    Code Status: Full code   from: Home current Dispo: Likely home   78 year old white female known history of inflammatory arthritis lumbar stenosis CKD 3 Depression Treated 3/25-3/28 for colitis admitted and found to have possible ischemic versus infectious colitis eventually completed therapy with Augmentin  Readmitted on 4/30 through 5/5 with diverticulitis and abscess 5 x 3 x 2 cm-underwent ex lap partial sigmoid colectomy and colostomy and was discharged home with postop antibiotics  Represent 5/10 [facility] with modabdominal pain--some responsive to Tylenol  WBC 6.9 hemoglobin 8.2 BUN/creatinine 26/1.5 potassium 2.8 CT ABD pelvis 7 x 3.5 x 4 cm deep fluid collection posterior to rectum Suspicious for abscess descending colectomy noted improved small bowel wall thickening and large hiatal hernia  General surgery consulted given IV fluid potassium Zosyn  and she was transferred to [facility] for workup   Plan  Probable deep posterior pelvic abscess Keep n.p.o.-IR consulted for possible procedure 5/12-continue Zosyn  2.2 every 8  --pain control morphine  2 to 4 mg every 3 as needed hold oxycodone  by mouth  AKI on admission Likely secondary to volume depletion insensible losses Fluids as above-improving  Anemia of blood loss Possible dilutional component-hopeful this is not a bleed from area with anatomy Recheck hemoglobin later today Transfusing 1 unit PRBC  Severe hypokalemia Given 1 runs of IV potassium and K-Dur 20 twice daily Give 6 more rounds Add on magnesium  this morning and replete as needed   DVT prophylaxis: SCD  Status is: Inpatient Remains inpatient appropriate because:   May need surgery      Subjective: Coherent awake does not seem to be in distress comfortable appearing No chest pain Abdominal  pain is minimal ostomy is working well No nausea no vomiting  Objective + exam Vitals:   10/17/23 0530 10/17/23 0545 10/17/23 0800 10/17/23 0815  BP: (!) 106/43 (!) 114/51 (!) 103/44 (!) 103/42  Pulse: 78 89 83 81  Resp: 16 (!) 21 17 19   Temp:      SpO2: 98% 100% 98% 97%   There were no vitals filed for this visit.  Examination: EOMI NCAT no focal deficit no icterus no pallor no wheeze Abdominal soft no rebound no guarding ostomy in place Staples are intact she has no tenderness S1-S2 sinus rhythm no murmur No lower extremity edema ROM intact    Data Reviewed: reviewed   CBC    Component Value Date/Time   WBC 4.7 10/17/2023 0557   RBC 2.37 (L) 10/17/2023 0557   HGB 6.9 (LL) 10/17/2023 0557   HCT 22.6 (L) 10/17/2023 0557   PLT 395 10/17/2023 0557   MCV 95.4 10/17/2023 0557   MCH 29.1 10/17/2023 0557   MCHC 30.5 10/17/2023 0557   RDW 18.4 (H) 10/17/2023 0557   LYMPHSABS 0.7 10/06/2023 1812   MONOABS 0.7 10/06/2023 1812   EOSABS 0.0 10/06/2023 1812   BASOSABS 0.1 10/06/2023 1812      Latest Ref Rng & Units 10/17/2023    3:38 AM 10/16/2023    3:59 PM 10/11/2023    7:56 AM  CMP  Glucose 70 - 99 mg/dL 811  914  90   BUN 8 - 23 mg/dL 15  26  11    Creatinine 0.44 - 1.00 mg/dL 7.82  9.56  2.13   Sodium 135 - 145 mmol/L 137  137  138  Potassium 3.5 - 5.1 mmol/L 2.7  2.8  3.7   Chloride 98 - 111 mmol/L 95  93  105   CO2 22 - 32 mmol/L 31  28  24    Calcium  8.9 - 10.3 mg/dL 8.6  9.6  8.3   Total Protein 6.5 - 8.1 g/dL  7.1    Total Bilirubin 0.0 - 1.2 mg/dL  0.5    Alkaline Phos 38 - 126 U/L  47    AST 15 - 41 U/L  27    ALT 0 - 44 U/L  15      Scheduled Meds:  sodium chloride    Intravenous Once   pantoprazole  (PROTONIX ) IV  40 mg Intravenous Q24H   potassium chloride   20 mEq Oral Q4H   sodium chloride  flush  3 mL Intravenous Q12H   Continuous Infusions:  lactated ringers  75 mL (10/17/23 0445)   piperacillin -tazobactam (ZOSYN )  IV Stopped (10/17/23 0530)     Time  44  Verlie Glisson, MD  [facility]

## 2023-10-18 ENCOUNTER — Ambulatory Visit (HOSPITAL_COMMUNITY): Admitting: Nurse Practitioner

## 2023-10-18 ENCOUNTER — Inpatient Hospital Stay (HOSPITAL_COMMUNITY)

## 2023-10-18 DIAGNOSIS — T8143XA Infection following a procedure, organ and space surgical site, initial encounter: Secondary | ICD-10-CM | POA: Diagnosis not present

## 2023-10-18 DIAGNOSIS — K651 Peritoneal abscess: Secondary | ICD-10-CM | POA: Diagnosis not present

## 2023-10-18 LAB — BPAM RBC
Blood Product Expiration Date: 202506012359
ISSUE DATE / TIME: 202505111101
Unit Type and Rh: 6200

## 2023-10-18 LAB — GLUCOSE, CAPILLARY
Glucose-Capillary: 52 mg/dL — ABNORMAL LOW (ref 70–99)
Glucose-Capillary: 99 mg/dL (ref 70–99)
Glucose-Capillary: 71 mg/dL (ref 70–99)

## 2023-10-18 LAB — TYPE AND SCREEN
ABO/RH(D): A POS
Antibody Screen: NEGATIVE
Unit division: 0

## 2023-10-18 LAB — MAGNESIUM: Magnesium: 2 mg/dL (ref 1.7–2.4)

## 2023-10-18 LAB — CBC
HCT: 33.1 % — ABNORMAL LOW (ref 36.0–46.0)
Hemoglobin: 10 g/dL — ABNORMAL LOW (ref 12.0–15.0)
MCH: 28.7 pg (ref 26.0–34.0)
MCHC: 30.2 g/dL (ref 30.0–36.0)
MCV: 95.1 fL (ref 80.0–100.0)
Platelets: 411 10*3/uL — ABNORMAL HIGH (ref 150–400)
RBC: 3.48 MIL/uL — ABNORMAL LOW (ref 3.87–5.11)
RDW: 18.1 % — ABNORMAL HIGH (ref 11.5–15.5)
WBC: 4.8 10*3/uL (ref 4.0–10.5)
nRBC: 0 % (ref 0.0–0.2)

## 2023-10-18 LAB — BASIC METABOLIC PANEL WITH GFR
Anion gap: 14 (ref 5–15)
BUN: 11 mg/dL (ref 8–23)
CO2: 18 mmol/L — ABNORMAL LOW (ref 22–32)
Calcium: 8.7 mg/dL — ABNORMAL LOW (ref 8.9–10.3)
Chloride: 108 mmol/L (ref 98–111)
Creatinine, Ser: 1.02 mg/dL — ABNORMAL HIGH (ref 0.44–1.00)
GFR, Estimated: 56 mL/min — ABNORMAL LOW (ref >60)
Glucose, Bld: 53 mg/dL — ABNORMAL LOW (ref 70–99)
Potassium: 3.6 mmol/L (ref 3.5–5.1)
Sodium: 140 mmol/L (ref 135–145)

## 2023-10-18 MED ORDER — MIDAZOLAM HCL 2 MG/2ML IJ SOLN
INTRAMUSCULAR | Status: AC
  Filled 2023-10-18: qty 2

## 2023-10-18 MED ORDER — GABAPENTIN 100 MG PO CAPS
100.0000 mg | ORAL_CAPSULE | Freq: Three times a day (TID) | ORAL | Status: DC
  Administered 2023-10-18 – 2023-10-20 (×6): 100 mg via ORAL
  Filled 2023-10-18 (×6): qty 1

## 2023-10-18 MED ORDER — MIDAZOLAM HCL 2 MG/2ML IJ SOLN
INTRAMUSCULAR | Status: AC | PRN
  Administered 2023-10-18: 1 mg via INTRAVENOUS

## 2023-10-18 MED ORDER — FENTANYL CITRATE (PF) 100 MCG/2ML IJ SOLN
INTRAMUSCULAR | Status: AC | PRN
  Administered 2023-10-18: 25 ug via INTRAVENOUS

## 2023-10-18 MED ORDER — FENTANYL CITRATE (PF) 100 MCG/2ML IJ SOLN
INTRAMUSCULAR | Status: AC
  Filled 2023-10-18: qty 2

## 2023-10-18 MED ORDER — LIDOCAINE HCL 1 % IJ SOLN
10.0000 mL | Freq: Once | INTRAMUSCULAR | Status: AC
  Administered 2023-10-18: 10 mL via INTRADERMAL
  Filled 2023-10-18: qty 10

## 2023-10-18 MED ORDER — DEXTROSE 50 % IV SOLN
25.0000 mL | Freq: Once | INTRAVENOUS | Status: AC
  Administered 2023-10-18: 25 mL via INTRAVENOUS

## 2023-10-18 MED ORDER — DEXTROSE 50 % IV SOLN
INTRAVENOUS | Status: AC
  Filled 2023-10-18: qty 50

## 2023-10-18 MED ORDER — IBUPROFEN 400 MG PO TABS
600.0000 mg | ORAL_TABLET | Freq: Three times a day (TID) | ORAL | Status: DC
  Administered 2023-10-18 – 2023-10-20 (×5): 600 mg via ORAL
  Filled 2023-10-18 (×5): qty 2

## 2023-10-18 MED ORDER — SODIUM CHLORIDE 0.9% FLUSH
5.0000 mL | Freq: Three times a day (TID) | INTRAVENOUS | Status: DC
  Administered 2023-10-18 – 2023-10-20 (×6): 5 mL

## 2023-10-18 MED ORDER — PIPERACILLIN-TAZOBACTAM 3.375 G IVPB
3.3750 g | Freq: Three times a day (TID) | INTRAVENOUS | Status: DC
  Administered 2023-10-18 – 2023-10-20 (×6): 3.375 g via INTRAVENOUS
  Filled 2023-10-18 (×6): qty 50

## 2023-10-18 MED ORDER — ALUM & MAG HYDROXIDE-SIMETH 200-200-20 MG/5ML PO SUSP
30.0000 mL | ORAL | Status: DC | PRN
  Administered 2023-10-18 – 2023-10-19 (×2): 30 mL via ORAL
  Filled 2023-10-18 (×2): qty 30

## 2023-10-18 NOTE — Progress Notes (Signed)
 PHARMACY NOTE:  ANTIMICROBIAL RENAL DOSAGE ADJUSTMENT  Current antimicrobial regimen includes a mismatch between antimicrobial dosage and estimated renal function.  As per policy approved by the Pharmacy & Therapeutics and Medical Executive Committees, the antimicrobial dosage will be adjusted accordingly.  Current antimicrobial dosage:  2.25g IV q8h  Indication: intra-abdominal infection  Renal Function:  Estimated Creatinine Clearance: 25.2 mL/min (A) (by C-G formula based on SCr of 1.02 mg/dL (H)). []      On intermittent HD, scheduled: []      On CRRT    Antimicrobial dosage has been changed to:  3.375g IV q8h (4hr infusion)  Additional comments:   Thank you for allowing pharmacy to be a part of this patient's care.  Armanda Bern, PharmD, BCPS 10/18/2023 2:54 PM

## 2023-10-18 NOTE — Progress Notes (Signed)
 Pt back from IR s/p R transgluteal jp drain placement.  Pt without complaints of pain.  VS WNL.  Standby assist to bathroom, after which pt ambulated in hall 14' with front wheel walker.  Tolerated very well.  Back to recliner in room, call bell in reach.  Surgical team notified pt is in room and will be in to round soon.  Will address diet order at that time.

## 2023-10-18 NOTE — Procedures (Signed)
 Interventional Radiology Procedure Note  Procedure: CT DRAIN PELVIC FLD COLLECTION    Complications: None  Estimated Blood Loss:  MIN  Findings: DARK BLOODY FLD MORE CONSISTENT WITH CHRONIC HEMATOMA 10 FR DRAIN CX SENT    Dayne Even, MD

## 2023-10-18 NOTE — Progress Notes (Signed)
 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  Reports no abdominal pain, n/v. Having ostomy output. Wants to eat.  Reports she has been emptying her ostomy bag at home but would like more teaching on how to change the bag.   Afebrile. No tachycardia or systolic hypotension. WBC wnl.  Hgb 10 from 6.9 after 1U PRBC yesterday. To note, hgb 7.5 prior to d/c (5/5).  Was d/c home 5/5 to home. Lives at home with her grandson.   Objective: Vital signs in last 24 hours: Temp:  [97.6 F (36.4 C)-98.7 F (37.1 C)] 97.6 F (36.4 C) (05/12 1018) Pulse Rate:  [74-92] 87 (05/12 1018) Resp:  [16-24] 24 (05/12 1018) BP: (96-131)/(41-69) 117/44 (05/12 1018) SpO2:  [95 %-100 %] 97 % (05/12 1018) Last BM Date : 10/18/23  Intake/Output from previous day: 05/11 0701 - 05/12 0700 In: 684.6 [I.V.:684.6] Out: 250 [Stool:250] Intake/Output this shift: Total I/O In: -  Out: 17 [Drains:17]  PE: Gen:  Alert, NAD, pleasant Abd: Soft, mild distension, NT. Stoma viable. Ostomy bag w/ thin brown liquid stool in bag. Midline wound cdi w/ staples in place, IR JP bloody SS.   Lab Results:  Recent Labs    10/17/23 0557 10/18/23 0406  WBC 4.7 4.8  HGB 6.9* 10.0*  HCT 22.6* 33.1*  PLT 395 411*   BMET Recent Labs    10/17/23 1602 10/18/23 0406  NA 140 140  K 3.8 3.6  CL 104 108  CO2 23 18*  GLUCOSE 81 53*  BUN 9 11  CREATININE 0.99 1.02*  CALCIUM  8.8* 8.7*   PT/INR No results for input(s): "LABPROT", "INR" in the last 72 hours. CMP     Component Value Date/Time   NA 140 10/18/2023 0406   K 3.6 10/18/2023 0406   CL 108 10/18/2023 0406   CO2 18 (L) 10/18/2023 0406   GLUCOSE 53 (L) 10/18/2023 0406   BUN 11 10/18/2023 0406   CREATININE 1.02 (H) 10/18/2023 0406   CALCIUM  8.7 (L) 10/18/2023 0406   PROT 7.1 10/16/2023 1559   ALBUMIN  3.1 (L) 10/16/2023 1559   AST 27 10/16/2023 1559   ALT 15 10/16/2023 1559   ALKPHOS 47 10/16/2023 1559    BILITOT 0.5 10/16/2023 1559   GFRNONAA 56 (L) 10/18/2023 0406   GFRAA 59 (L) 09/30/2015 1105   Lipase     Component Value Date/Time   LIPASE 63 (H) 10/16/2023 1559    Studies/Results: CT GUIDED PERITONEAL/RETROPERITONEAL FLUID DRAIN BY PERC CATH Result Date: 10/18/2023 INDICATION: POSTOP POSTERIOR DEPENDENT PELVIC FLUID COLLECTION EXAM: CT DRAINAGE PELVIC FLUID COLLECTION VIA RIGHT TRANS GLUTEAL APPROACH TECHNIQUE: Multidetector CT imaging of the PELVIS was performed following the standard protocol without IV contrast. RADIATION DOSE REDUCTION: This exam was performed according to the departmental dose-optimization program which includes automated exposure control, adjustment of the mA and/or kV according to patient size and/or use of iterative reconstruction technique. MEDICATIONS: The patient is currently admitted to the hospital and receiving intravenous antibiotics. The antibiotics were administered within an appropriate time frame prior to the initiation of the procedure. ANESTHESIA/SEDATION: Moderate (conscious) sedation was employed during this procedure. A total of Versed  1.0 mg and Fentanyl  25 mcg was administered intravenously by the radiology nurse. Total intra-service moderate Sedation Time: 15 minutes. The patient's level of consciousness and vital signs were monitored continuously by radiology nursing throughout the procedure under my direct supervision. COMPLICATIONS: None immediate. PROCEDURE: Informed  written consent was obtained from the patient after a thorough discussion of the procedural risks, benefits and alternatives. All questions were addressed. Maximal Sterile Barrier Technique was utilized including caps, mask, sterile gowns, sterile gloves, sterile drape, hand hygiene and skin antiseptic. A timeout was performed prior to the initiation of the procedure. Previous imaging reviewed. Patient position prone. Noncontrast localization CT performed. The dependent pelvic fluid  collection was localized and marked for a right trans gluteal approach. Under sterile conditions and local anesthesia, the 18 gauge 10 cm access needle was advanced from a medial right trans gluteal approach into the fluid collection. Needle position confirmed with CT. Syringe aspiration yielded dark thin bloody fluid compatible with chronic hematoma. Sample sent for culture. Guidewire inserted followed by tract dilatation to insert a 10 [place] drain. Drain catheter position confirmed with CT. Catheter secured with a silk suture and a sterile dressing. Suction bulb applied. No immediate complication. Patient tolerated the procedure well. IMPRESSION: Successful CT-guided right transgluteal pelvic fluid collection drain procedure. Electronically Signed   By: Melven Stable.  Shick M.D.   On: 10/18/2023 10:32   CT ABDOMEN PELVIS W CONTRAST Result Date: 10/16/2023 CLINICAL DATA:  Right lower abdominal pain. Recent abdominal surgery with colostomy. EXAM: CT ABDOMEN AND PELVIS WITH CONTRAST TECHNIQUE: Multidetector CT imaging of the abdomen and pelvis was performed using the standard protocol following bolus administration of intravenous contrast. RADIATION DOSE REDUCTION: This exam was performed according to the departmental dose-optimization program which includes automated exposure control, adjustment of the mA and/or kV according to patient size and/or use of iterative reconstruction technique. CONTRAST:  75mL OMNIPAQUE  IOHEXOL  300 MG/ML  SOLN COMPARISON:  Most recent CT 10/06/2023 FINDINGS: Lower chest: Large hiatal hernia. No basilar airspace disease or pleural effusion. Hepatobiliary: Unremarkable appearance of the liver. Cholecystectomy without biliary dilatation. Pancreas: No ductal dilatation or inflammation. Spleen: Normal in size without focal abnormality. Adrenals/Urinary Tract: Normal adrenal glands. No hydronephrosis or renal calculi. No renal inflammation. Small hypodense renal lesions are likely cysts, no specific  imaging follow-up is recommended. Unremarkable urinary bladder. Stomach/Bowel: Bowel assessment is limited in the absence of enteric contrast and paucity of intra-abdominal fat. Large hiatal hernia with the entire stomach being intrathoracic, no change from prior exam. Descending colostomy. Stapled off sigmoid colon in the pelvis. Improved wall thickening of the sigmoid colon and rectum. The remainder of the colon is decompressed and not well assessed. No abnormal colonic distension no small bowel distension or evidence of obstruction/ileus. There is mild small bowel wall thickening involving pelvic bowel loops. Vascular/Lymphatic: Aortic atherosclerosis. No aneurysm. The portal vein is patent. No bulky abdominopelvic adenopathy. Reproductive: Status post hysterectomy. No adnexal masses. Other: Fluid collection within the deep pelvis posterior to the right of midline measures 7.7 x 3.4 x 4 cm, series 2, image 51. This has shifted in location from prior exam where a fluid collection to the left of midline measured 4.7 x 2.4 x 1.9 cm. There is mild peripheral enhancement. No internal gas or enteric contrast. No surrounding fat stranding. Midline skin staples in place. No free intra-abdominal air. Musculoskeletal: Chronic scoliosis and degenerative change. There are no acute or suspicious osseous abnormalities. IMPRESSION: 1. Fluid collection within the deep pelvis posterior to the right of midline measures 7.7 x 3.4 x 4 cm. This has shifted in location from prior exam orbits to the left of midline, and increase in size. Findings are suspicious for abscess. 2. Descending colostomy. Stapled off sigmoid colon in the pelvis. Improved wall thickening of  the sigmoid colon and rectum. 3. Mild small bowel wall thickening involving pelvic bowel loops, favored to be reactive adjacent to pelvic collection. 4. Large hiatal hernia with the entire stomach being intrathoracic, no change from prior exam. Aortic Atherosclerosis  (ICD10-I70.0). Electronically Signed   By: Chadwick Colonel M.D.   On: 10/16/2023 20:46    Anti-infectives: Anti-infectives (From admission, onward)    Start     Dose/Rate Route Frequency Ordered Stop   10/17/23 0500  piperacillin -tazobactam (ZOSYN ) IVPB 2.25 g        2.25 g 100 mL/hr over 30 Minutes Intravenous Every 8 hours 10/17/23 0446        Path  A. COLON, DISTAL SIGMOID, COLECTOMY:  - Segment of colon (12 cm) with diverticulosis  - Benign reactive lymph node  - Margins appear viable   Assessment/Plan POD 10 s/p Exploratory laparotomy, partial sigmoid colectomy with end colostomy by Dr. 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 Drain per IR - Cont abx, cx pending - Adv diet as tolerated - Plan staple removal POD 14 - Hgb 10 from 6.9 after 1U PRBC yesterday. HDS w/o tachycardia or systolic hypotension. Trend hgb - We will follow with you  FEN - FLD, ADAT VTE - SCDs, okay for chem ppx from a general surgery standpoint ID - Zosyn   I reviewed nursing notes, hospitalist notes, last 24 h vitals and pain scores, last 48 h intake and output, last 24 h labs and trends, and last 24 h imaging results.    LOS: 1 day    Delton Filbert, [facility] Surgery 10/18/2023, 11:02 AM Please see Amion for pager number during day hours 7:00am-4:30pm

## 2023-10-18 NOTE — Consult Note (Signed)
 WOC Nurse ostomy consult note Stoma type/location: LLQ, colostomy  Stomal assessment/size: 1 1/4 inch  Peristomal assessment: intact Treatment options for stomal/peristomal skin: barrier ring  Output: 90 Ostomy pouching: 2pc flat 2 3/4 pouch (requested bedside nurse to order 2 1/4 inch pouches and skin barrier rings) Education provided: patient reports she has  been managing emptying of pouch independently.  Patient educated and practiced with cutting opening in skin barrier, application of barrier ring and attachment of pouch to skin barrier.  Template for skin barrier created and provided to patient. Patient verbalized understanding and had no follow-up questions.    Enrolled patient in [employer] DC program: No- already enrolled during prior admission.  Gillermo Lack, RN, MSN, [facility] WOC Team

## 2023-10-18 NOTE — Progress Notes (Signed)
 [facility] ROUNDING NOTE Chao Howdeshell ZOX:[id number]  DOB: September 22, 1945  DOA: 10/17/2023  PCP: [name], MD  10/18/2023,8:46 AM  LOS: 1 day    Code Status: Full code   from: Home current Dispo: Likely home   78 year old white female known history of inflammatory arthritis lumbar stenosis CKD 3 Depression Treated 3/25-3/28 for colitis admitted and found to have possible ischemic versus infectious colitis eventually completed therapy with Augmentin  Readmitted on 4/30 through 5/5 with diverticulitis and abscess 5 x 3 x 2 cm-underwent ex lap partial sigmoid colectomy and colostomy and was discharged home with postop antibiotics  Represent 5/10 [facility] with modabdominal pain--some responsive to Tylenol  WBC 6.9 hemoglobin 8.2 BUN/creatinine 26/1.5 potassium 2.8 CT ABD pelvis 7 x 3.5 x 4 cm deep fluid collection posterior to rectum Suspicious for abscess descending colectomy noted improved small bowel wall thickening and large hiatal hernia  General surgery consulted given IV fluid potassium Zosyn  and she was transferred to [facility] for workup   Plan  Probable deep posterior pelvic abscess N.p.o. 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 Received 1 unit of blood on 5/11 Possible dilutional component-hopeful this is not a bleed from area with anatomy Hemoglobin looks stable in the 10 range  Severe hypokalemia Replaced with both IV and p.o. and potassium is now stable Recheck periodic labs   DVT prophylaxis: SCD  Status is: Inpatient Remains inpatient appropriate because:   May need surgery      Subjective:  Pleasant coherent awake alert  no distress looks comfortable feels fair no chest pain no nausea no vomiting  Objective + exam Vitals:   10/17/23 1958 10/18/23 0014 10/18/23 0354 10/18/23 0805  BP: (!) 126/53 (!) 122/48 (!) 131/56 (!) 116/49  Pulse: 85 75 86 74  Resp: 18 17 18  (!) 21  Temp: 98.1 F (36.7 C) 98 F (36.7 C) 97.9 F (36.6 C) 98 F (36.7 C)  TempSrc: Oral Oral Oral Oral  SpO2: 97% 98% 97% 95%   There were no vitals filed for this visit.  Examination:  Pleasant coherent no distress EOMI NCAT no focal deficit No icterus no pallor Abdominal soft no rebound ostomy working No lower extremity edema ROM intact   Data Reviewed: reviewed   CBC    Component Value Date/Time   WBC 4.8 10/18/2023 0406   RBC 3.48 (L) 10/18/2023 0406   HGB 10.0 (L) 10/18/2023 0406   HCT 33.1 (L) 10/18/2023 0406   PLT 411 (H) 10/18/2023 0406   MCV 95.1 10/18/2023 0406   MCH 28.7 10/18/2023 0406   MCHC 30.2 10/18/2023 0406   RDW 18.1 (H) 10/18/2023 0406   LYMPHSABS 0.7 10/06/2023 1812   MONOABS 0.7 10/06/2023 1812   EOSABS 0.0 10/06/2023 1812   BASOSABS 0.1 10/06/2023 1812      Latest Ref Rng & Units 10/18/2023    4:06 AM 10/17/2023    4:02 PM 10/17/2023    3:38 AM  CMP  Glucose 70 - 99 mg/dL 53  81  811   BUN 8 - 23 mg/dL 11  9  15    Creatinine 0.44 - 1.00 mg/dL 9.14  7.82  9.56   Sodium 135 - 145  mmol/L 140  140  137   Potassium 3.5 - 5.1 mmol/L 3.6  3.8  2.7   Chloride 98 - 111 mmol/L 108  104  95   CO2 22 - 32 mmol/L 18  23  31    Calcium  8.9 - 10.3 mg/dL 8.7  8.8  8.6     Scheduled Meds:  sodium chloride    Intravenous Once   pantoprazole  (PROTONIX ) IV  40 mg Intravenous Q24H   sodium chloride  flush  3 mL Intravenous Q12H   Continuous Infusions:  sodium chloride  75 mL/hr at 10/18/23 0500   piperacillin -tazobactam (ZOSYN )  IV 2.25 g (10/18/23 0613)    Time  44  Verlie Glisson, MD  [facility]

## 2023-10-19 ENCOUNTER — Ambulatory Visit (HOSPITAL_COMMUNITY): Admitting: Nurse Practitioner

## 2023-10-19 DIAGNOSIS — K651 Peritoneal abscess: Secondary | ICD-10-CM | POA: Diagnosis not present

## 2023-10-19 DIAGNOSIS — T8143XA Infection following a procedure, organ and space surgical site, initial encounter: Secondary | ICD-10-CM | POA: Diagnosis not present

## 2023-10-19 LAB — CBC
HCT: 29.9 % — ABNORMAL LOW (ref 36.0–46.0)
Hemoglobin: 9.3 g/dL — ABNORMAL LOW (ref 12.0–15.0)
MCH: 29 pg (ref 26.0–34.0)
MCHC: 31.1 g/dL (ref 30.0–36.0)
MCV: 93.1 fL (ref 80.0–100.0)
Platelets: 415 10*3/uL — ABNORMAL HIGH (ref 150–400)
RBC: 3.21 MIL/uL — ABNORMAL LOW (ref 3.87–5.11)
RDW: 17.7 % — ABNORMAL HIGH (ref 11.5–15.5)
WBC: 4.6 10*3/uL (ref 4.0–10.5)
nRBC: 0 % (ref 0.0–0.2)

## 2023-10-19 LAB — BASIC METABOLIC PANEL WITH GFR
Anion gap: 8 (ref 5–15)
BUN: 10 mg/dL (ref 8–23)
CO2: 23 mmol/L (ref 22–32)
Calcium: 8.6 mg/dL — ABNORMAL LOW (ref 8.9–10.3)
Chloride: 108 mmol/L (ref 98–111)
Creatinine, Ser: 0.94 mg/dL (ref 0.44–1.00)
GFR, Estimated: >60 mL/min (ref >60)
Glucose, Bld: 92 mg/dL (ref 70–99)
Potassium: 3.8 mmol/L (ref 3.5–5.1)
Sodium: 139 mmol/L (ref 135–145)

## 2023-10-19 MED ORDER — ACETAMINOPHEN 500 MG PO TABS: 1000.0000 mg | ORAL_TABLET | Freq: Four times a day (QID) | ORAL | Status: DC | PRN

## 2023-10-19 MED ORDER — METHOCARBAMOL 500 MG PO TABS
500.0000 mg | ORAL_TABLET | Freq: Three times a day (TID) | ORAL | Status: DC
  Administered 2023-10-19 – 2023-10-20 (×4): 500 mg via ORAL
  Filled 2023-10-19 (×4): qty 1

## 2023-10-19 MED ORDER — ACETAMINOPHEN 500 MG PO TABS
1000.0000 mg | ORAL_TABLET | Freq: Three times a day (TID) | ORAL | Status: DC
  Administered 2023-10-19 – 2023-10-20 (×4): 1000 mg via ORAL
  Filled 2023-10-19 (×4): qty 2

## 2023-10-19 NOTE — Progress Notes (Signed)
 Mobility Specialist Progress Note:    10/19/23 1027  Mobility  Activity Ambulated with assistance to bathroom;Transferred from bed to chair  Level of Assistance Standby assist, set-up cues, supervision of patient - no hands on  Assistive Device Front wheel walker  Distance Ambulated (ft) 30 ft  Activity Response Tolerated well  Mobility Referral Yes  Mobility visit 1 Mobility  Mobility Specialist Start Time (ACUTE ONLY) 1027  Mobility Specialist Stop Time (ACUTE ONLY) 1033  Mobility Specialist Time Calculation (min) (ACUTE ONLY) 6 min   Pt received in bathroom. Assisted pt back in room, agreeable to sit up in chair. SBA with RW for safety. Tolerated well, c/o buttocks pain. Call bell and phone in reach. All needs met.   Caysen Whang Mobility Specialist Please contact via Special educational needs teacher or  Rehab office at [phone]

## 2023-10-19 NOTE — Progress Notes (Addendum)
 Subjective: CC: Cc pain around IR drain. Not relieved with anything per her report Tolerating PO. Having gas and liquid stool per ostomy.  Objective: Vital signs in last 24 hours: Temp:  [97.6 F (36.4 C)-98.4 F (36.9 C)] 98.1 F (36.7 C) (05/13 0820) Pulse Rate:  [67-92] 85 (05/13 0820) Resp:  [13-24] 20 (05/13 0820) BP: (108-132)/(44-57) 111/51 (05/13 0820) SpO2:  [94 %-100 %] 100 % (05/13 0356) Last BM Date : 10/18/23  Intake/Output from previous day: 05/12 0701 - 05/13 0700 In: 322.1 [P.O.:120; I.V.:102.1; IV Piggyback:100] Out: 277 [Drains:37; Stool:240] Intake/Output this shift: No intake/output data recorded.  PE: Gen:  Alert, NAD, pleasant Abd: Soft, ND/NT. Stoma viable. Ostomy bag w/ thin brown liquid stool and gas in bag. Midline wound cdi w/ staples in place, no cellulitis.  IR JP bloody SS. 37 mL documented  Lab Results:  Recent Labs    10/18/23 0406 10/19/23 0425  WBC 4.8 4.6  HGB 10.0* 9.3*  HCT 33.1* 29.9*  PLT 411* 415*   BMET Recent Labs    10/18/23 0406 10/19/23 0425  NA 140 139  K 3.6 3.8  CL 108 108  CO2 18* 23  GLUCOSE 53* 92  BUN 11 10  CREATININE 1.02* 0.94  CALCIUM  8.7* 8.6*   PT/INR No results for input(s): "LABPROT", "INR" in the last 72 hours. CMP     Component Value Date/Time   NA 139 10/19/2023 0425   K 3.8 10/19/2023 0425   CL 108 10/19/2023 0425   CO2 23 10/19/2023 0425   GLUCOSE 92 10/19/2023 0425   BUN 10 10/19/2023 0425   CREATININE 0.94 10/19/2023 0425   CALCIUM  8.6 (L) 10/19/2023 0425   PROT 7.1 10/16/2023 1559   ALBUMIN  3.1 (L) 10/16/2023 1559   AST 27 10/16/2023 1559   ALT 15 10/16/2023 1559   ALKPHOS 47 10/16/2023 1559   BILITOT 0.5 10/16/2023 1559   GFRNONAA >60 10/19/2023 0425   GFRAA 59 (L) 09/30/2015 1105   Lipase     Component Value Date/Time   LIPASE 63 (H) 10/16/2023 1559    Studies/Results: CT GUIDED PERITONEAL/RETROPERITONEAL FLUID DRAIN BY PERC CATH Result Date:  10/18/2023 INDICATION: POSTOP POSTERIOR DEPENDENT PELVIC FLUID COLLECTION EXAM: CT DRAINAGE PELVIC FLUID COLLECTION VIA RIGHT TRANS GLUTEAL APPROACH TECHNIQUE: Multidetector CT imaging of the PELVIS was performed following the standard protocol without IV contrast. RADIATION DOSE REDUCTION: This exam was performed according to the departmental dose-optimization program which includes automated exposure control, adjustment of the mA and/or kV according to patient size and/or use of iterative reconstruction technique. MEDICATIONS: The patient is currently admitted to the hospital and receiving intravenous antibiotics. The antibiotics were administered within an appropriate time frame prior to the initiation of the procedure. ANESTHESIA/SEDATION: Moderate (conscious) sedation was employed during this procedure. A total of Versed  1.0 mg and Fentanyl  25 mcg was administered intravenously by the radiology nurse. Total intra-service moderate Sedation Time: 15 minutes. The patient's level of consciousness and vital signs were monitored continuously by radiology nursing throughout the procedure under my direct supervision. COMPLICATIONS: None immediate. PROCEDURE: Informed written consent was obtained from the patient after a thorough discussion of the procedural risks, benefits and alternatives. All questions were addressed. Maximal Sterile Barrier Technique was utilized including caps, mask, sterile gowns, sterile gloves, sterile drape, hand hygiene and skin antiseptic. A timeout was performed prior to the initiation of the procedure. Previous imaging reviewed. Patient position prone. Noncontrast localization CT performed. The dependent pelvic fluid  collection was localized and marked for a right trans gluteal approach. Under sterile conditions and local anesthesia, the 18 gauge 10 cm access needle was advanced from a medial right trans gluteal approach into the fluid collection. Needle position confirmed with CT. Syringe  aspiration yielded dark thin bloody fluid compatible with chronic hematoma. Sample sent for culture. Guidewire inserted followed by tract dilatation to insert a 10 [place] drain. Drain catheter position confirmed with CT. Catheter secured with a silk suture and a sterile dressing. Suction bulb applied. No immediate complication. Patient tolerated the procedure well. IMPRESSION: Successful CT-guided right transgluteal pelvic fluid collection drain procedure. Electronically Signed   By: Melven Stable.  Shick M.D.   On: 10/18/2023 10:32    Anti-infectives: Anti-infectives (From admission, onward)    Start     Dose/Rate Route Frequency Ordered Stop   10/18/23 1600  piperacillin -tazobactam (ZOSYN ) IVPB 3.375 g        3.375 g 12.5 mL/hr over 240 Minutes Intravenous Every 8 hours 10/18/23 1454     10/17/23 0500  piperacillin -tazobactam (ZOSYN ) IVPB 2.25 g  Status:  Discontinued        2.25 g 100 mL/hr over 30 Minutes Intravenous Every 8 hours 10/17/23 0446 10/18/23 1454      Path  A. COLON, DISTAL SIGMOID, COLECTOMY:  - Segment of colon (12 cm) with diverticulosis  - Benign reactive lymph node  - Margins appear viable   Assessment/Plan POD 11 s/p Exploratory laparotomy, partial sigmoid colectomy with end colostomy by Dr. 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 Drain per IR - Cont abx, cx NGTD. Will discuss with attending but may not need abx at discharge. - tolerating soft diet, ok for regular from [facility] standpoint - Plan staple removal on the day of discharge - Hgb 19.3 (10); was 6.9 2 days ago, s/p 1U PRBC yesterday. HDS w/o tachycardia or systolic hypotension. Trend hgb - We will follow with you; today I added robaxin  and K pad for her pain at drain site. - plan to work on pain control  today and clear for D/C tomorrow 5/14   FEN - soft, ADAT VTE - SCDs, okay for chem ppx from a general surgery standpoint ID - Zosyn   I reviewed nursing notes, hospitalist notes, last 24 h vitals and pain scores, last 48 h intake and output, last 24 h labs and trends, and last 24 h imaging results.    LOS: 2 days    Charlott Converse, [facility] Surgery 10/19/2023, 8:36 AM Please see Amion for pager number during day hours 7:00am-4:30pm

## 2023-10-19 NOTE — Progress Notes (Signed)
 [facility] ROUNDING NOTE Vanessa Cisneros UEA:[id number]  DOB: 1946/04/22  DOA: 10/17/2023  PCP: [name], MD  10/19/2023,9:35 AM  LOS: 2 days    Code Status: Full code   from: Home current Dispo: Likely home   78 year old white female known history of inflammatory arthritis lumbar stenosis CKD 3 Depression Treated 3/25-3/28 for colitis admitted and found to have possible ischemic versus infectious colitis eventually completed therapy with Augmentin  Readmitted on 4/30 through 5/5 with diverticulitis and abscess 5 x 3 x 2 cm-underwent ex lap partial sigmoid colectomy and colostomy and was discharged home with postop antibiotics  Represent 5/10 [facility] with modabdominal pain--some responsive to Tylenol  WBC 6.9 hemoglobin 8.2 BUN/creatinine 26/1.5 potassium 2.8 CT ABD pelvis 7 x 3.5 x 4 cm deep fluid collection posterior to rectum Suspicious for abscess descending colectomy noted improved small bowel wall thickening and large hiatal hernia  General surgery consulted given IV fluid potassium Zosyn  and she was transferred to [facility] for workup 5/12 IR drainage-consistent with dark bloody fluid likely hematoma  Plan  Probable deep posterior pelvic abscess N.p.o. for IR drainage of pelvic abscess 5/12-diet as per general surgery Continue Zosyn  every 8 IV --drainage cultures so far neg from 5/12 await input from general surgery but may not need antibiotics-defer to them Pain control is suboptimal.  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  Anemia of blood loss with dilutional component? Received 1 unit of blood on 5/11 Possible dilutional component--hemoglobin stable in the 9-10 range  Severe  hypokalemia Replaced and resolved at the present time   DVT prophylaxis: SCD  Status is: Inpatient Remains inpatient appropriate because:   Await improvement in pain   Subjective:  Looks fair.  Feels uncomfortable in the posterior area with radiation of pain was able to get up to the restroom no fever no chills   Objective + exam Vitals:   10/18/23 2006 10/18/23 2333 10/19/23 0356 10/19/23 0820  BP: (!) 108/51 (!) 115/47 (!) 118/44 (!) 111/51  Pulse: 89 67 70 85  Resp: 18 13 18 20   Temp: 98.2 F (36.8 C) 98.4 F (36.9 C) 98.3 F (36.8 C) 98.1 F (36.7 C)  TempSrc: Oral Oral Oral Oral  SpO2: 96% 98% 100%    There were no vitals filed for this visit.  Examination:  NCAT no focal deficit no icterus no pallor Chest clear no added sound no wheeze no rales no rhonchi Ostomy in place with staples JP drain shows red stained liquid predominantly sanguinous No lower extremity edema S1-S2 no murmur on monitors sinus rhythm No focal deficit  Data Reviewed: reviewed   CBC    Component Value Date/Time   WBC 4.6 10/19/2023 0425   RBC 3.21 (L) 10/19/2023 0425   HGB 9.3 (L) 10/19/2023 0425   HCT 29.9 (L) 10/19/2023 0425   PLT 415 (H) 10/19/2023 0425   MCV 93.1 10/19/2023 0425   MCH 29.0 10/19/2023 0425   MCHC 31.1 10/19/2023 0425   RDW 17.7 (H) 10/19/2023 0425   LYMPHSABS 0.7 10/06/2023 1812   MONOABS 0.7 10/06/2023 1812   EOSABS 0.0 10/06/2023 1812   BASOSABS 0.1 10/06/2023 1812      Latest Ref Rng & Units 10/19/2023  4:25 AM 10/18/2023    4:06 AM 10/17/2023    4:02 PM  CMP  Glucose 70 - 99 mg/dL 92  53  81   BUN 8 - 23 mg/dL 10  11  9    Creatinine 0.44 - 1.00 mg/dL 1.19  1.47  8.29   Sodium 135 - 145 mmol/L 139  140  140   Potassium 3.5 - 5.1 mmol/L 3.8  3.6  3.8   Chloride 98 - 111 mmol/L 108  108  104   CO2 22 - 32 mmol/L 23  18  23    Calcium  8.9 - 10.3 mg/dL 8.6  8.7  8.8     Scheduled Meds:  sodium chloride    Intravenous Once   acetaminophen   1,000  mg Oral Q8H   gabapentin   100 mg Oral TID   ibuprofen  600 mg Oral TID WC   methocarbamol   500 mg Oral TID   pantoprazole  (PROTONIX ) IV  40 mg Intravenous Q24H   sodium chloride  flush  3 mL Intravenous Q12H   sodium chloride  flush  5 mL Intracatheter Q8H   Continuous Infusions:  piperacillin -tazobactam 3.375 g (10/19/23 0845)    Time  24  Verlie Glisson, MD  [facility]

## 2023-10-20 ENCOUNTER — Inpatient Hospital Stay: Admission: RE | Admit: 2023-10-20 | Source: Ambulatory Visit

## 2023-10-20 ENCOUNTER — Other Ambulatory Visit (HOSPITAL_COMMUNITY): Payer: Self-pay

## 2023-10-20 DIAGNOSIS — T8143XA Infection following a procedure, organ and space surgical site, initial encounter: Secondary | ICD-10-CM | POA: Diagnosis not present

## 2023-10-20 DIAGNOSIS — K651 Peritoneal abscess: Secondary | ICD-10-CM | POA: Diagnosis not present

## 2023-10-20 LAB — BASIC METABOLIC PANEL WITH GFR
Anion gap: 10 (ref 5–15)
BUN: 12 mg/dL (ref 8–23)
CO2: 22 mmol/L (ref 22–32)
Calcium: 8.6 mg/dL — ABNORMAL LOW (ref 8.9–10.3)
Chloride: 109 mmol/L (ref 98–111)
Creatinine, Ser: 1.18 mg/dL — ABNORMAL HIGH (ref 0.44–1.00)
GFR, Estimated: 47 mL/min — ABNORMAL LOW (ref >60)
Glucose, Bld: 105 mg/dL — ABNORMAL HIGH (ref 70–99)
Potassium: 4.8 mmol/L (ref 3.5–5.1)
Sodium: 141 mmol/L (ref 135–145)

## 2023-10-20 LAB — CBC
HCT: 34.7 % — ABNORMAL LOW (ref 36.0–46.0)
Hemoglobin: 10.5 g/dL — ABNORMAL LOW (ref 12.0–15.0)
MCH: 28.7 pg (ref 26.0–34.0)
MCHC: 30.3 g/dL (ref 30.0–36.0)
MCV: 94.8 fL (ref 80.0–100.0)
Platelets: 462 10*3/uL — ABNORMAL HIGH (ref 150–400)
RBC: 3.66 MIL/uL — ABNORMAL LOW (ref 3.87–5.11)
RDW: 18 % — ABNORMAL HIGH (ref 11.5–15.5)
WBC: 5.2 10*3/uL (ref 4.0–10.5)
nRBC: 0 % (ref 0.0–0.2)

## 2023-10-20 MED ORDER — GABAPENTIN 100 MG PO CAPS: 100.0000 mg | ORAL_CAPSULE | Freq: Three times a day (TID) | ORAL | 0 refills | Status: DC

## 2023-10-20 MED ORDER — OXYCODONE HCL 5 MG PO TABS: 5.0000 mg | ORAL_TABLET | Freq: Four times a day (QID) | ORAL | 0 refills | Status: DC | PRN

## 2023-10-20 MED ORDER — SODIUM CHLORIDE 0.9% FLUSH
10.0000 mL | Freq: Two times a day (BID) | INTRAVENOUS | Status: DC
  Administered 2023-10-20: 10 mL

## 2023-10-20 MED ORDER — PANTOPRAZOLE SODIUM 40 MG PO TBEC: 40.0000 mg | DELAYED_RELEASE_TABLET | Freq: Every day | ORAL | Status: DC

## 2023-10-20 MED ORDER — METHOCARBAMOL 500 MG PO TABS: 500.0000 mg | ORAL_TABLET | Freq: Three times a day (TID) | ORAL | 0 refills | Status: AC | PRN

## 2023-10-20 MED ORDER — IBUPROFEN 600 MG PO TABS: 600.0000 mg | ORAL_TABLET | Freq: Three times a day (TID) | ORAL | 0 refills | Status: AC | PRN

## 2023-10-20 MED ORDER — ACETAMINOPHEN 500 MG PO TABS: 1000.0000 mg | ORAL_TABLET | Freq: Three times a day (TID) | ORAL | 0 refills | Status: AC | PRN

## 2023-10-20 MED ORDER — SODIUM CHLORIDE 0.9% FLUSH: 10.0000 mL | INTRAVENOUS | Status: DC | PRN

## 2023-10-20 MED ORDER — SODIUM CHLORIDE FLUSH 0.9 % IV SOLN
10.0000 mL | INTRAVENOUS | 1 refills | Status: AC | PRN
  Filled 2023-10-20: qty 300, 30d supply, fill #0

## 2023-10-20 NOTE — Progress Notes (Signed)
 Subjective: CC: Pain control was better yesterday afternoon with addition of robaxin . Tolerated 100% of breakfast. Having bowel function. Walking independently to the bathroom.   Objective: Vital signs in last 24 hours: Temp:  [97.6 F (36.4 C)-98.2 F (36.8 C)] 97.6 F (36.4 C) (05/14 0413) Pulse Rate:  [68-89] 74 (05/14 0413) Resp:  [15-20] 15 (05/14 0413) BP: (93-110)/(41-48) 93/45 (05/14 0413) SpO2:  [96 %-100 %] 97 % (05/14 0413) Last BM Date : 10/19/23  Intake/Output from previous day: 05/13 0701 - 05/14 0700 In: 244.3 [P.O.:120; I.V.:8; IV Piggyback:116.3] Out: 520 [Drains:20; Stool:500] Intake/Output this shift: No intake/output data recorded.  PE: Gen:  Alert, NAD, pleasant Abd: Soft, ND/NT. Stoma viable. Ostomy bag w/ thin brown liquid stool and gas in bag. Midline wound cdi w/ staples in place, no cellulitis.  IR JP SS. 20 mL documented  Lab Results:  Recent Labs    10/18/23 0406 10/19/23 0425  WBC 4.8 4.6  HGB 10.0* 9.3*  HCT 33.1* 29.9*  PLT 411* 415*   BMET Recent Labs    10/19/23 0425 10/20/23 0359  NA 139 141  K 3.8 4.8  CL 108 109  CO2 23 22  GLUCOSE 92 105*  BUN 10 12  CREATININE 0.94 1.18*  CALCIUM  8.6* 8.6*   PT/INR No results for input(s): "LABPROT", "INR" in the last 72 hours. CMP     Component Value Date/Time   NA 141 10/20/2023 0359   K 4.8 10/20/2023 0359   CL 109 10/20/2023 0359   CO2 22 10/20/2023 0359   GLUCOSE 105 (H) 10/20/2023 0359   BUN 12 10/20/2023 0359   CREATININE 1.18 (H) 10/20/2023 0359   CALCIUM  8.6 (L) 10/20/2023 0359   PROT 7.1 10/16/2023 1559   ALBUMIN  3.1 (L) 10/16/2023 1559   AST 27 10/16/2023 1559   ALT 15 10/16/2023 1559   ALKPHOS 47 10/16/2023 1559   BILITOT 0.5 10/16/2023 1559   GFRNONAA 47 (L) 10/20/2023 0359   GFRAA 59 (L) 09/30/2015 1105   Lipase     Component Value Date/Time   LIPASE 63 (H) 10/16/2023 1559    Studies/Results: CT GUIDED PERITONEAL/RETROPERITONEAL FLUID DRAIN  BY PERC CATH Result Date: 10/18/2023 INDICATION: POSTOP POSTERIOR DEPENDENT PELVIC FLUID COLLECTION EXAM: CT DRAINAGE PELVIC FLUID COLLECTION VIA RIGHT TRANS GLUTEAL APPROACH TECHNIQUE: Multidetector CT imaging of the PELVIS was performed following the standard protocol without IV contrast. RADIATION DOSE REDUCTION: This exam was performed according to the departmental dose-optimization program which includes automated exposure control, adjustment of the mA and/or kV according to patient size and/or use of iterative reconstruction technique. MEDICATIONS: The patient is currently admitted to the hospital and receiving intravenous antibiotics. The antibiotics were administered within an appropriate time frame prior to the initiation of the procedure. ANESTHESIA/SEDATION: Moderate (conscious) sedation was employed during this procedure. A total of Versed  1.0 mg and Fentanyl  25 mcg was administered intravenously by the radiology nurse. Total intra-service moderate Sedation Time: 15 minutes. The patient's level of consciousness and vital signs were monitored continuously by radiology nursing throughout the procedure under my direct supervision. COMPLICATIONS: None immediate. PROCEDURE: Informed written consent was obtained from the patient after a thorough discussion of the procedural risks, benefits and alternatives. All questions were addressed. Maximal Sterile Barrier Technique was utilized including caps, mask, sterile gowns, sterile gloves, sterile drape, hand hygiene and skin antiseptic. A timeout was performed prior to the initiation of the procedure. Previous imaging reviewed. Patient position prone. Noncontrast localization CT performed.  The dependent pelvic fluid collection was localized and marked for a right trans gluteal approach. Under sterile conditions and local anesthesia, the 18 gauge 10 cm access needle was advanced from a medial right trans gluteal approach into the fluid collection. Needle position  confirmed with CT. Syringe aspiration yielded dark thin bloody fluid compatible with chronic hematoma. Sample sent for culture. Guidewire inserted followed by tract dilatation to insert a 10 [place] drain. Drain catheter position confirmed with CT. Catheter secured with a silk suture and a sterile dressing. Suction bulb applied. No immediate complication. Patient tolerated the procedure well. IMPRESSION: Successful CT-guided right transgluteal pelvic fluid collection drain procedure. Electronically Signed   By: Melven Stable.  Shick M.D.   On: 10/18/2023 10:32    Anti-infectives: Anti-infectives (From admission, onward)    Start     Dose/Rate Route Frequency Ordered Stop   10/18/23 1600  piperacillin -tazobactam (ZOSYN ) IVPB 3.375 g        3.375 g 12.5 mL/hr over 240 Minutes Intravenous Every 8 hours 10/18/23 1454     10/17/23 0500  piperacillin -tazobactam (ZOSYN ) IVPB 2.25 g  Status:  Discontinued        2.25 g 100 mL/hr over 30 Minutes Intravenous Every 8 hours 10/17/23 0446 10/18/23 1454      Path  A. COLON, DISTAL SIGMOID, COLECTOMY:  - Segment of colon (12 cm) with diverticulosis  - Benign reactive lymph node  - Margins appear viable   Assessment/Plan POD 11 s/p Exploratory laparotomy, partial sigmoid colectomy with end colostomy by Dr. 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 Drain per IR - Cont abx, cx NGTD. Will discuss with attending but may not need abx at discharge. - tolerating diet - Plan staple removal on the day of discharge - today - stable for discharge from a surgical perspective. Drain cultures NGTD. Afebrile. Normal WBC. Has been on Zosyn  for 3 days. JP in place. I do not think she needs abx at discharge. - stable for discharge.  FEN - soft, ADAT VTE - SCDs, okay  for chem ppx from a general surgery standpoint ID - Zosyn   I reviewed nursing notes, hospitalist notes, last 24 h vitals and pain scores, last 48 h intake and output, last 24 h labs and trends, and last 24 h imaging results.    LOS: 3 days    Charlott Converse, [facility] 10/20/2023, 8:56 AM Please see Amion for pager number during day hours 7:00am-4:30pm

## 2023-10-20 NOTE — Discharge Instructions (Signed)
 Interventional Radiology Percutaneous Abscess Drain Placement After Care   This sheet gives you information about how to care for yourself after your procedure. Your health care provider may also give you more specific instructions. Your drain was placed by an interventional radiologist with [facility]. If you have questions or concerns, contact [facility] at [phone].   What is a percutaneous drain?   A drain is a small plastic tube (catheter) that goes into the fluid collection in your body through your skin.   How long will I need the drain?   How long the drain needs to stay in is determined by where the drain is, how much comes out of the drain each day and if you are having any other surgical procedures.   Interventional radiology will determine when it is time to remove the drain. It is important to follow up as directed so that the drain can be removed as soon as it is safe to do so.   What can I expect after the procedure?   After the procedure, it is common to have:   A small amount of bruising and discomfort in the area where the drainage tube (catheter) was placed.   Sleepiness and fatigue. This should go away after the medicines you were given have worn off.   Follow these instructions at home:   Insertion site care   Check your insertion site when you change the bandage. Check for:   More redness, swelling, or pain.   More fluid or blood.   Warmth.   Pus or a bad smell.   When caring for your insertion site:   Wash your hands with soap and water for at least 20 seconds before and after you change your bandage (dressing). If soap and water are not available, use hand sanitizer.   You do not need to change your dressing everyday if it is clean and dry. Change your dressing every 3 days or as needed when it is soiled, wet or becoming dislodged. You will need to change your dressing each time you shower.   Leave stitches (sutures), skin  glue, or adhesive strips in place. These skin closures may need to stay in place for 2 weeks or longer. If adhesive strip edges start to loosen and curl up, you may trim the loose edges. Do not remove adhesive strips completely unless your health care provider tells you to do so.   Catheter care   Flush the catheter once per day with 5 mL of 0.9% normal saline unless you are told otherwise by your healthcare provider. This helps to prevent clogs in the catheter.   To disconnect the drain, turn the clear plastic tube to the left. Attach the saline syringe by placing it on the white end of the drain and turning gently to the right. Once attached gently push the plunger to the 5 mL mark. After you are done flushing, disconnect the syringe by turning to the left and reattach your drainage container   If you have a bulb please be sure the bulb is charged after reconnecting it - to do this pinch the bulb between your thumb and first finger and close the stopper located on the top of the bulb.    Check for fluid leaking from around your catheter (instead of fluid draining through your catheter). This may be a sign that the drain is no longer working correctly.   Write down the following information every time you empty your  bag:   The date and time.   The amount of drainage.   Activity   Rest at home for 1-2 days after your procedure.   For the first 48 hours do not lift anything more than 10 lbs (about a gallon of milk). You may perform moderate activities/exercise. Please avoid strenuous activities during this time.   Avoid any activities which may pull on your drain as this can cause your drain to become dislodged.   If you were given a sedative during the procedure, it can affect you for several hours. Do not drive or operate machinery until your health care provider says that it is safe.   General instructions   For mild pain take over-the-counter medications as needed for pain such as  Tylenol or Advil. If you are experiencing severe pain please call our office as this may indicate an issue with your drain.    If you were prescribed an antibiotic medicine, take it as told by your health care provider. Do not stop using the antibiotic even if you start to feel better.   You may shower 24 hours after the drain is placed. To do this cover the insertion site with a water tight material such as saran wrap and seal the edges with tape, you may also purchase waterproof dressings at your local drug store. Shower as usual and then remove the water tight dressing and any gauze/tape underneath it once you have exited the shower and dried off. Allow the area to air dry or pat dry with a clean towel. Once the skin is completely dry place a new gauze dressing. It is important to keep the site dry at all times to prevent infection.   Do not submerge the drain - this means you cannot take baths, swim, use a hot tub, etc. until the drain is removed.    Do not use any products that contain nicotine or tobacco, such as cigarettes, e-cigarettes, and chewing tobacco. If you need help quitting, ask your health care provider.   Keep all follow-up visits as told by your health care provider. This is important.   Contact a health care provider if:   You have less than 10 mL of drainage a day for 2-3 days in a row, or as directed by your health care provider.   You have any of these signs of infection:   More redness, swelling, or pain around your incision area.   More fluid or blood coming from your incision area.   Warmth coming from your incision area.   Pus or a bad smell coming from your incision area.   You have fluid leaking from around your catheter (instead of through your catheter).   You are unable to flush the drain.   You have a fever or chills.   You have pain that does not get better with medicine.   You have not been contacted to schedule a drain follow up appointment  within 10 days of discharge from the hospital.   Please call [facility] at [phone] with any questions or concerns.   Get help right away if:   Your catheter comes out.   You suddenly stop having drainage from your catheter.   You suddenly have blood in the fluid that is draining from your catheter.   You become dizzy or you faint.   You develop a rash.   You have nausea or vomiting.   You have difficulty breathing or you feel short  of breath.   You develop chest pain.   You have problems with your speech or vision.   You have trouble balancing or moving your arms or legs.   Summary   It is common to have a small amount of bruising and discomfort in the area where the drainage tube (catheter) was placed. You may also have minor discomfort with movement while the drain is in place.   Flush the drain once per day with 5 mL of 0.9% normal saline (unless you were told otherwise by your healthcare provider).    Record the amount of drainage from the bag every time you empty it.   Change the dressing every 3 days or earlier if soiled/wet. Keep the skin dry under the dressing.   You may shower with the drain in place. Do not submerge the drain (no baths, swimming, hot tubs, etc.).   Contact [facility] at [phone] if you have more redness, swelling, or pain around your incision area or if you have pain that does not get better with medicine.   This information is not intended to replace advice given to you by your health care provider. Make sure you discuss any questions you have with your health care provider.   Document Revised: 08/28/2021 Document Reviewed: 05/20/2019   Elsevier Patient Education  2023 [employer]         Interventional Radiology Drain Record   Empty your drain at least once per day. You may empty it as often as needed. Use this form to write down the amount of fluid that has collected in the drainage container. Bring  this form with you to your follow-up visits. Please call [facility] at [phone] with any questions or concerns prior to your appointment.   Drain #1 location: ___________________   Date __________ Time __________ Amount __________   Date __________ Time __________ Amount __________   Date __________ Time __________ Amount __________   Date __________ Time __________ Amount __________   Date __________ Time __________ Amount __________   Date __________ Time __________ Amount __________   Date __________ Time __________ Amount __________   Date __________ Time __________ Amount __________   Date __________ Time __________ Amount __________   Date __________ Time __________ Amount __________   Date __________ Time __________ Amount __________   Date __________ Time __________ Amount __________   Date __________ Time __________ Amount __________   Date __________ Time __________ Amount __________

## 2023-10-20 NOTE — Discharge Summary (Signed)
 Physician Discharge Summary  Vanessa Cisneros VWU:[id number] DOB: June 10, 1945 DOA: 10/17/2023  PCP: Vanessa Cunning, MD  Admit date: 10/17/2023 Discharge date: 10/20/2023  Admitted From: Home  Discharge disposition: Home with home health  Recommendations for Outpatient Follow-Up:   Follow up with your primary care provider in one week.  Check CBC, BMP, magnesium  in the next visit   Discharge Diagnosis:   Principal Problem:   Postprocedural intraabdominal abscess (HCC) Active Problems:   Hypokalemia   Acute kidney injury (nontraumatic) (HCC)   Inflammatory polyarthropathy (HCC)   Discharge Condition: Improved.  Diet recommendation: Soft diet  Wound care: None.  Code status: Full.   History of Present Illness:   Patient is 78 years old female with past medical history of inflammatory arthritis, CKD stage III, depression, recent treatment for colitis on 08/31/2023 with Augmentin  and readmission on 10/06/2023 for diverticulitis with abscess status post ex lap sigmoid colectomy and colostomy presented to [facility] on 10/16/2023 with moderate abdominal pain.  Initial labs showed hypokalemia with WBC at 6.9.  CT scan of the abdomen and pelvis showed 7 x 3.5 x 4 cm deep fluid collection posterior to rectum suspicious for abscess.  General surgery was consulted.  Patient was given IV fluids potassium and Zosyn  and was admitted to hospital for further evaluation and treatment.  On 10/18/2023 patient underwent IR guided drainage with removal of dark bloody fluid likely hematoma.   Hospital Course:   Following conditions were addressed during hospitalization as listed below,  Probable deep posterior pelvic abscess Status post IR drainage of pelvic abscess with drain.  At this time patient will be discharged home on a drain and IR will follow.  Patient received IV Zosyn  during hospitalization which will be discontinued at this time as per general surgery recommendation.  Patient  will be on good pain management regimen on discharge  AKI on admission superimposed on CKD 3A Likely secondary to volume depletion insensible losses.  Creatinine has improved to 1.1 at this time.   Anemia of blood loss with dilutional component Received 1 unit of blood on 5/11.  Hemoglobin has remained stable after that.  Latest hemoglobin prior to discharge was 10.5.   Severe hypokalemia Replenished and improved.  Potassium prior to discharge was 4.8.  Underweight BMI:  16.16 kg/m.  Present on admission.  Patient would benefit from ongoing nutritional support on discharge.  Disposition.  At this time, patient is stable for disposition home with outpatient PCP, general surgery and IR follow-up.  Medical Consultants:   General Surgery Interventional radiology  Procedures:    IR guided drainage of pelvic abscess 10/18/2023 with placement of drain. Subjective:   Today, patient was seen and examined at bedside.  Denies any nausea, vomiting, fever, chills or rigor.  Has some pelvic pain.  Discharge Exam:   Vitals:   10/20/23 0413 10/20/23 1004  BP: (!) 93/45 (!) 104/42  Pulse: 74 83  Resp: 15 18  Temp: 97.6 F (36.4 C) (!) 97.5 F (36.4 C)  SpO2: 97% 96%   Vitals:   10/19/23 1950 10/19/23 2340 10/20/23 0413 10/20/23 1004  BP: (!) 110/48 (!) 93/46 (!) 93/45 (!) 104/42  Pulse: 89 68 74 83  Resp: 17 16 15 18   Temp: 98.2 F (36.8 C) 97.6 F (36.4 C) 97.6 F (36.4 C) (!) 97.5 F (36.4 C)  TempSrc: Oral Oral Oral Oral  SpO2: 100% 96% 97% 96%    General: Alert awake, not in obvious distress, thinly built. HENT:  pupils equally reacting to light,  No scleral pallor or icterus noted. Oral mucosa is moist.  Chest:  Clear breath sounds.  Diminished breath sounds bilaterally. No crackles or wheezes.  CVS: S1 &S2 heard. No murmur.  Regular rate and rhythm. Abdomen: Soft, nontender, nondistended.  Bowel sounds are heard.  Colostomy bag in place.  JP drain in  place. Extremities: No cyanosis, clubbing or edema.  Peripheral pulses are palpable. Psych: Alert, awake and, Communicative CNS:  No cranial nerve deficits.  Power equal in all extremities.   Skin: Warm and dry.  No rashes noted.  The results of significant diagnostics from this hospitalization (including imaging, microbiology, ancillary and laboratory) are listed below for reference.     Diagnostic Studies:   CT GUIDED PERITONEAL/RETROPERITONEAL FLUID DRAIN BY PERC CATH Result Date: 10/18/2023 INDICATION: POSTOP POSTERIOR DEPENDENT PELVIC FLUID COLLECTION EXAM: CT DRAINAGE PELVIC FLUID COLLECTION VIA RIGHT TRANS GLUTEAL APPROACH TECHNIQUE: Multidetector CT imaging of the PELVIS was performed following the standard protocol without IV contrast. RADIATION DOSE REDUCTION: This exam was performed according to the departmental dose-optimization program which includes automated exposure control, adjustment of the mA and/or kV according to patient size and/or use of iterative reconstruction technique. MEDICATIONS: The patient is currently admitted to the hospital and receiving intravenous antibiotics. The antibiotics were administered within an appropriate time frame prior to the initiation of the procedure. ANESTHESIA/SEDATION: Moderate (conscious) sedation was employed during this procedure. A total of Versed  1.0 mg and Fentanyl  25 mcg was administered intravenously by the radiology nurse. Total intra-service moderate Sedation Time: 15 minutes. The patient's level of consciousness and vital signs were monitored continuously by radiology nursing throughout the procedure under my direct supervision. COMPLICATIONS: None immediate. PROCEDURE: Informed written consent was obtained from the patient after a thorough discussion of the procedural risks, benefits and alternatives. All questions were addressed. Maximal Sterile Barrier Technique was utilized including caps, mask, sterile gowns, sterile gloves, sterile  drape, hand hygiene and skin antiseptic. A timeout was performed prior to the initiation of the procedure. Previous imaging reviewed. Patient position prone. Noncontrast localization CT performed. The dependent pelvic fluid collection was localized and marked for a right trans gluteal approach. Under sterile conditions and local anesthesia, the 18 gauge 10 cm access needle was advanced from a medial right trans gluteal approach into the fluid collection. Needle position confirmed with CT. Syringe aspiration yielded dark thin bloody fluid compatible with chronic hematoma. Sample sent for culture. Guidewire inserted followed by tract dilatation to insert a 10 [place] drain. Drain catheter position confirmed with CT. Catheter secured with a silk suture and a sterile dressing. Suction bulb applied. No immediate complication. Patient tolerated the procedure well. IMPRESSION: Successful CT-guided right transgluteal pelvic fluid collection drain procedure. Electronically Signed   By: Melven Stable.  Shick M.D.   On: 10/18/2023 10:32     Labs:   Basic Metabolic Panel: Recent Labs  Lab 10/17/23 0338 10/17/23 1602 10/18/23 0406 10/19/23 0425 10/20/23 0359  NA 137 140 140 139 141  K 2.7* 3.8 3.6 3.8 4.8  CL 95* 104 108 108 109  CO2 31 23 18* 23 22  GLUCOSE 106* 81 53* 92 105*  BUN 15 9 11 10 12   CREATININE 1.31* 0.99 1.02* 0.94 1.18*  CALCIUM  8.6* 8.8* 8.7* 8.6* 8.6*  MG  --  1.9 2.0  --   --    GFR Estimated Creatinine Clearance: 21.8 mL/min (A) (by C-G formula based on SCr of 1.18 mg/dL (H)). Liver Function Tests: Recent  Labs  Lab 10/16/23 1559  AST 27  ALT 15  ALKPHOS 47  BILITOT 0.5  PROT 7.1  ALBUMIN  3.1*   Recent Labs  Lab 10/16/23 1559  LIPASE 63*   No results for input(s): "AMMONIA" in the last 168 hours. Coagulation profile No results for input(s): "INR", "PROTIME" in the last 168 hours.  CBC: Recent Labs  Lab 10/16/23 1559 10/17/23 0557 10/18/23 0406 10/19/23 0425 10/20/23 0844   WBC 6.9 4.7 4.8 4.6 5.2  HGB 8.2* 6.9* 10.0* 9.3* 10.5*  HCT 26.9* 22.6* 33.1* 29.9* 34.7*  MCV 92.4 95.4 95.1 93.1 94.8  PLT 467* 395 411* 415* 462*   Cardiac Enzymes: No results for input(s): "CKTOTAL", "CKMB", "CKMBINDEX", "TROPONINI" in the last 168 hours. BNP: Invalid input(s): "POCBNP" CBG: Recent Labs  Lab 10/18/23 0833 10/18/23 0903 10/18/23 1156  GLUCAP 52* 99 71   D-Dimer No results for input(s): "DDIMER" in the last 72 hours. Hgb A1c No results for input(s): "HGBA1C" in the last 72 hours. Lipid Profile No results for input(s): "CHOL", "HDL", "LDLCALC", "TRIG", "CHOLHDL", "LDLDIRECT" in the last 72 hours. Thyroid  function studies No results for input(s): "TSH", "T4TOTAL", "T3FREE", "THYROIDAB" in the last 72 hours.  Invalid input(s): "FREET3" Anemia work up No results for input(s): "VITAMINB12", "FOLATE", "FERRITIN", "TIBC", "IRON ", "RETICCTPCT" in the last 72 hours. Microbiology Recent Results (from the past 240 hours)  Aerobic/Anaerobic Culture w Gram Stain (surgical/deep wound)     Status: None (Preliminary result)   Collection Time: 10/18/23  9:56 AM   Specimen: Pelvis; Abscess  Result Value Ref Range Status   Specimen Description PELVIS  Final   Special Requests NONE  Final   Gram Stain   Final    RARE WBC PRESENT, PREDOMINANTLY PMN NO ORGANISMS SEEN    Culture   Final    NO GROWTH 2 DAYS Performed at [facility], [address] [place], [place] [address]    Report Status PENDING  Incomplete     Discharge Instructions:   Discharge Instructions     Call MD for:  persistant nausea and vomiting   Complete by: As directed    Call MD for:  severe uncontrolled pain   Complete by: As directed    Call MD for:  temperature >100.4   Complete by: As directed    Diet general   Complete by: As directed    Discharge instructions   Complete by: As directed    Follow-up with your primary care provider in 1 week.  Check blood work at that time.   Follow-up with interventional radiology as scheduled for drain tube management.  Office to schedule an appointment.  Follow-up with general surgery as has been scheduled by the clinic.  Seek medical attention for worsening symptoms.   Increase activity slowly   Complete by: As directed    No wound care   Complete by: As directed       Allergies as of 10/20/2023       Reactions   Levofloxacin Other (See Comments)   Patient stated that it makes her extremely sore, and she can hardly move.    Tramadol Nausea Only        Medication List     TAKE these medications    acetaminophen  500 MG tablet Commonly known as: TYLENOL  Take 2 tablets (1,000 mg total) by mouth every 8 (eight) hours as needed for mild pain (pain score 1-3).   CALCIUM  600 + D PO Take 1 tablet by  mouth 2 (two) times daily.   donepezil  5 MG tablet Commonly known as: ARICEPT  Take 1 tablet by mouth at bedtime.   feeding supplement Liqd Take 237 mLs by mouth 2 (two) times daily between meals.   fish oil-omega-3 fatty acids  1000 MG capsule Take 1 capsule (1 g total) by mouth daily.   fluticasone 50 MCG/ACT nasal spray Commonly known as: FLONASE Place 2 sprays into both nostrils daily as needed for allergies.   gabapentin  100 MG capsule Commonly known as: NEURONTIN  Take 1 capsule (100 mg total) by mouth 3 (three) times daily.   hydroxychloroquine 200 MG tablet Commonly known as: PLAQUENIL Take 200 mg by mouth daily.   ibuprofen 600 MG tablet Commonly known as: ADVIL Take 1 tablet (600 mg total) by mouth every 8 (eight) hours as needed for up to 15 days for moderate pain (pain score 4-6) or mild pain (pain score 1-3).   lovastatin 20 MG tablet Commonly known as: MEVACOR Take 20 mg by mouth at bedtime.   methocarbamol  500 MG tablet Commonly known as: ROBAXIN  Take 1 tablet (500 mg total) by mouth 3 (three) times daily as needed for up to 15 days for muscle spasms.   omeprazole 40 MG capsule Commonly  known as: PRILOSEC Take 40 mg by mouth in the morning and at bedtime.   oxyCODONE  5 MG immediate release tablet Commonly known as: Roxicodone  Take 1 tablet (5 mg total) by mouth every 6 (six) hours as needed for severe pain (pain score 7-10) or breakthrough pain.   sodium chloride  flush 0.9 % Soln injection Inject 10 mLs into the vein as needed.   sucralfate 1 g tablet Commonly known as: CARAFATE Take 1 g by mouth 2 (two) times daily.   torsemide 20 MG tablet Commonly known as: DEMADEX Take 30 mg by mouth daily.   venlafaxine  XR 75 MG 24 hr capsule Commonly known as: EFFEXOR -XR Take 75 mg by mouth daily.        Follow-up Information     Vanessa Cunning, MD Follow up in 1 week(s).   Specialty: Internal Medicine Contact information: [phone] [facility] [address] [facility] [place] [address] [phone]         Adalberto Acton, MD. Go on 10/29/2023.   Specialty: General Surgery Why: For post-operative follow up with the surgeon. appointment is at 1:50 PM, please arrive by 1:30 PM Contact information: [address] [place] [address] [phone]                  Time coordinating discharge: 39 minutes  Signed:  Gregoire Bennis  [facility] 10/20/2023, 11:53 AM

## 2023-10-20 NOTE — Progress Notes (Signed)

## 2023-10-20 NOTE — Progress Notes (Signed)
 Mobility Specialist Progress Note:    10/20/23 0920  Mobility  Activity Ambulated with assistance to bathroom  Level of Assistance Standby assist, set-up cues, supervision of patient - no hands on  Assistive Device Front wheel walker  Distance Ambulated (ft) 24 ft  Activity Response Tolerated well  Mobility Referral Yes  Mobility visit 1 Mobility  Mobility Specialist Start Time (ACUTE ONLY) 0920  Mobility Specialist Stop Time (ACUTE ONLY) 0930  Mobility Specialist Time Calculation (min) (ACUTE ONLY) 10 min   Pt requested assistance to bathroom. SBA with RW required for safety. Tolerated well, void successful. Returned pt to bed for IV team, will f/u to transfer to chair. C/o pain in buttocks. Left with all needs met.   Vanessa Cisneros Mobility Specialist Please contact via Special educational needs teacher or  Rehab office at [phone]

## 2023-10-20 NOTE — Progress Notes (Addendum)
 Staples removed and steri strips applied prior to discharge as ordered.  AVS given and reviewed with pt; grandson, Austine Lefort; and his wife, Ruthann Cover at bedside. Medications discussed. Sodium chloride  flush prescription transferred from [facility] to pt's preferred pharmacy, [facility] in [place] per pt request. All questions answered to satisfaction. All verbalized understanding of information given, including JP drain care. Pt to be escorted off the unit with all belongings via wheelchair by staff member.

## 2023-10-20 NOTE — Care Management Important Message (Signed)
 Important Message  Patient Details  Name: Vanessa Cisneros MRN: [id number] Date of Birth: 03-11-1946   Important Message Given:  Yes - Medicare IM     Janith Melnick 10/20/2023, 10:50 AM

## 2023-10-20 NOTE — Plan of Care (Signed)
  Problem: Pain Managment: Goal: General experience of comfort will improve and/or be controlled Outcome: Progressing   Problem: Safety: Goal: Ability to remain free from injury will improve Outcome: Progressing   Problem: Skin Integrity: Goal: Risk for impaired skin integrity will decrease Outcome: Progressing

## 2023-10-21 ENCOUNTER — Other Ambulatory Visit: Payer: Self-pay | Admitting: General Surgery

## 2023-10-21 DIAGNOSIS — K651 Peritoneal abscess: Secondary | ICD-10-CM

## 2023-10-23 LAB — AEROBIC/ANAEROBIC CULTURE W GRAM STAIN (SURGICAL/DEEP WOUND): Culture: NO GROWTH

## 2023-10-26 ENCOUNTER — Ambulatory Visit (HOSPITAL_COMMUNITY)
Admit: 2023-10-26 | Discharge: 2023-10-26 | Disposition: A | Discharge status: Home / Self Care | Source: Ambulatory Visit | Attending: Internal Medicine | Admitting: Internal Medicine

## 2023-10-26 ENCOUNTER — Other Ambulatory Visit (HOSPITAL_COMMUNITY): Payer: Self-pay | Admitting: Nurse Practitioner

## 2023-10-26 DIAGNOSIS — L24B3 Irritant contact dermatitis related to fecal or urinary stoma or fistula: Secondary | ICD-10-CM | POA: Diagnosis not present

## 2023-10-26 DIAGNOSIS — Z433 Encounter for attention to colostomy: Secondary | ICD-10-CM | POA: Diagnosis not present

## 2023-10-26 DIAGNOSIS — K56699 Other intestinal obstruction unspecified as to partial versus complete obstruction: Secondary | ICD-10-CM | POA: Insufficient documentation

## 2023-10-26 DIAGNOSIS — Z9049 Acquired absence of other specified parts of digestive tract: Secondary | ICD-10-CM | POA: Diagnosis not present

## 2023-10-26 DIAGNOSIS — Z933 Colostomy status: Secondary | ICD-10-CM | POA: Diagnosis present

## 2023-10-26 NOTE — Discharge Instructions (Signed)
 HH to assist with learning ostomy care Will set up with [facility]

## 2023-10-26 NOTE — Progress Notes (Signed)
 [facility]   Reason for visit:  LLQ colostomy HPI:  Pelvic abscess, exploratory laparotomy with sigmoid colectomy Past Medical History:  Diagnosis Date   Anemia    Arthritis    CHF (congestive heart failure) (HCC)    Depression    Esophageal reflux    GERD (gastroesophageal reflux disease)    Hyperlipidemia, mixed    Immunodeficiency (cell-mediated) (HCC)    Inflammatory polyarthropathy (HCC)    Lumbar stenosis with neurogenic claudication    No family history on file. Allergies  Allergen Reactions   Levofloxacin Other (See Comments)    Patient stated that it makes her extremely sore, and she can hardly move.    Tramadol Nausea Only   Current Outpatient Medications  Medication Sig Dispense Refill Last Dose/Taking   acetaminophen  (TYLENOL ) 500 MG tablet Take 2 tablets (1,000 mg total) by mouth every 8 (eight) hours as needed for mild pain (pain score 1-3). 30 tablet 0    Calcium  Carbonate-Vitamin D (CALCIUM  600 + D PO) Take 1 tablet by mouth 2 (two) times daily.        donepezil  (ARICEPT ) 5 MG tablet Take 1 tablet by mouth at bedtime.      feeding supplement (ENSURE ENLIVE / ENSURE PLUS) LIQD Take 237 mLs by mouth 2 (two) times daily between meals.      fish oil-omega-3 fatty acids  1000 MG capsule Take 1 capsule (1 g total) by mouth daily.      fluticasone (FLONASE) 50 MCG/ACT nasal spray Place 2 sprays into both nostrils daily as needed for allergies.      gabapentin  (NEURONTIN ) 100 MG capsule Take 1 capsule (100 mg total) by mouth 3 (three) times daily. 90 capsule 0    hydroxychloroquine (PLAQUENIL) 200 MG tablet Take 200 mg by mouth daily.      ibuprofen  (ADVIL ) 600 MG tablet Take 1 tablet (600 mg total) by mouth every 8 (eight) hours as needed for up to 15 days for moderate pain (pain score 4-6) or mild pain (pain score 1-3). 45 tablet 0    lovastatin (MEVACOR) 20 MG tablet Take 20 mg by mouth at bedtime.        methocarbamol  (ROBAXIN ) 500 MG tablet Take 1  tablet (500 mg total) by mouth 3 (three) times daily as needed for up to 15 days for muscle spasms. 45 tablet 0    omeprazole (PRILOSEC) 40 MG capsule Take 40 mg by mouth in the morning and at bedtime.      oxyCODONE  (ROXICODONE ) 5 MG immediate release tablet Take 1 tablet (5 mg total) by mouth every 6 (six) hours as needed for severe pain (pain score 7-10) or breakthrough pain. 15 tablet 0    sodium chloride  flush 0.9 % SOLN injection Use 10 mL flush as directed as needed. 300 mL 1    sucralfate (CARAFATE) 1 g tablet Take 1 g by mouth 2 (two) times daily.      torsemide (DEMADEX) 20 MG tablet Take 30 mg by mouth daily.      venlafaxine  XR (EFFEXOR -XR) 75 MG 24 hr capsule Take 75 mg by mouth daily.      No current facility-administered medications for this encounter.   ROS  Review of Systems  Respiratory: Negative.    Gastrointestinal:  Positive for abdominal pain.       LLQ colostomy  Genitourinary:  Positive for pelvic pain.       Pelvic abscess Drain in place   Skin:  Positive for  color change and rash.  Psychiatric/Behavioral: Negative.    All other systems reviewed and are negative.  Vital signs:  BP 139/72   Pulse (!) 108   Temp 98.2 F (36.8 C) (Oral)   Resp 18   SpO2 98%  Exam:  Physical Exam Vitals reviewed.  Constitutional:      Appearance: Normal appearance.  Cardiovascular:     Rate and Rhythm: Normal rate and regular rhythm.     Pulses: Normal pulses.     Heart sounds: Normal heart sounds.  Pulmonary:     Effort: Pulmonary effort is normal.     Breath sounds: Normal breath sounds.  Abdominal:     Palpations: Abdomen is soft.     Tenderness: There is abdominal tenderness.     Comments: Drain remains in place.   Musculoskeletal:        General: Normal range of motion.  Skin:    General: Skin is warm and dry.     Findings: Erythema present.  Neurological:     Mental Status: She is alert and oriented to person, place, and time. Mental status is at baseline.   Psychiatric:        Mood and Affect: Mood normal.        Behavior: Behavior normal.     Stoma type/location:  LLQ colostomy,  Stomal assessment/size:  32mm budded pink and moist  productive of liquid brown stool Peristomal assessment:  some erythema, stool was sitting on skin.  Pouch had been in place for 6 days.  HH has been to her home, but did not provide ostomy teaching yet.  Treatment options for stomal/peristomal skin: stoma powder and skin prep to peristomal irritation Barrier ring and 2 piece flat pouch.  Midline steri strips in place.  Output: liquid brown stool- states she empties 3 times day, only 1/3 full.  So no concerns for high output at this time.  Ostomy pouching: 2pc. 2 3/4" with barrier ring  Education provided: Patient grandson and caregiver present for visit.   I will set up with [facility], but understands that [facility] is providing supplies for now.   I demonstrate powder and skin prep to irritated skin.  We apply barrier ring. I allow patient to measure stoma and cut barrier to fit.  She is left handed and struggled with cutting barrier.  Once stoma is stabilized, would benefit from presized barrier.  She assembled barrier to pouch and I assisted her to apply to her skin.  She is able to empty and roll closed.  We discussed twice weekly pouch changes, showering with or without pouch in place.      Impression/dx  Colostomy Irritant contact dermatitis  Discussion  See above Plan  HH to assist with pouch changes in her home.  I will set up with [facility] for supplies after [facility] is out.  Follow up with clinic as needed    Visit time: 55 minutes.   Branda Cain FNP-BC

## 2023-10-27 ENCOUNTER — Inpatient Hospital Stay

## 2023-10-27 ENCOUNTER — Inpatient Hospital Stay: Attending: Oncology | Admitting: Oncology

## 2023-10-27 ENCOUNTER — Encounter: Payer: Self-pay | Admitting: Oncology

## 2023-10-27 VITALS — BP 108/57 | HR 90 | Temp 98.3°F | Resp 16 | Ht 59.0 in | Wt 73.0 lb

## 2023-10-27 DIAGNOSIS — D509 Iron deficiency anemia, unspecified: Secondary | ICD-10-CM | POA: Diagnosis not present

## 2023-10-27 DIAGNOSIS — D500 Iron deficiency anemia secondary to blood loss (chronic): Secondary | ICD-10-CM | POA: Insufficient documentation

## 2023-10-27 NOTE — Progress Notes (Signed)
 [facility]  Telephone:([phone]) [phone] Fax:([phone]) [phone]  ID: Vanessa Cisneros OB: [birth year]  MR#: [id number]  AOZ#:[id number]  Patient Care Team: [name], MD as PCP - General (Internal Medicine)  CHIEF COMPLAINT: Iron  deficiency anemia.  INTERVAL HISTORY: Patient is an 78 year old female who is recently admitted to the hospital with abdominal pain secondary to intra-abdominal abscess as well as anemia from acute blood loss.  She received 1 unit of blood as well as IV iron  during her inpatient stay.  She feels significantly improved and nearly back to her baseline.  She has no neurologic complaints.  She denies any recent fevers.  She has a good appetite and denies weight loss.  She has no chest pain, shortness of breath, cough, or hemoptysis.  She denies any further abdominal pain.  She has no nausea, vomiting, constipation, or diarrhea.  She has no melena or hematochezia.  She has no urinary complaints.  Patient offers no further specific complaints today.  REVIEW OF SYSTEMS:   Review of Systems  Constitutional: Negative.  Negative for fever, malaise/fatigue and weight loss.  Respiratory: Negative.  Negative for cough, hemoptysis and shortness of breath.   Cardiovascular: Negative.  Negative for chest pain and leg swelling.  Gastrointestinal: Negative.  Negative for abdominal pain, blood in stool and melena.  Genitourinary: Negative.  Negative for dysuria.  Musculoskeletal: Negative.  Negative for back pain.  Skin: Negative.  Negative for rash.  Neurological: Negative.  Negative for dizziness, focal weakness, weakness and headaches.  Psychiatric/Behavioral: Negative.  The patient is not nervous/anxious.     As per HPI. Otherwise, a complete review of systems is negative.  PAST MEDICAL HISTORY: Past Medical History:  Diagnosis Date   Anemia    Arthritis    CHF (congestive heart failure) (HCC)    Depression    Esophageal reflux    GERD  (gastroesophageal reflux disease)    Hyperlipidemia, mixed    Immunodeficiency (cell-mediated) (HCC)    Inflammatory polyarthropathy (HCC)    Lumbar stenosis with neurogenic claudication     PAST SURGICAL HISTORY: Past Surgical History:  Procedure Laterality Date   ABDOMINAL HYSTERECTOMY     BREAST BIOPSY Left 06/08/2012   neg   CHOLECYSTECTOMY     COLON RESECTION SIGMOID N/A 10/08/2023   Procedure: COLECTOMY, SIGMOID, OPEN;  Surgeon: Adalberto Acton, MD;  Location: [facility] OR;  Service: General;  Laterality: N/A;   COLONOSCOPY WITH PROPOFOL  N/A 07/21/2023   Procedure: COLONOSCOPY WITH PROPOFOL ;  Surgeon: Toledo, Alphonsus Jeans, MD;  Location: [facility] ENDOSCOPY;  Service: Gastroenterology;  Laterality: N/A;   COLOSTOMY N/A 10/08/2023   Procedure: CREATION, COLOSTOMY;  Surgeon: Adalberto Acton, MD;  Location: [facility] OR;  Service: General;  Laterality: N/A;   ESOPHAGOGASTRODUODENOSCOPY (EGD) WITH PROPOFOL  N/A 10/14/2015   Procedure: ESOPHAGOGASTRODUODENOSCOPY (EGD) WITH PROPOFOL ;  Surgeon: Cassie Click, MD;  Location: [facility] ENDOSCOPY;  Service: Endoscopy;  Laterality: N/A;   ESOPHAGOGASTRODUODENOSCOPY (EGD) WITH PROPOFOL  N/A 07/21/2023   Procedure: ESOPHAGOGASTRODUODENOSCOPY (EGD) WITH PROPOFOL ;  Surgeon: Toledo, Alphonsus Jeans, MD;  Location: [facility] ENDOSCOPY;  Service: Gastroenterology;  Laterality: N/A;   LAPAROTOMY N/A 10/08/2023   Procedure: LAPAROTOMY, EXPLORATORY;  Surgeon: Adalberto Acton, MD;  Location: [facility] OR;  Service: General;  Laterality: N/A;  [place] Intra-abdominal Abscess   TUMOR REMOVAL N/A    stomach    FAMILY HISTORY: History reviewed. No pertinent family history.  ADVANCED DIRECTIVES (Y/N):  N  HEALTH MAINTENANCE: Social History   Tobacco Use   Smoking status:  Never  Vaping Use   Vaping status: Never Used  Substance Use Topics   Alcohol use: No   Drug use: Never     Colonoscopy:  PAP:  Bone density:  Lipid panel:  Allergies  Allergen Reactions   Levofloxacin Other  (See Comments)    Patient stated that it makes her extremely sore, and she can hardly move.    Tramadol Nausea Only    Current Outpatient Medications  Medication Sig Dispense Refill   acetaminophen  (TYLENOL ) 500 MG tablet Take 2 tablets (1,000 mg total) by mouth every 8 (eight) hours as needed for mild pain (pain score 1-3). 30 tablet 0   Calcium  Carbonate-Vitamin D (CALCIUM  600 + D PO) Take 1 tablet by mouth 2 (two) times daily.       donepezil  (ARICEPT ) 5 MG tablet Take 1 tablet by mouth at bedtime.     feeding supplement (ENSURE ENLIVE / ENSURE PLUS) LIQD Take 237 mLs by mouth 2 (two) times daily between meals.     fish oil-omega-3 fatty acids  1000 MG capsule Take 1 capsule (1 g total) by mouth daily.     fluticasone (FLONASE) 50 MCG/ACT nasal spray Place 2 sprays into both nostrils daily as needed for allergies.     gabapentin  (NEURONTIN ) 100 MG capsule Take 1 capsule (100 mg total) by mouth 3 (three) times daily. 90 capsule 0   hydroxychloroquine (PLAQUENIL) 200 MG tablet Take 200 mg by mouth daily.     ibuprofen  (ADVIL ) 600 MG tablet Take 1 tablet (600 mg total) by mouth every 8 (eight) hours as needed for up to 15 days for moderate pain (pain score 4-6) or mild pain (pain score 1-3). 45 tablet 0   lovastatin (MEVACOR) 20 MG tablet Take 20 mg by mouth at bedtime.       methocarbamol  (ROBAXIN ) 500 MG tablet Take 1 tablet (500 mg total) by mouth 3 (three) times daily as needed for up to 15 days for muscle spasms. 45 tablet 0   omeprazole (PRILOSEC) 40 MG capsule Take 40 mg by mouth in the morning and at bedtime.     oxyCODONE  (ROXICODONE ) 5 MG immediate release tablet Take 1 tablet (5 mg total) by mouth every 6 (six) hours as needed for severe pain (pain score 7-10) or breakthrough pain. 15 tablet 0   sodium chloride  flush 0.9 % SOLN injection Use 10 mL flush as directed as needed. 300 mL 1   sucralfate (CARAFATE) 1 g tablet Take 1 g by mouth 2 (two) times daily.     torsemide (DEMADEX) 20  MG tablet Take 30 mg by mouth daily.     venlafaxine  XR (EFFEXOR -XR) 75 MG 24 hr capsule Take 75 mg by mouth daily.     No current facility-administered medications for this visit.    OBJECTIVE: Vitals:   10/27/23 1100  BP: (!) 108/57  Pulse: 90  Resp: 16  Temp: 98.3 F (36.8 C)  SpO2: 100%     Body mass index is 14.74 kg/m.    ECOG FS:0 - Asymptomatic  General: Well-developed, well-nourished, no acute distress. Eyes: Pink conjunctiva, anicteric sclera. HEENT: Normocephalic, moist mucous membranes. Lungs: No audible wheezing or coughing. Heart: Regular rate and rhythm. Abdomen: Soft, nontender, no obvious distention. Musculoskeletal: No edema, cyanosis, or clubbing. Neuro: Alert, answering all questions appropriately. Cranial nerves grossly intact. Skin: No rashes or petechiae noted. Psych: Normal affect. Lymphatics: No cervical, calvicular, axillary or inguinal LAD.   LAB RESULTS:  Lab Results  Component  Value Date   NA 141 10/20/2023   K 4.8 10/20/2023   CL 109 10/20/2023   CO2 22 10/20/2023   GLUCOSE 105 (H) 10/20/2023   BUN 12 10/20/2023   CREATININE 1.18 (H) 10/20/2023   CALCIUM  8.6 (L) 10/20/2023   PROT 7.1 10/16/2023   ALBUMIN  3.1 (L) 10/16/2023   AST 27 10/16/2023   ALT 15 10/16/2023   ALKPHOS 47 10/16/2023   BILITOT 0.5 10/16/2023   GFRNONAA 47 (L) 10/20/2023   GFRAA 59 (L) 09/30/2015    Lab Results  Component Value Date   WBC 5.2 10/20/2023   NEUTROABS 8.1 (H) 10/06/2023   HGB 10.5 (L) 10/20/2023   HCT 34.7 (L) 10/20/2023   MCV 94.8 10/20/2023   PLT 462 (H) 10/20/2023   Lab Results  Component Value Date   IRON  7 (L) 10/08/2023   TIBC 139 (L) 10/08/2023   IRONPCTSAT 5 (L) 10/08/2023   Lab Results  Component Value Date   FERRITIN 339 (H) 10/08/2023     STUDIES: CT GUIDED PERITONEAL/RETROPERITONEAL FLUID DRAIN BY PERC CATH Result Date: 10/18/2023 INDICATION: POSTOP POSTERIOR DEPENDENT PELVIC FLUID COLLECTION EXAM: CT DRAINAGE PELVIC  FLUID COLLECTION VIA RIGHT TRANS GLUTEAL APPROACH TECHNIQUE: Multidetector CT imaging of the PELVIS was performed following the standard protocol without IV contrast. RADIATION DOSE REDUCTION: This exam was performed according to the departmental dose-optimization program which includes automated exposure control, adjustment of the mA and/or kV according to patient size and/or use of iterative reconstruction technique. MEDICATIONS: The patient is currently admitted to the hospital and receiving intravenous antibiotics. The antibiotics were administered within an appropriate time frame prior to the initiation of the procedure. ANESTHESIA/SEDATION: Moderate (conscious) sedation was employed during this procedure. A total of Versed  1.0 mg and Fentanyl  25 mcg was administered intravenously by the radiology nurse. Total intra-service moderate Sedation Time: 15 minutes. The patient's level of consciousness and vital signs were monitored continuously by radiology nursing throughout the procedure under my direct supervision. COMPLICATIONS: None immediate. PROCEDURE: Informed written consent was obtained from the patient after a thorough discussion of the procedural risks, benefits and alternatives. All questions were addressed. Maximal Sterile Barrier Technique was utilized including caps, mask, sterile gowns, sterile gloves, sterile drape, hand hygiene and skin antiseptic. A timeout was performed prior to the initiation of the procedure. Previous imaging reviewed. Patient position prone. Noncontrast localization CT performed. The dependent pelvic fluid collection was localized and marked for a right trans gluteal approach. Under sterile conditions and local anesthesia, the 18 gauge 10 cm access needle was advanced from a medial right trans gluteal approach into the fluid collection. Needle position confirmed with CT. Syringe aspiration yielded dark thin bloody fluid compatible with chronic hematoma. Sample sent for  culture. Guidewire inserted followed by tract dilatation to insert a 10 [place] drain. Drain catheter position confirmed with CT. Catheter secured with a silk suture and a sterile dressing. Suction bulb applied. No immediate complication. Patient tolerated the procedure well. IMPRESSION: Successful CT-guided right transgluteal pelvic fluid collection drain procedure. Electronically Signed   By: Melven Stable.  Shick M.D.   On: 10/18/2023 10:32   CT ABDOMEN PELVIS W CONTRAST Result Date: 10/16/2023 CLINICAL DATA:  Right lower abdominal pain. Recent abdominal surgery with colostomy. EXAM: CT ABDOMEN AND PELVIS WITH CONTRAST TECHNIQUE: Multidetector CT imaging of the abdomen and pelvis was performed using the standard protocol following bolus administration of intravenous contrast. RADIATION DOSE REDUCTION: This exam was performed according to the departmental dose-optimization program which includes automated exposure control,  adjustment of the mA and/or kV according to patient size and/or use of iterative reconstruction technique. CONTRAST:  75mL OMNIPAQUE  IOHEXOL  300 MG/ML  SOLN COMPARISON:  Most recent CT 10/06/2023 FINDINGS: Lower chest: Large hiatal hernia. No basilar airspace disease or pleural effusion. Hepatobiliary: Unremarkable appearance of the liver. Cholecystectomy without biliary dilatation. Pancreas: No ductal dilatation or inflammation. Spleen: Normal in size without focal abnormality. Adrenals/Urinary Tract: Normal adrenal glands. No hydronephrosis or renal calculi. No renal inflammation. Small hypodense renal lesions are likely cysts, no specific imaging follow-up is recommended. Unremarkable urinary bladder. Stomach/Bowel: Bowel assessment is limited in the absence of enteric contrast and paucity of intra-abdominal fat. Large hiatal hernia with the entire stomach being intrathoracic, no change from prior exam. Descending colostomy. Stapled off sigmoid colon in the pelvis. Improved wall thickening of the  sigmoid colon and rectum. The remainder of the colon is decompressed and not well assessed. No abnormal colonic distension no small bowel distension or evidence of obstruction/ileus. There is mild small bowel wall thickening involving pelvic bowel loops. Vascular/Lymphatic: Aortic atherosclerosis. No aneurysm. The portal vein is patent. No bulky abdominopelvic adenopathy. Reproductive: Status post hysterectomy. No adnexal masses. Other: Fluid collection within the deep pelvis posterior to the right of midline measures 7.7 x 3.4 x 4 cm, series 2, image 51. This has shifted in location from prior exam where a fluid collection to the left of midline measured 4.7 x 2.4 x 1.9 cm. There is mild peripheral enhancement. No internal gas or enteric contrast. No surrounding fat stranding. Midline skin staples in place. No free intra-abdominal air. Musculoskeletal: Chronic scoliosis and degenerative change. There are no acute or suspicious osseous abnormalities. IMPRESSION: 1. Fluid collection within the deep pelvis posterior to the right of midline measures 7.7 x 3.4 x 4 cm. This has shifted in location from prior exam orbits to the left of midline, and increase in size. Findings are suspicious for abscess. 2. Descending colostomy. Stapled off sigmoid colon in the pelvis. Improved wall thickening of the sigmoid colon and rectum. 3. Mild small bowel wall thickening involving pelvic bowel loops, favored to be reactive adjacent to pelvic collection. 4. Large hiatal hernia with the entire stomach being intrathoracic, no change from prior exam. Aortic Atherosclerosis (ICD10-I70.0). Electronically Signed   By: Chadwick Colonel M.D.   On: 10/16/2023 20:46   CT ABDOMEN PELVIS W WO CONTRAST Result Date: 10/06/2023 CLINICAL DATA:  Abdominal pain. Sigmoid colon stricture. Difficulty eating. Tender abdomen. Anemia. EXAM: CT ABDOMEN AND PELVIS WITHOUT AND WITH CONTRAST TECHNIQUE: Multidetector CT imaging of the abdomen and pelvis was  performed following the standard protocol before and following the bolus administration of intravenous contrast. RADIATION DOSE REDUCTION: This exam was performed according to the departmental dose-optimization program which includes automated exposure control, adjustment of the mA and/or kV according to patient size and/or use of iterative reconstruction technique. CONTRAST:  75mL OMNIPAQUE  IOHEXOL  300 MG/ML  SOLN COMPARISON:  08/31/2023 FINDINGS: Lower chest: Very large hiatal hernia containing the majority of the stomach. Passive atelectasis in the left lower lobe. Descending thoracic aortic atherosclerosis. Hepatobiliary: Cholecystectomy.  Otherwise unremarkable. Pancreas: Unremarkable Spleen: Unremarkable Adrenals/Urinary Tract: Subcentimeter hypodense lesions in the kidneys are too small to characterize although statistically likely to be benign. No further imaging workup of these lesions is indicated. Adrenal glands unremarkable. Stomach/Bowel: The stomach is mainly intrathoracic and only partially included on today's CT of the abdomen. Prominent wall thickening of the sigmoid colon and rectum circumferentially and diffusely with scattered sigmoid colon diverticula  which do not appear significantly inflamed themselves. On image 54 of series 10 we demonstrate a rim enhancing 4.7 by 2.4 by 1.9 cm (volume = 11 cm^3) lesion nestled along the loops of sigmoid colon and rectum not containing internal oral contrast medium and accordingly raising suspicion for abscess. This was not definitively present on 08/31/2023 and accordingly tumor (which could appear identical to this) is considered less likely. No internal gas within this structure. This sits along the posterior margin of vaginal cuff. The colon proximal to this inflamed rectosigmoid segment is moderately diffusely distended with cecal diameter at 7.6 cm in diameter and a representative segment of the transverse colon and 5.2 cm. However, the small bowel does  not appear dilated. Vascular/Lymphatic: Atherosclerosis is present, including aortoiliac atherosclerotic disease. There is substantial narrowing of the proximal celiac trunk on image 49 series 11 due to atheromatous vascular calcification and median arcuate ligament. 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 Neither vessel appears completely occluded. No pathologic adenopathy observed. Reproductive: Uterus absent. Other: Trace ascites along the inferior right hepatic lobe margin as on image 28 series 7. Musculoskeletal: Mild dextroconvex lumbar scoliosis with rotary component. Lumbar spondylosis and degenerative disc disease with resulting mild to moderate multilevel impingement in the mid to lower lumbar spine. IMPRESSION: 1. Prominent wall thickening of the sigmoid colon and rectum circumferentially and diffusely with scattered sigmoid colon diverticula which do not appear significantly inflamed themselves. This is compatible with colitis/proctitis. 2. There is a rim enhancing 4.7 by 2.4 by 1.9 cm (volume = 11 cc) lesion along the posterior margin of the vaginal cuff, not containing internal oral contrast medium and accordingly raising suspicion for abscess. This was not definitively present on 08/31/2023 and accordingly tumor (which could appear identical to this) is considered less likely. This presumed abscess is in close proximity to the rectosigmoid. 3. The colon proximal to this inflamed rectosigmoid segment is moderately diffusely distended with cecal diameter at 7.6 cm in diameter and a representative segment of the transverse colon at 5.2 cm. However, the small bowel does not appear dilated. 4. Very large hiatal hernia containing the majority of the stomach. 5. Trace ascites along the inferior right hepatic lobe margin. 6. Substantial narrowing of the proximal celiac trunk due to atheromatous vascular calcification and median arcuate ligament.  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 Aortic Atherosclerosis (ICD10-I70.0). 7. Mild dextroconvex lumbar scoliosis with rotary component. Lumbar spondylosis and degenerative disc disease with resulting mild to moderate multilevel impingement in the mid to lower lumbar spine. Electronically Signed   By: Freida Jes M.D.   On: 10/06/2023 14:36    ASSESSMENT: Iron  deficiency anemia.  PLAN:    Iron  deficiency anemia: Patient's hemoglobin has significantly improved since her admission to the hospital.  During her inpatient stay she received 1 unit of packed red blood cells as well as 1 infusion of IV Venofer .  Colonoscopy on July 21, 2023 did not reveal any significant pathology, but procedure was aborted due to restricted mobility of the colon.  EGD on the same date did not reveal any pathology.  Plan was to do a virtual endoscopy, but we do not have these results in hand.  Return to clinic 4 times over the next 2 weeks to receive 200 mg IV Venofer .  Patient will then return to clinic in 4 months with repeat laboratory work, further evaluation, and continuation of treatment if needed. Intra-abdominal abscess:  Resolved.  I spent a total of 45 minutes reviewing chart data, face-to-face evaluation with the patient, counseling and coordination of care as detailed above.   Patient expressed understanding and was in agreement with this plan. She also understands that She can call clinic at any time with any questions, concerns, or complaints.    Shellie Dials, MD   10/27/2023 11:40 AM

## 2023-10-28 ENCOUNTER — Encounter: Payer: Self-pay | Admitting: Oncology

## 2023-10-28 ENCOUNTER — Ambulatory Visit
Admission: RE | Admit: 2023-10-28 | Discharge: 2023-10-28 | Disposition: A | Discharge status: Home / Self Care | Source: Ambulatory Visit | Attending: General Surgery | Admitting: General Surgery

## 2023-10-28 ENCOUNTER — Ambulatory Visit
Admission: RE | Admit: 2023-10-28 | Discharge: 2023-10-28 | Disposition: A | Discharge status: Home / Self Care | Source: Ambulatory Visit | Attending: Urology | Admitting: Urology

## 2023-10-28 DIAGNOSIS — K651 Peritoneal abscess: Secondary | ICD-10-CM

## 2023-10-28 HISTORY — PX: IR RADIOLOGIST EVAL & MGMT: IMG5224

## 2023-10-28 MED ORDER — IOPAMIDOL (ISOVUE-370) INJECTION 76%
80.0000 mL | Freq: Once | INTRAVENOUS | Status: AC | PRN
  Administered 2023-10-28: 65 mL via INTRAVENOUS

## 2023-10-28 NOTE — Progress Notes (Signed)
 Chief Complaint: Patient was seen in consultation today for pelvic abscess follow up at the request of Carim,Charles A  Referring Physician(s): Michial Akin, [place]  Supervising Physician: Erica Hau  History of Present Illness: Vanessa Cisneros is a 78 y.o. female with past medical history significant for arthritis, GERD, HTN, CHF, anemia and colitis with diverticulitis with abscess s/p Hartmann's 10/08/23 and right transgluteal drain placement 10/18/23 in IR who presents today for drain follow up.  At time of IR drain placement it was noted that the fluid appeared to be more consistent with chronic hematoma. Culture of aspirate was ultimately negative.   Vanessa Cisneros denies any complaints, she is hopeful the drain can be removed today. She continues to flush with 5 cc every day and is having minimal clear/light yellow output in her bulb. On today's exam she reports not emptying the bulb for 3 days and there is approximately 5 cc of clear fluid present. She completed antibiotics about a week ago and has had no fevers, abdominal pain, n/v, chills or diarrhea.   Past Medical History:  Diagnosis Date   Anemia    Arthritis    CHF (congestive heart failure) (HCC)    Depression    Esophageal reflux    GERD (gastroesophageal reflux disease)    Hyperlipidemia, mixed    Immunodeficiency (cell-mediated) (HCC)    Inflammatory polyarthropathy (HCC)    Lumbar stenosis with neurogenic claudication     Past Surgical History:  Procedure Laterality Date   ABDOMINAL HYSTERECTOMY     BREAST BIOPSY Left 06/08/2012   neg   CHOLECYSTECTOMY     COLON RESECTION SIGMOID N/A 10/08/2023   Procedure: COLECTOMY, SIGMOID, OPEN;  Surgeon: Adalberto Acton, MD;  Location: [facility] OR;  Service: General;  Laterality: N/A;   COLONOSCOPY WITH PROPOFOL  N/A 07/21/2023   Procedure: COLONOSCOPY WITH PROPOFOL ;  Surgeon: Toledo, Alphonsus Jeans, MD;  Location: [facility] ENDOSCOPY;  Service: Gastroenterology;  Laterality:  N/A;   COLOSTOMY N/A 10/08/2023   Procedure: CREATION, COLOSTOMY;  Surgeon: Adalberto Acton, MD;  Location: [facility] OR;  Service: General;  Laterality: N/A;   ESOPHAGOGASTRODUODENOSCOPY (EGD) WITH PROPOFOL  N/A 10/14/2015   Procedure: ESOPHAGOGASTRODUODENOSCOPY (EGD) WITH PROPOFOL ;  Surgeon: Cassie Click, MD;  Location: [facility] ENDOSCOPY;  Service: Endoscopy;  Laterality: N/A;   ESOPHAGOGASTRODUODENOSCOPY (EGD) WITH PROPOFOL  N/A 07/21/2023   Procedure: ESOPHAGOGASTRODUODENOSCOPY (EGD) WITH PROPOFOL ;  Surgeon: Toledo, Alphonsus Jeans, MD;  Location: [facility] ENDOSCOPY;  Service: Gastroenterology;  Laterality: N/A;   LAPAROTOMY N/A 10/08/2023   Procedure: LAPAROTOMY, EXPLORATORY;  Surgeon: Adalberto Acton, MD;  Location: [facility] OR;  Service: General;  Laterality: N/A;  [place] Intra-abdominal Abscess   TUMOR REMOVAL N/A    stomach    Allergies: Levofloxacin and Tramadol  Medications: Prior to Admission medications   Medication Sig Start Date End Date Taking? Authorizing Provider  acetaminophen  (TYLENOL ) 500 MG tablet Take 2 tablets (1,000 mg total) by mouth every 8 (eight) hours as needed for mild pain (pain score 1-3). 10/20/23   Pokhrel, Amador Bad, MD  Calcium  Carbonate-Vitamin D (CALCIUM  600 + D PO) Take 1 tablet by mouth 2 (two) times daily.      [provider]  donepezil  (ARICEPT ) 5 MG tablet Take 1 tablet by mouth at bedtime. 07/19/23 07/18/24  [provider]  feeding supplement (ENSURE ENLIVE / ENSURE PLUS) LIQD Take 237 mLs by mouth 2 (two) times daily between meals. 10/11/23   Gonfa, Taye T, MD  fish oil-omega-3 fatty acids  1000 MG capsule Take  1 capsule (1 g total) by mouth daily. 10/11/23   Gonfa, Taye T, MD  fluticasone (FLONASE) 50 MCG/ACT nasal spray Place 2 sprays into both nostrils daily as needed for allergies. 07/07/23   [provider]  gabapentin  (NEURONTIN ) 100 MG capsule Take 1 capsule (100 mg total) by mouth 3 (three) times daily. 10/20/23 11/19/23  Pokhrel, Laxman, MD   hydroxychloroquine (PLAQUENIL) 200 MG tablet Take 200 mg by mouth daily.    [provider]  ibuprofen  (ADVIL ) 600 MG tablet Take 1 tablet (600 mg total) by mouth every 8 (eight) hours as needed for up to 15 days for moderate pain (pain score 4-6) or mild pain (pain score 1-3). 10/20/23 11/04/23  Pokhrel, Laxman, MD  lovastatin (MEVACOR) 20 MG tablet Take 20 mg by mouth at bedtime.      [provider]  methocarbamol  (ROBAXIN ) 500 MG tablet Take 1 tablet (500 mg total) by mouth 3 (three) times daily as needed for up to 15 days for muscle spasms. 10/20/23 11/04/23  Pokhrel, Laxman, MD  omeprazole (PRILOSEC) 40 MG capsule Take 40 mg by mouth in the morning and at bedtime.    [provider]  oxyCODONE  (ROXICODONE ) 5 MG immediate release tablet Take 1 tablet (5 mg total) by mouth every 6 (six) hours as needed for severe pain (pain score 7-10) or breakthrough pain. 10/20/23   Pokhrel, Amador Bad, MD  sodium chloride  flush 0.9 % SOLN injection Use 10 mL flush as directed as needed. 10/20/23 11/19/23  Carim, Charles A, PA-C  sucralfate (CARAFATE) 1 g tablet Take 1 g by mouth 2 (two) times daily.    [provider]  torsemide (DEMADEX) 20 MG tablet Take 30 mg by mouth daily. 09/04/23   [provider]  venlafaxine  XR (EFFEXOR -XR) 75 MG 24 hr capsule Take 75 mg by mouth daily.    [provider]     No family history on file.  Social History   Socioeconomic History   Marital status: Widowed    Spouse name: Not on file   Number of children: Not on file   Years of education: Not on file   Highest education level: Not on file  Occupational History   Not on file  Tobacco Use   Smoking status: Never   Smokeless tobacco: Not on file  Vaping Use   Vaping status: Never Used  Substance and Sexual Activity   Alcohol use: No   Drug use: Never   Sexual activity: Not on file  Other Topics Concern   Not on file  Social History Narrative   Not on file    Social Drivers of Health   Financial Resource Strain: Low Risk  (08/09/2023)   Received from [facility]   Overall Financial Resource Strain (CARDIA)    Difficulty of Paying Living Expenses: Not hard at all  Food Insecurity: No Food Insecurity (10/17/2023)   Hunger Vital Sign    Worried About Running Out of Food in the Last Year: Never true    Ran Out of Food in the Last Year: Never true  Transportation Needs: No Transportation Needs (10/17/2023)   PRAPARE - Administrator, Civil Service (Medical): No    Lack of Transportation (Non-Medical): No  Physical Activity: Not on file  Stress: Not on file  Social Connections: Moderately Isolated (10/17/2023)   Social Connection and Isolation Panel [NHANES]    Frequency of Communication with Friends and Family: More than three times a week  Frequency of Social Gatherings with Friends and Family: Twice a week    Attends Religious Services: More than 4 times per year    Active Member of [employer] or Organizations: No    Attends Banker Meetings: Never    Marital Status: Widowed    Review of Systems: A 12 point ROS discussed and pertinent positives are indicated in the HPI above.  All other systems are negative.  Review of Systems  Constitutional:  Negative for chills and fever.  Respiratory:  Negative for cough and shortness of breath.   Cardiovascular:  Negative for chest pain.  Gastrointestinal:  Negative for abdominal distention, abdominal pain, diarrhea, nausea and vomiting.  Musculoskeletal:  Negative for back pain.  Neurological:  Negative for dizziness and syncope.    Vital Signs: There were no vitals taken for this visit.  Physical Exam Constitutional:      General: She is not in acute distress. HENT:     Head: Normocephalic.  Pulmonary:     Effort: Pulmonary effort is normal.  Abdominal:     Palpations: Abdomen is soft.     Comments: (+) right TG drain to suction bulb with ~5 cc  clear fluid (appears to be flush) present in bulb. Insertion site clean, dry, dressed appropriately. Drain injection confirms no fistula and drain was removed in tact without complication.  Skin:    General: Skin is warm and dry.  Neurological:     Mental Status: She is alert and oriented to person, place, and time.  Psychiatric:        Mood and Affect: Mood normal.        Behavior: Behavior normal.        Thought Content: Thought content normal.        Judgment: Judgment normal.      Imaging: CT GUIDED PERITONEAL/RETROPERITONEAL FLUID DRAIN BY PERC CATH Result Date: 10/18/2023 INDICATION: POSTOP POSTERIOR DEPENDENT PELVIC FLUID COLLECTION EXAM: CT DRAINAGE PELVIC FLUID COLLECTION VIA RIGHT TRANS GLUTEAL APPROACH TECHNIQUE: Multidetector CT imaging of the PELVIS was performed following the standard protocol without IV contrast. RADIATION DOSE REDUCTION: This exam was performed according to the departmental dose-optimization program which includes automated exposure control, adjustment of the mA and/or kV according to patient size and/or use of iterative reconstruction technique. MEDICATIONS: The patient is currently admitted to the hospital and receiving intravenous antibiotics. The antibiotics were administered within an appropriate time frame prior to the initiation of the procedure. ANESTHESIA/SEDATION: Moderate (conscious) sedation was employed during this procedure. A total of Versed  1.0 mg and Fentanyl  25 mcg was administered intravenously by the radiology nurse. Total intra-service moderate Sedation Time: 15 minutes. The patient's level of consciousness and vital signs were monitored continuously by radiology nursing throughout the procedure under my direct supervision. COMPLICATIONS: None immediate. PROCEDURE: Informed written consent was obtained from the patient after a thorough discussion of the procedural risks, benefits and alternatives. All questions were addressed. Maximal Sterile  Barrier Technique was utilized including caps, mask, sterile gowns, sterile gloves, sterile drape, hand hygiene and skin antiseptic. A timeout was performed prior to the initiation of the procedure. Previous imaging reviewed. Patient position prone. Noncontrast localization CT performed. The dependent pelvic fluid collection was localized and marked for a right trans gluteal approach. Under sterile conditions and local anesthesia, the 18 gauge 10 cm access needle was advanced from a medial right trans gluteal approach into the fluid collection. Needle position confirmed with CT. Syringe aspiration yielded dark thin bloody fluid compatible with  chronic hematoma. Sample sent for culture. Guidewire inserted followed by tract dilatation to insert a 10 [place] drain. Drain catheter position confirmed with CT. Catheter secured with a silk suture and a sterile dressing. Suction bulb applied. No immediate complication. Patient tolerated the procedure well. IMPRESSION: Successful CT-guided right transgluteal pelvic fluid collection drain procedure. Electronically Signed   By: Melven Stable.  Shick M.D.   On: 10/18/2023 10:32   CT ABDOMEN PELVIS W CONTRAST Result Date: 10/16/2023 CLINICAL DATA:  Right lower abdominal pain. Recent abdominal surgery with colostomy. EXAM: CT ABDOMEN AND PELVIS WITH CONTRAST TECHNIQUE: Multidetector CT imaging of the abdomen and pelvis was performed using the standard protocol following bolus administration of intravenous contrast. RADIATION DOSE REDUCTION: This exam was performed according to the departmental dose-optimization program which includes automated exposure control, adjustment of the mA and/or kV according to patient size and/or use of iterative reconstruction technique. CONTRAST:  75mL OMNIPAQUE  IOHEXOL  300 MG/ML  SOLN COMPARISON:  Most recent CT 10/06/2023 FINDINGS: Lower chest: Large hiatal hernia. No basilar airspace disease or pleural effusion. Hepatobiliary: Unremarkable appearance of the  liver. Cholecystectomy without biliary dilatation. Pancreas: No ductal dilatation or inflammation. Spleen: Normal in size without focal abnormality. Adrenals/Urinary Tract: Normal adrenal glands. No hydronephrosis or renal calculi. No renal inflammation. Small hypodense renal lesions are likely cysts, no specific imaging follow-up is recommended. Unremarkable urinary bladder. Stomach/Bowel: Bowel assessment is limited in the absence of enteric contrast and paucity of intra-abdominal fat. Large hiatal hernia with the entire stomach being intrathoracic, no change from prior exam. Descending colostomy. Stapled off sigmoid colon in the pelvis. Improved wall thickening of the sigmoid colon and rectum. The remainder of the colon is decompressed and not well assessed. No abnormal colonic distension no small bowel distension or evidence of obstruction/ileus. There is mild small bowel wall thickening involving pelvic bowel loops. Vascular/Lymphatic: Aortic atherosclerosis. No aneurysm. The portal vein is patent. No bulky abdominopelvic adenopathy. Reproductive: Status post hysterectomy. No adnexal masses. Other: Fluid collection within the deep pelvis posterior to the right of midline measures 7.7 x 3.4 x 4 cm, series 2, image 51. This has shifted in location from prior exam where a fluid collection to the left of midline measured 4.7 x 2.4 x 1.9 cm. There is mild peripheral enhancement. No internal gas or enteric contrast. No surrounding fat stranding. Midline skin staples in place. No free intra-abdominal air. Musculoskeletal: Chronic scoliosis and degenerative change. There are no acute or suspicious osseous abnormalities. IMPRESSION: 1. Fluid collection within the deep pelvis posterior to the right of midline measures 7.7 x 3.4 x 4 cm. This has shifted in location from prior exam orbits to the left of midline, and increase in size. Findings are suspicious for abscess. 2. Descending colostomy. Stapled off sigmoid colon in  the pelvis. Improved wall thickening of the sigmoid colon and rectum. 3. Mild small bowel wall thickening involving pelvic bowel loops, favored to be reactive adjacent to pelvic collection. 4. Large hiatal hernia with the entire stomach being intrathoracic, no change from prior exam. Aortic Atherosclerosis (ICD10-I70.0). Electronically Signed   By: Chadwick Colonel M.D.   On: 10/16/2023 20:46   CT ABDOMEN PELVIS W WO CONTRAST Result Date: 10/06/2023 CLINICAL DATA:  Abdominal pain. Sigmoid colon stricture. Difficulty eating. Tender abdomen. Anemia. EXAM: CT ABDOMEN AND PELVIS WITHOUT AND WITH CONTRAST TECHNIQUE: Multidetector CT imaging of the abdomen and pelvis was performed following the standard protocol before and following the bolus administration of intravenous contrast. RADIATION DOSE REDUCTION: This exam was  performed according to the departmental dose-optimization program which includes automated exposure control, adjustment of the mA and/or kV according to patient size and/or use of iterative reconstruction technique. CONTRAST:  75mL OMNIPAQUE  IOHEXOL  300 MG/ML  SOLN COMPARISON:  08/31/2023 FINDINGS: Lower chest: Very large hiatal hernia containing the majority of the stomach. Passive atelectasis in the left lower lobe. Descending thoracic aortic atherosclerosis. Hepatobiliary: Cholecystectomy.  Otherwise unremarkable. Pancreas: Unremarkable Spleen: Unremarkable Adrenals/Urinary Tract: Subcentimeter hypodense lesions in the kidneys are too small to characterize although statistically likely to be benign. No further imaging workup of these lesions is indicated. Adrenal glands unremarkable. Stomach/Bowel: The stomach is mainly intrathoracic and only partially included on today's CT of the abdomen. Prominent wall thickening of the sigmoid colon and rectum circumferentially and diffusely with scattered sigmoid colon diverticula which do not appear significantly inflamed themselves. On image 54 of series 10  we demonstrate a rim enhancing 4.7 by 2.4 by 1.9 cm (volume = 11 cm^3) lesion nestled along the loops of sigmoid colon and rectum not containing internal oral contrast medium and accordingly raising suspicion for abscess. This was not definitively present on 08/31/2023 and accordingly tumor (which could appear identical to this) is considered less likely. No internal gas within this structure. This sits along the posterior margin of vaginal cuff. The colon proximal to this inflamed rectosigmoid segment is moderately diffusely distended with cecal diameter at 7.6 cm in diameter and a representative segment of the transverse colon and 5.2 cm. However, the small bowel does not appear dilated. Vascular/Lymphatic: Atherosclerosis is present, including aortoiliac atherosclerotic disease. There is substantial narrowing of the proximal celiac trunk on image 49 series 11 due to atheromatous vascular calcification and median arcuate ligament. 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 Neither vessel appears completely occluded. No pathologic adenopathy observed. Reproductive: Uterus absent. Other: Trace ascites along the inferior right hepatic lobe margin as on image 28 series 7. Musculoskeletal: Mild dextroconvex lumbar scoliosis with rotary component. Lumbar spondylosis and degenerative disc disease with resulting mild to moderate multilevel impingement in the mid to lower lumbar spine. IMPRESSION: 1. Prominent wall thickening of the sigmoid colon and rectum circumferentially and diffusely with scattered sigmoid colon diverticula which do not appear significantly inflamed themselves. This is compatible with colitis/proctitis. 2. There is a rim enhancing 4.7 by 2.4 by 1.9 cm (volume = 11 cc) lesion along the posterior margin of the vaginal cuff, not containing internal oral contrast medium and accordingly raising suspicion for abscess. This was not definitively present  on 08/31/2023 and accordingly tumor (which could appear identical to this) is considered less likely. This presumed abscess is in close proximity to the rectosigmoid. 3. The colon proximal to this inflamed rectosigmoid segment is moderately diffusely distended with cecal diameter at 7.6 cm in diameter and a representative segment of the transverse colon at 5.2 cm. However, the small bowel does not appear dilated. 4. Very large hiatal hernia containing the majority of the stomach. 5. Trace ascites along the inferior right hepatic lobe margin. 6. Substantial narrowing of the proximal celiac trunk due to atheromatous vascular calcification and median arcuate ligament. 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 Aortic Atherosclerosis (ICD10-I70.0). 7. Mild dextroconvex lumbar scoliosis with rotary component. Lumbar spondylosis and degenerative disc disease with resulting mild to moderate multilevel impingement in the mid to lower lumbar spine. Electronically Signed   By: Freida Jes M.D.   On: 10/06/2023 14:36  Labs:  CBC: Recent Labs    10/17/23 0557 10/18/23 0406 10/19/23 0425 10/20/23 0844  WBC 4.7 4.8 4.6 5.2  HGB 6.9* 10.0* 9.3* 10.5*  HCT 22.6* 33.1* 29.9* 34.7*  PLT 395 411* 415* 462*    COAGS: Recent Labs    09/01/23 0550  INR 1.1    BMP: Recent Labs    10/17/23 1602 10/18/23 0406 10/19/23 0425 10/20/23 0359  NA 140 140 139 141  K 3.8 3.6 3.8 4.8  CL 104 108 108 109  CO2 23 18* 23 22  GLUCOSE 81 53* 92 105*  BUN 9 11 10 12   CALCIUM  8.8* 8.7* 8.6* 8.6*  CREATININE 0.99 1.02* 0.94 1.18*  GFRNONAA 58* 56* >60 47*    LIVER FUNCTION TESTS: Recent Labs    08/31/23 2144 10/06/23 1812 10/16/23 1559  BILITOT 0.5 0.5 0.5  AST 24 20 27   ALT 17 13 15   ALKPHOS 43 49 47  PROT 7.2 6.4* 7.1  ALBUMIN  3.9 2.9* 3.1*    TUMOR MARKERS: No results for input(s): "AFPTM", "CEA", "CA199", "CHROMGRNA" in the last  8760 hours.  Assessment:  78 y/o F with history of colitis/diverticulitis with abscess s/p Hartmann's 10/08/23 and right TG drain placement in IR 10/18/23 for post operative abscess who presents today for drain follow up.  Vanessa Cisneros reports no concerning s/s, output has been < 10 cc/24 hours for over 1 week, output is clear/light yellow. Culture from aspirate was negative.  Drain injection today shows no fistula and decision was made to remove the drain.   No further IR follow up needed at this time, patient encouraged to follow up with general surgery/PCP as needed.  Electronically Signed: Marilu Shown PA-C 10/28/2023, 9:34 AM   Please refer to Dr. Cleatis Curio attestation of this note for management and plan.

## 2023-11-02 ENCOUNTER — Inpatient Hospital Stay

## 2023-11-02 VITALS — BP 104/61 | HR 87 | Temp 97.3°F | Resp 18

## 2023-11-02 DIAGNOSIS — D500 Iron deficiency anemia secondary to blood loss (chronic): Secondary | ICD-10-CM

## 2023-11-02 DIAGNOSIS — D509 Iron deficiency anemia, unspecified: Secondary | ICD-10-CM | POA: Diagnosis not present

## 2023-11-02 MED ORDER — IRON SUCROSE 20 MG/ML IV SOLN
200.0000 mg | Freq: Once | INTRAVENOUS | Status: AC
  Administered 2023-11-02: 200 mg via INTRAVENOUS

## 2023-11-02 NOTE — Patient Instructions (Signed)

## 2023-11-04 ENCOUNTER — Inpatient Hospital Stay

## 2023-11-05 ENCOUNTER — Inpatient Hospital Stay

## 2023-11-05 VITALS — BP 114/44 | HR 88 | Temp 97.0°F | Resp 18

## 2023-11-05 DIAGNOSIS — D500 Iron deficiency anemia secondary to blood loss (chronic): Secondary | ICD-10-CM

## 2023-11-05 DIAGNOSIS — D509 Iron deficiency anemia, unspecified: Secondary | ICD-10-CM | POA: Diagnosis not present

## 2023-11-05 MED ORDER — SODIUM CHLORIDE 0.9% FLUSH
10.0000 mL | Freq: Once | INTRAVENOUS | Status: AC | PRN
  Administered 2023-11-05: 10 mL
  Filled 2023-11-05: qty 10

## 2023-11-05 MED ORDER — IRON SUCROSE 20 MG/ML IV SOLN
200.0000 mg | Freq: Once | INTRAVENOUS | Status: AC
  Administered 2023-11-05: 200 mg via INTRAVENOUS

## 2023-11-05 NOTE — Progress Notes (Signed)
 Patient declined to wait the 30 minutes for post iron infusion observation today. Tolerated infusion well. VSS.

## 2023-11-08 NOTE — ED Provider Notes (Signed)
 [facility] 4E CV SURGICAL PROGRESSIVE CARE Provider Note   CSN: [id number] Arrival date & time: 10/17/23  0308     History  No chief complaint on file.   Vanessa Cisneros is a 78 y.o. female.  The history is provided by the patient.  She has history of chronic kidney disease, and recently had partial sigmoid colectomy with colostomy for diverticulitis and was transferred from [facility] because of CT scan which showed increasing abscess formation.  She had just been discharged from the hospital 5 days ago.   Home Medications Prior to Admission medications   Medication Sig Start Date End Date Taking? Authorizing Provider  Calcium  Carbonate-Vitamin D (CALCIUM  600 + D PO) Take 1 tablet by mouth 2 (two) times daily.     Yes [provider]  donepezil  (ARICEPT ) 5 MG tablet Take 1 tablet by mouth at bedtime. 07/19/23 07/18/24 Yes [provider]  feeding supplement (ENSURE ENLIVE / ENSURE PLUS) LIQD Take 237 mLs by mouth 2 (two) times daily between meals. 10/11/23  Yes Theadore Finger, MD  fish oil-omega-3 fatty acids  1000 MG capsule Take 1 capsule (1 g total) by mouth daily. 10/11/23  Yes Gonfa, Taye T, MD  fluticasone (FLONASE) 50 MCG/ACT nasal spray Place 2 sprays into both nostrils daily as needed for allergies. 07/07/23  Yes [provider]  hydroxychloroquine (PLAQUENIL) 200 MG tablet Take 200 mg by mouth daily.   Yes [provider]  lovastatin (MEVACOR) 20 MG tablet Take 20 mg by mouth at bedtime.     Yes [provider]  omeprazole (PRILOSEC) 40 MG capsule Take 40 mg by mouth in the morning and at bedtime.   Yes [provider]  oxyCODONE  (ROXICODONE ) 5 MG immediate release tablet Take 1 tablet (5 mg total) by mouth every 6 (six) hours as needed for severe pain (pain score 7-10) or breakthrough pain. 10/20/23  Yes Pokhrel, Laxman, MD  sodium chloride  flush 0.9 % SOLN injection Use 10 mL flush as directed as needed.  10/20/23 11/19/23 Yes Carim, Charles A, PA-C  sucralfate (CARAFATE) 1 g tablet Take 1 g by mouth 2 (two) times daily.   Yes [provider]  torsemide (DEMADEX) 20 MG tablet Take 30 mg by mouth daily. 09/04/23  Yes [provider]  venlafaxine  XR (EFFEXOR -XR) 75 MG 24 hr capsule Take 75 mg by mouth daily.   Yes [provider]  acetaminophen  (TYLENOL ) 500 MG tablet Take 2 tablets (1,000 mg total) by mouth every 8 (eight) hours as needed for mild pain (pain score 1-3). 10/20/23   Pokhrel, Laxman, MD  gabapentin  (NEURONTIN ) 100 MG capsule Take 1 capsule (100 mg total) by mouth 3 (three) times daily. 10/20/23 11/19/23  Pokhrel, Laxman, MD      Allergies    Levofloxacin and Tramadol    Review of Systems   Review of Systems  All other systems reviewed and are negative.   Physical Exam Updated Vital Signs BP (!) 104/42   Pulse 83   Temp (!) 97.5 F (36.4 C) (Oral)   Resp 18   SpO2 96%  Physical Exam Vitals and nursing note reviewed.   78 year old female, resting comfortably and in no acute distress. Vital signs are normal. Oxygen saturation is 96%, which is normal. Head is normocephalic and atraumatic. PERRLA, EOMI.  Lungs are clear without rales, wheezes, or rhonchi. Chest is nontender. Heart has regular rate and rhythm without murmur. Abdomen: Colostomy present, moderate tenderness across  the lower abdomen. Neurologic: Awake and alert, moves all extremities equally.  ED Results / Procedures / Treatments   Labs (all labs ordered are listed, but only abnormal results are displayed) Labs Reviewed  BASIC METABOLIC PANEL WITH GFR - Abnormal; Notable for the following components:      Result Value   Potassium 2.7 (*)    Chloride 95 (*)    Glucose, Bld 106 (*)    Creatinine, Ser 1.31 (*)    Calcium  8.6 (*)    GFR, Estimated 41 (*)    All other components within normal limits  CBC - Abnormal; Notable for the following components:   RBC 2.37 (*)    Hemoglobin  6.9 (*)    HCT 22.6 (*)    RDW 18.4 (*)    All other components within normal limits  BASIC METABOLIC PANEL WITH GFR - Abnormal; Notable for the following components:   Calcium  8.8 (*)    GFR, Estimated 58 (*)    All other components within normal limits  BASIC METABOLIC PANEL WITH GFR - Abnormal; Notable for the following components:   CO2 18 (*)    Glucose, Bld 53 (*)    Creatinine, Ser 1.02 (*)    Calcium  8.7 (*)    GFR, Estimated 56 (*)    All other components within normal limits  CBC - Abnormal; Notable for the following components:   RBC 3.48 (*)    Hemoglobin 10.0 (*)    HCT 33.1 (*)    RDW 18.1 (*)    Platelets 411 (*)    All other components within normal limits  GLUCOSE, CAPILLARY - Abnormal; Notable for the following components:   Glucose-Capillary 52 (*)    All other components within normal limits  BASIC METABOLIC PANEL WITH GFR - Abnormal; Notable for the following components:   Calcium  8.6 (*)    All other components within normal limits  CBC - Abnormal; Notable for the following components:   RBC 3.21 (*)    Hemoglobin 9.3 (*)    HCT 29.9 (*)    RDW 17.7 (*)    Platelets 415 (*)    All other components within normal limits  BASIC METABOLIC PANEL WITH GFR - Abnormal; Notable for the following components:   Glucose, Bld 105 (*)    Creatinine, Ser 1.18 (*)    Calcium  8.6 (*)    GFR, Estimated 47 (*)    All other components within normal limits  CBC - Abnormal; Notable for the following components:   RBC 3.66 (*)    Hemoglobin 10.5 (*)    HCT 34.7 (*)    RDW 18.0 (*)    Platelets 462 (*)    All other components within normal limits  AEROBIC/ANAEROBIC CULTURE W GRAM STAIN (SURGICAL/DEEP WOUND)  MAGNESIUM   MAGNESIUM   GLUCOSE, CAPILLARY  GLUCOSE, CAPILLARY  TYPE AND SCREEN  PREPARE RBC (CROSSMATCH)  BLOOD TRANSFUSION REPORT - SCANNED   Narrative:    Ordered by an unspecified provider.   Radiology No results found.  Procedures Procedures     Medications Ordered in ED Medications  potassium chloride  10 mEq in 100 mL IVPB (0 mEq Intravenous Stopped 10/17/23 0538)  potassium chloride  10 mEq in 100 mL IVPB (0 mEq Intravenous Stopped 10/17/23 1450)  0.9 %  sodium chloride  infusion ( Intravenous Stopped 10/18/23 0613)  magnesium  sulfate IVPB 1 g 100 mL (0 g Intravenous Stopped 10/17/23 0702)  dextrose  50 % solution 25 mL (25 mLs Intravenous Given 10/18/23  1610)  midazolam  (VERSED ) injection (1 mg Intravenous Given 10/18/23 0934)  fentaNYL  (SUBLIMAZE ) injection (25 mcg Intravenous Given 10/18/23 0935)  lidocaine  (XYLOCAINE ) 1 % (with pres) injection 10 mL (10 mLs Intradermal Given 10/18/23 1018)    ED Course/ Medical Decision Making/ A&P                                 Medical Decision Making Amount and/or Complexity of Data Reviewed Labs: ordered.  Risk Decision regarding hospitalization.   Intra-abdominal abscess status post surgery for sigmoid diverticulitis.  In addition, labs showed severe hypokalemia she has been treated with oral and intravenous potassium repletion.  She had received antibiotics at [facility] prior to transfer.  I have discussed the case with Dr. Dorrie Gaudier of general surgery service who agrees to see the patient but requests she be admitted under the general medical service.  I have discussed case with Dr. Brice Campi of [facility] who agrees to admit the patient.  Final Clinical Impression(s) / ED Diagnoses Final diagnoses:  Intra-abdominal abscess (HCC)  Acute kidney injury (nontraumatic) (HCC)  Hypokalemia  Elevated random blood glucose level  Thrombocytosis    Rx / DC Orders ED Discharge Orders          Ordered    acetaminophen  (TYLENOL ) 500 MG tablet  Every 8 hours PRN        10/20/23 1141    gabapentin  (NEURONTIN ) 100 MG capsule  3 times daily        10/20/23 1141    ibuprofen  (ADVIL ) 600 MG tablet  Every 8 hours PRN        10/20/23 1141    methocarbamol  (ROBAXIN )  500 MG tablet  3 times daily PRN        10/20/23 1141    oxyCODONE  (ROXICODONE ) 5 MG immediate release tablet  Every 6 hours PRN        10/20/23 1141    Increase activity slowly        10/20/23 1141    Discharge instructions       Comments: Follow-up with your primary care provider in 1 week.  Check blood work at that time.  Follow-up with interventional radiology as scheduled for drain tube management.  Office to schedule an appointment.  Follow-up with general surgery as has been scheduled by the clinic.  Seek medical attention for worsening symptoms.   10/20/23 1141    Diet general        10/20/23 1141    No wound care        10/20/23 1141    Call MD for:  temperature >100.4        10/20/23 1141    Call MD for:  persistant nausea and vomiting        10/20/23 1141    Call MD for:  severe uncontrolled pain        10/20/23 1141    sodium chloride  flush 0.9 % SOLN injection  As needed        10/20/23 1151    IR Radiologist Eval & Mgmt       Comments: Please schedule patient for outpatient drain follow-up in 1 to 2 weeks.   10/20/23 1151              Alissa April, MD 11/08/23 2321

## 2023-11-10 ENCOUNTER — Inpatient Hospital Stay: Attending: Oncology

## 2023-11-10 VITALS — BP 128/53 | HR 85 | Temp 96.8°F | Resp 18

## 2023-11-10 DIAGNOSIS — D509 Iron deficiency anemia, unspecified: Secondary | ICD-10-CM | POA: Diagnosis present

## 2023-11-10 DIAGNOSIS — D500 Iron deficiency anemia secondary to blood loss (chronic): Secondary | ICD-10-CM

## 2023-11-10 MED ORDER — SODIUM CHLORIDE 0.9% FLUSH
10.0000 mL | Freq: Once | INTRAVENOUS | Status: AC | PRN
  Administered 2023-11-10: 10 mL
  Filled 2023-11-10: qty 10

## 2023-11-10 MED ORDER — IRON SUCROSE 20 MG/ML IV SOLN
200.0000 mg | Freq: Once | INTRAVENOUS | Status: AC
  Administered 2023-11-10: 200 mg via INTRAVENOUS

## 2023-11-17 ENCOUNTER — Inpatient Hospital Stay

## 2023-11-17 VITALS — BP 113/52 | HR 87 | Temp 97.0°F | Resp 18

## 2023-11-17 DIAGNOSIS — D500 Iron deficiency anemia secondary to blood loss (chronic): Secondary | ICD-10-CM

## 2023-11-17 DIAGNOSIS — D509 Iron deficiency anemia, unspecified: Secondary | ICD-10-CM | POA: Diagnosis not present

## 2023-11-17 MED ORDER — IRON SUCROSE 20 MG/ML IV SOLN
200.0000 mg | Freq: Once | INTRAVENOUS | Status: AC
  Administered 2023-11-17: 200 mg via INTRAVENOUS
  Filled 2023-11-17: qty 10

## 2023-11-17 NOTE — Patient Instructions (Signed)

## 2023-12-13 ENCOUNTER — Emergency Department

## 2023-12-13 ENCOUNTER — Emergency Department
Admission: EM | Admit: 2023-12-13 | Discharge: 2023-12-13 | Disposition: A | Discharge status: Home / Self Care | Attending: Emergency Medicine | Admitting: Emergency Medicine

## 2023-12-13 ENCOUNTER — Other Ambulatory Visit: Payer: Self-pay

## 2023-12-13 DIAGNOSIS — R079 Chest pain, unspecified: Secondary | ICD-10-CM | POA: Diagnosis present

## 2023-12-13 DIAGNOSIS — K449 Diaphragmatic hernia without obstruction or gangrene: Secondary | ICD-10-CM | POA: Diagnosis not present

## 2023-12-13 LAB — CBC
HCT: 39.1 % (ref 36.0–46.0)
Hemoglobin: 12.9 g/dL (ref 12.0–15.0)
MCH: 30.9 pg (ref 26.0–34.0)
MCHC: 33 g/dL (ref 30.0–36.0)
MCV: 93.8 fL (ref 80.0–100.0)
Platelets: 322 K/uL (ref 150–400)
RBC: 4.17 MIL/uL (ref 3.87–5.11)
RDW: 18.6 % — ABNORMAL HIGH (ref 11.5–15.5)
WBC: 9.8 K/uL (ref 4.0–10.5)
nRBC: 0 % (ref 0.0–0.2)

## 2023-12-13 LAB — BASIC METABOLIC PANEL WITH GFR
Anion gap: 14 (ref 5–15)
BUN: 39 mg/dL — ABNORMAL HIGH (ref 8–23)
CO2: 23 mmol/L (ref 22–32)
Calcium: 10.2 mg/dL (ref 8.9–10.3)
Chloride: 102 mmol/L (ref 98–111)
Creatinine, Ser: 1.42 mg/dL — ABNORMAL HIGH (ref 0.44–1.00)
GFR, Estimated: 37 mL/min — ABNORMAL LOW (ref >60)
Glucose, Bld: 121 mg/dL — ABNORMAL HIGH (ref 70–99)
Potassium: 3.6 mmol/L (ref 3.5–5.1)
Sodium: 139 mmol/L (ref 135–145)

## 2023-12-13 LAB — TROPONIN I (HIGH SENSITIVITY)
Troponin I (High Sensitivity): 7 ng/L (ref <18)
Troponin I (High Sensitivity): 8 ng/L (ref <18)

## 2023-12-13 NOTE — ED Provider Notes (Signed)
 [facility] Provider Note    Event Date/Time   First MD Initiated Contact with Patient 12/13/23 2109     (approximate)   History   Chief Complaint Chest Pain   HPI  Vanessa Cisneros is a 78 y.o. female with past medical history of GERD, iron  deficiency anemia, and intra-abdominal abscess status post colostomy who presents to the ED complaining of chest pain.  Patient reports that shortly after eating breakfast this morning she developed severe sharp pain in the center of her chest radiating towards her left arm.  Pain lasted for a few hours before resolving and she now states that she feels back to normal.  She denies any recent fevers or cough, did not have any shortness of breath with the episode.  She also denies any abdominal pain, nausea, or vomiting.     Physical Exam   Triage Vital Signs: ED Triage Vitals  Encounter Vitals Group     BP 12/13/23 1614 107/67     Girls Systolic BP Percentile --      Girls Diastolic BP Percentile --      Boys Systolic BP Percentile --      Boys Diastolic BP Percentile --      Pulse Rate 12/13/23 1614 (!) 126     Resp 12/13/23 1614 18     Temp 12/13/23 1614 98 F (36.7 C)     Temp Source 12/13/23 1614 Oral     SpO2 12/13/23 1614 98 %     Weight 12/13/23 1616 77 lb (34.9 kg)     Height 12/13/23 1616 4' 11 (1.499 m)     Head Circumference --      Peak Flow --      Pain Score 12/13/23 1614 7     Pain Loc --      Pain Education --      Exclude from Growth Chart --     Most recent vital signs: Vitals:   12/13/23 1910 12/13/23 2227  BP: 114/61 (!) 106/54  Pulse: 94 82  Resp: 17 16  Temp: 98.4 F (36.9 C)   SpO2: 97% 98%    Constitutional: Alert and oriented. Eyes: Conjunctivae are normal. Head: Atraumatic. Nose: No congestion/rhinnorhea. Mouth/Throat: Mucous membranes are moist.  Cardiovascular: Normal rate, regular rhythm. Grossly normal heart sounds.  2+ radial pulses bilaterally. Respiratory:  Normal respiratory effort.  No retractions. Lungs CTAB. Gastrointestinal: Soft and nontender. No distention. Musculoskeletal: No lower extremity tenderness nor edema.  Neurologic:  Normal speech and language. No gross focal neurologic deficits are appreciated.    ED Results / Procedures / Treatments   Labs (all labs ordered are listed, but only abnormal results are displayed) Labs Reviewed  BASIC METABOLIC PANEL WITH GFR - Abnormal; Notable for the following components:      Result Value   Glucose, Bld 121 (*)    BUN 39 (*)    Creatinine, Ser 1.42 (*)    GFR, Estimated 37 (*)    All other components within normal limits  CBC - Abnormal; Notable for the following components:   RDW 18.6 (*)    All other components within normal limits  TROPONIN I (HIGH SENSITIVITY)  TROPONIN I (HIGH SENSITIVITY)     EKG  ED ECG REPORT I, Carlin Palin, the attending physician, personally viewed and interpreted this ECG.   Date: 12/13/2023  EKG Time: 16:20  Rate: 105  Rhythm: sinus tachycardia  Axis: Normal  Intervals:none  ST&T Change: None  RADIOLOGY Chest x-ray reviewed and interpreted by me with no infiltrate, edema, or effusion.  PROCEDURES:  Critical Care performed: No  Procedures   MEDICATIONS ORDERED IN ED: Medications - No data to display   IMPRESSION / MDM / ASSESSMENT AND PLAN / ED COURSE  I reviewed the triage vital signs and the nursing notes.                              78 y.o. female with past medical history of GERD, iron  deficiency anemia, and intra-abdominal abscess status post colostomy who presents to the ED with sharp pain in her chest radiating towards her left arm starting earlier today that has since resolved.  Patient's presentation is most consistent with acute presentation with potential threat to life or bodily function.  Differential diagnosis includes, but is not limited to, ACS, PE, pneumonia, pneumothorax, musculoskeletal pain, GERD,  anxiety.  Patient nontoxic-appearing and in no acute distress, vital signs are unremarkable.  EKG shows no evidence of arrhythmia or ischemia, 2 sets of troponin within normal limits and I doubt ACS.  Given resolution of her symptoms, I doubt PE or dissection.  Chest x-ray with infiltrate versus atelectasis, suspect atelectasis given no symptoms concerning for pneumonia.  She does have a large hiatal hernia, which I suspect contributed to her symptoms.  Labs with mild AKI but no significant anemia, leukocytosis, or electrolyte abnormality.  Given reassuring workup and resolution of her symptoms, patient appropriate for outpatient follow-up with her PCP, was counseled to continue PPI and to return to the ED for new or worsening symptoms.  Patient and family agree with plan.      FINAL CLINICAL IMPRESSION(S) / ED DIAGNOSES   Final diagnoses:  Nonspecific chest pain  Hiatal hernia     Rx / DC Orders   ED Discharge Orders     None        Note:  This document was prepared using Dragon voice recognition software and may include unintentional dictation errors.   Willo Dunnings, MD 12/13/23 2330

## 2023-12-13 NOTE — ED Triage Notes (Signed)
 Pt arrives via POV from home with c/o left sided chest radiating up to their jaw and down their left shoulder that started this morning. Pt denies N/V, SOB. Pt states that the pain is 7/10 and is tearful during triage. Pt is A&Ox4.

## 2024-01-21 ENCOUNTER — Encounter (HOSPITAL_COMMUNITY): Payer: Self-pay | Admitting: Nurse Practitioner

## 2024-02-14 ENCOUNTER — Encounter: Payer: Self-pay | Admitting: Internal Medicine

## 2024-02-28 ENCOUNTER — Other Ambulatory Visit: Payer: Self-pay | Admitting: *Deleted

## 2024-02-28 DIAGNOSIS — D500 Iron deficiency anemia secondary to blood loss (chronic): Secondary | ICD-10-CM

## 2024-02-29 ENCOUNTER — Inpatient Hospital Stay

## 2024-03-01 ENCOUNTER — Encounter
Admission: RE | Disposition: A | Discharge status: Home / Self Care | Payer: Self-pay | Source: Home / Self Care | Attending: Internal Medicine

## 2024-03-01 ENCOUNTER — Ambulatory Visit: Admitting: Anesthesiology

## 2024-03-01 ENCOUNTER — Inpatient Hospital Stay

## 2024-03-01 ENCOUNTER — Other Ambulatory Visit: Payer: Self-pay

## 2024-03-01 ENCOUNTER — Encounter: Payer: Self-pay | Admitting: Internal Medicine

## 2024-03-01 ENCOUNTER — Inpatient Hospital Stay: Admitting: Oncology

## 2024-03-01 ENCOUNTER — Ambulatory Visit
Admission: RE | Admit: 2024-03-01 | Discharge: 2024-03-01 | Disposition: A | Discharge status: Home / Self Care | Attending: Internal Medicine | Admitting: Internal Medicine

## 2024-03-01 DIAGNOSIS — R933 Abnormal findings on diagnostic imaging of other parts of digestive tract: Secondary | ICD-10-CM | POA: Diagnosis present

## 2024-03-01 DIAGNOSIS — D122 Benign neoplasm of ascending colon: Secondary | ICD-10-CM | POA: Diagnosis not present

## 2024-03-01 DIAGNOSIS — I509 Heart failure, unspecified: Secondary | ICD-10-CM | POA: Insufficient documentation

## 2024-03-01 DIAGNOSIS — E782 Mixed hyperlipidemia: Secondary | ICD-10-CM | POA: Insufficient documentation

## 2024-03-01 DIAGNOSIS — F32A Depression, unspecified: Secondary | ICD-10-CM | POA: Insufficient documentation

## 2024-03-01 DIAGNOSIS — D649 Anemia, unspecified: Secondary | ICD-10-CM | POA: Diagnosis not present

## 2024-03-01 DIAGNOSIS — Z9889 Other specified postprocedural states: Secondary | ICD-10-CM | POA: Diagnosis not present

## 2024-03-01 DIAGNOSIS — D120 Benign neoplasm of cecum: Secondary | ICD-10-CM | POA: Diagnosis not present

## 2024-03-01 DIAGNOSIS — Z79899 Other long term (current) drug therapy: Secondary | ICD-10-CM | POA: Diagnosis not present

## 2024-03-01 DIAGNOSIS — K219 Gastro-esophageal reflux disease without esophagitis: Secondary | ICD-10-CM | POA: Insufficient documentation

## 2024-03-01 DIAGNOSIS — Z01818 Encounter for other preprocedural examination: Secondary | ICD-10-CM | POA: Diagnosis not present

## 2024-03-01 DIAGNOSIS — Z933 Colostomy status: Secondary | ICD-10-CM | POA: Diagnosis not present

## 2024-03-01 DIAGNOSIS — E1169 Type 2 diabetes mellitus with other specified complication: Secondary | ICD-10-CM | POA: Diagnosis not present

## 2024-03-01 HISTORY — DX: Colostomy status: Z93.3

## 2024-03-01 HISTORY — DX: Age-related osteoporosis without current pathological fracture: M81.0

## 2024-03-01 HISTORY — DX: Acquired absence of other specified parts of digestive tract: Z90.49

## 2024-03-01 HISTORY — PX: POLYPECTOMY: SHX149

## 2024-03-01 HISTORY — DX: Type 2 diabetes mellitus with other specified complication: E11.69

## 2024-03-01 HISTORY — DX: Other chronic pain: G89.29

## 2024-03-01 HISTORY — DX: Gastritis, unspecified, with bleeding: K29.71

## 2024-03-01 HISTORY — DX: Polyneuropathy in diseases classified elsewhere: G63

## 2024-03-01 HISTORY — DX: Iron deficiency anemia secondary to blood loss (chronic): D50.0

## 2024-03-01 HISTORY — PX: COLONOSCOPY: SHX5424

## 2024-03-01 SURGERY — COLONOSCOPY
Anesthesia: General

## 2024-03-01 MED ORDER — SODIUM CHLORIDE 0.9 % IV SOLN: INTRAVENOUS | Status: DC

## 2024-03-01 MED ORDER — PROPOFOL 10 MG/ML IV BOLUS
INTRAVENOUS | Status: DC | PRN
  Administered 2024-03-01: 25 mg via INTRAVENOUS
  Administered 2024-03-01: 150 ug/kg/min via INTRAVENOUS

## 2024-03-01 MED ORDER — LIDOCAINE HCL (CARDIAC) PF 100 MG/5ML IV SOSY
PREFILLED_SYRINGE | INTRAVENOUS | Status: DC | PRN
  Administered 2024-03-01: 50 mg via INTRAVENOUS

## 2024-03-01 NOTE — H&P (Signed)
 Outpatient short stay form Pre-procedure 03/01/2024 8:14 AM Carmalita Wakefield K. Aundria, M.D.  Primary Physician: Oneil Pinal, M.D.  Reason for visit:    History of present illness:  Ms. Lemaster presents to the [facility] GI clinic for follow-up. Since her last visit with me, she has had several major issues arise. She was advised to have a CT virtual colonoscopy since her colonoscopy was aborted due to restricted mobility and tortuous colon. Prior to her scheduled virtual colonoscopy appointment, she was admitted at [facility] 3/25 - 3/28 for acute onset severe abdominal pain with associated nausea and vomiting. CT scan commented on long-segment of inflammatory change in the rectosigmoid colon c/w focal colitis with mildly dilated proximal colon, gastritis, and large hiatal hernia. She was treated with IV morphine , IV Zofran , IV antibiotics, and IV Zosyn . Stool studies negative for infection. She was advised to reschedule her virtual colonoscopy with 2-day prep. However, she was readmitted 4/30 - 5/5 at [facility] for abdominal pain, decreased appetite, weight loss, nausea, and vomiting. CT scan commented on rectosigmoid wall thickening with a 4.7 x 2.9 x 1.9 cm rim-enhancing lesion concerning for abscess. She was started on IV fluids and IV antibiotics. General Surgery was consulted. She is s/p exploratory laparotomy with partial sigmoid colectomy and end colostomy by Dr. Mitzie Freund with [facility] on 5/2. She progressed well post-operatively. She was readmitted 5/11 - 5/14 for abscess requiring percutaneous drainage. She has continued to follow-up closely with General Surgery team. She had follow-up appt earlier this month with Surgery and was told to follow-back up with GI to discuss repeat colonoscopy prior to consideration of colostomy takedown. She has been told she needs to work on weight gain.     Current Facility-Administered Medications:    0.9 %  sodium chloride  infusion, , Intravenous,  Continuous, Dorrance, Melisa Donofrio K, MD, Last Rate: 20 mL/hr at 03/01/24 0751, New Bag at 03/01/24 0751  Medications Prior to Admission  Medication Sig Dispense Refill Last Dose/Taking   Calcium  Carbonate-Vitamin D (CALCIUM  600 + D PO) Take 1 tablet by mouth 2 (two) times daily.     Past Week   donepezil  (ARICEPT ) 5 MG tablet Take 1 tablet by mouth at bedtime.   02/29/2024   feeding supplement (ENSURE ENLIVE / ENSURE PLUS) LIQD Take 237 mLs by mouth 2 (two) times daily between meals.   02/29/2024   fish oil-omega-3 fatty acids  1000 MG capsule Take 1 capsule (1 g total) by mouth daily.   Past Week   fluticasone (FLONASE) 50 MCG/ACT nasal spray Place 2 sprays into both nostrils daily as needed for allergies.   Past Month   hydroxychloroquine (PLAQUENIL) 200 MG tablet Take 200 mg by mouth daily.   03/01/2024 Morning   lovastatin (MEVACOR) 20 MG tablet Take 20 mg by mouth at bedtime.     03/01/2024 Morning   omeprazole (PRILOSEC) 40 MG capsule Take 40 mg by mouth in the morning and at bedtime.   03/01/2024 Morning   sucralfate (CARAFATE) 1 g tablet Take 1 g by mouth 2 (two) times daily.   03/01/2024 Morning   torsemide (DEMADEX) 20 MG tablet Take 30 mg by mouth daily.   03/01/2024 Morning   venlafaxine  XR (EFFEXOR -XR) 75 MG 24 hr capsule Take 75 mg by mouth daily.   03/01/2024 Morning   acetaminophen  (TYLENOL ) 500 MG tablet Take 2 tablets (1,000 mg total) by mouth every 8 (eight) hours as needed for mild pain (pain score 1-3). 30 tablet 0  gabapentin  (NEURONTIN ) 100 MG capsule Take 1 capsule (100 mg total) by mouth 3 (three) times daily. 90 capsule 0    oxyCODONE  (ROXICODONE ) 5 MG immediate release tablet Take 1 tablet (5 mg total) by mouth every 6 (six) hours as needed for severe pain (pain score 7-10) or breakthrough pain. 15 tablet 0      Allergies  Allergen Reactions   Levofloxacin Other (See Comments)    Patient stated that it makes her extremely sore, and she can hardly move.    Tramadol Nausea Only      Past Medical History:  Diagnosis Date   Anemia    Arthritis    CHF (congestive heart failure) (HCC)    Chronic bilateral low back pain with bilateral sciatica    Colostomy in place [facility])    Depression    DM type 2 with diabetic mixed hyperlipidemia (HCC)    Esophageal reflux    Gastritis with bleeding    GERD (gastroesophageal reflux disease)    Hyperlipidemia, mixed    Immunodeficiency (cell-mediated)    Inflammatory polyarthropathy (HCC)    Iron  deficiency anemia due to chronic blood loss    Lumbar stenosis with neurogenic claudication    Osteoporosis    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    Physical Exam  Gen: Alert, oriented. Appears stated age.  HEENT: [place]/AT. PERRLA. Lungs: CTA, no wheezes. CV: RR nl S1, S2. Abd: soft, benign, no masses. BS+ Ext: No edema. Pulses 2+    Planned procedures: Proceed with colonoscopy. The patient understands the nature of the planned procedure, indications, risks, alternatives and potential complications including but not limited to bleeding, infection, perforation, damage to internal organs and possible oversedation/side effects from anesthesia. The patient agrees and gives consent to proceed.  Please refer to procedure notes for findings, recommendations and patient disposition/instructions.     Shenouda Genova K. Aundria, M.D. Gastroenterology 03/01/2024  8:14 AM

## 2024-03-01 NOTE — Op Note (Signed)
 [facility] Gastroenterology Patient Name: Vanessa Cisneros Procedure Date: 03/01/2024 7:10 AM MRN: [id number] Account #: [id number] Date of Birth: 1945-07-05 Admit Type: Outpatient Age: 78 Room: [facility] ENDO ROOM 2 Gender: Female Note Status: Finalized Instrument Name: Peds Colonoscope [id number] Procedure:             Colonoscopy Indications:           Abnormal CT of the GI tract, Follow-up of                         diverticulitis, Preoperative evaluation for planned                         colostomy takedown Providers:             Dela Sweeny K. Aundria MD, MD Referring MD:          Oneil PHEBE Pinal, MD (Referring MD) Medicines:             Propofol  per Anesthesia Complications:         No immediate complications. Estimated blood loss: None. Procedure:             Pre-Anesthesia Assessment:                        - The risks and benefits of the procedure and the                         sedation options and risks were discussed with the                         patient. All questions were answered and informed                         consent was obtained.                        - Patient identification and proposed procedure were                         verified prior to the procedure by the nurse. The                         procedure was verified in the procedure room.                        - ASA Grade Assessment: III - A patient with severe                         systemic disease.                        - After reviewing the risks and benefits, the patient                         was deemed in satisfactory condition to undergo the                         procedure.  After obtaining informed consent, the colonoscope was                         passed under direct vision. Throughout the procedure,                         the patient's blood pressure, pulse, and oxygen                         saturations were monitored continuously. The                          Colonoscope was introduced through the descending                         colostomy and advanced to the the cecum, identified by                         appendiceal orifice and ileocecal valve. The                         colonoscopy was performed without difficulty. The                         patient tolerated the procedure well. The quality of                         the bowel preparation was adequate. The ileocecal                         valve, appendiceal orifice, and rectum were                         photographed. Findings:      The perianal and digital rectal examinations were normal. Pertinent       negatives include normal sphincter tone.      There was evidence of a prior hartmann's pouch anastomosis in the       rectum. This was non-patent and was characterized by healthy appearing       mucosa.      Scope was withdrawn and the patient was placed in the supine position.       The scope entered the colostomy on the left upper quadrant and was       advanced to the cecum.      A 14 mm polyp was found in the cecum. The polyp was sessile. Area was       successfully injected with 2 mL Eleview for lesion assessment, and this       injection appeared to lift the lesion adequately. The polyp was removed       with a hot snare. Resection and retrieval were complete. Estimated blood       loss: none.      A 10 mm polyp was found in the proximal ascending colon. The polyp was       sessile. The polyp was removed with a hot snare. Resection and retrieval       were complete. To prevent bleeding after the polypectomy, one hemostatic       clip was successfully placed (MR conditional). Clip manufacturer: [employer]. There was no bleeding during, or at the end, of the       procedure. Estimated blood loss: none.      The exam was otherwise without abnormality. Impression:            - Non-patent hartmann's pouch anastomosis,                         characterized by  healthy appearing mucosa.                        - One 14 mm polyp in the cecum, removed with a hot                         snare. Resected and retrieved. Injected.                        - One 10 mm polyp in the proximal ascending colon,                         removed with a hot snare. Resected and retrieved. Clip                         (MR conditional) was placed. Clip manufacturer: [employer].                        - The examination was otherwise normal. Recommendation:        - Patient has a contact number available for                         emergencies. The signs and symptoms of potential                         delayed complications were discussed with the patient.                         Return to normal activities tomorrow. Written                         discharge instructions were provided to the patient.                        - Resume previous diet.                        - Continue present medications.                        - Await pathology results.                        - If polyps are benign or adenomatous without                         dysplasia, I will advise NO further colonoscopy due to  advanced age and/or severe comorbidity.                        - Proceed with colostomy takedown per Dr. Signe from                         [facility]                        - Return to GI office PRN.                        - The findings and recommendations were discussed with                         the patient. Procedure Code(s):     --- Professional ---                        [phone], Colonoscopy through stoma; with removal of                         tumor(s), polyp(s), or other lesion(s) by snare                         technique                        44404, Colonoscopy through stoma; with directed                         submucosal injection(s), any substance Diagnosis Code(s):     --- Professional  ---                        R93.3, Abnormal findings on diagnostic imaging of                         other parts of digestive tract                        K57.32, Diverticulitis of large intestine without                         perforation or abscess without bleeding                        D12.2, Benign neoplasm of ascending colon                        D12.0, Benign neoplasm of cecum                        K91.89, Other postprocedural complications and                         disorders of digestive system CPT copyright 2022 American Medical Association. All rights reserved. The codes documented in this report are preliminary and upon coder review may  be revised to meet current compliance requirements. Ladell MARLA Boss MD, MD 03/01/2024 10:17:44 AM This report has been signed electronically. Number of Addenda: 0 Note Initiated On: 03/01/2024 7:10 AM Scope Withdrawal Time: 0  hours 16 minutes 56 seconds  Total Procedure Duration: 0 hours 25 minutes 32 seconds  Estimated Blood Loss:  Estimated blood loss: none.      [facility]

## 2024-03-01 NOTE — Transfer of Care (Signed)
 Immediate Anesthesia Transfer of Care Note  Patient: Vanessa Cisneros  Procedure(s) Performed: COLONOSCOPY POLYPECTOMY, INTESTINE  Patient Location: PACU  Anesthesia Type:General  Level of Consciousness: awake, alert , and oriented  Airway & Oxygen Therapy: Patient Spontanous Breathing  Post-op Assessment: Report given to RN and Post -op Vital signs reviewed and stable  Post vital signs: stable  Last Vitals:  Vitals Value Taken Time  BP 107/59 03/01/24 10:11  Temp    Pulse    Resp 21 03/01/24 10:11  SpO2 99 % 03/01/24 10:11    Last Pain:  Vitals:   03/01/24 1011  TempSrc:   PainSc: 0-No pain         Complications: There were no known notable events for this encounter.

## 2024-03-01 NOTE — Interval H&P Note (Signed)
 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  The various methods of treatment have been discussed with the patient and family. After consideration of risks, benefits and other options for treatment, the patient has consented to  Procedure(s): COLONOSCOPY (N/A) as a surgical intervention.  The patient's history has been reviewed, patient examined, no change in status, stable for surgery.  I have reviewed the patient's chart and labs.  Questions were answered to the patient's satisfaction.     [place], Felicite Zeimet

## 2024-03-01 NOTE — Anesthesia Preprocedure Evaluation (Signed)
 Anesthesia Evaluation  Patient identified by MRN, date of birth, ID band Patient awake    Reviewed: Allergy & Precautions, H&P , NPO status , Patient's Chart, lab work & pertinent test results, reviewed documented beta blocker date and time   Airway Mallampati: II   Neck ROM: full    Dental  (+) Poor Dentition   Pulmonary neg pulmonary ROS   Pulmonary exam normal        Cardiovascular Exercise Tolerance: Poor +CHF  negative cardio ROS Normal cardiovascular exam Rhythm:regular Rate:Normal     Neuro/Psych  PSYCHIATRIC DISORDERS  Depression     Neuromuscular disease negative neurological ROS  negative psych ROS   GI/Hepatic negative GI ROS, Neg liver ROS,GERD  Medicated,,  Endo/Other  negative endocrine ROSdiabetes    Renal/GU Renal diseasenegative Renal ROS  negative genitourinary   Musculoskeletal   Abdominal   Peds  Hematology negative hematology ROS (+) Blood dyscrasia, anemia   Anesthesia Other Findings Past Medical History: No date: Anemia No date: Arthritis No date: CHF (congestive heart failure) (HCC) No date: Chronic bilateral low back pain with bilateral sciatica No date: Colostomy in place [facility]) No date: Depression No date: DM type 2 with diabetic mixed hyperlipidemia (HCC) No date: Esophageal reflux No date: Gastritis with bleeding No date: GERD (gastroesophageal reflux disease) No date: Hyperlipidemia, mixed No date: Immunodeficiency (cell-mediated) No date: Inflammatory polyarthropathy (HCC) No date: Iron  deficiency anemia due to chronic blood loss No date: Lumbar stenosis with neurogenic claudication No date: Osteoporosis No date: 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 No date: Status post partial colectomy Past Surgical History: No date: ABDOMINAL HYSTERECTOMY 06/08/2012: BREAST BIOPSY; Left     Comment:  neg No date: CHOLECYSTECTOMY 10/08/2023: COLON RESECTION SIGMOID; N/A     Comment:   Procedure: COLECTOMY, SIGMOID, OPEN;  Surgeon: Signe Mitzie LABOR, MD;  Location: [facility] OR;  Service: General;                Laterality: N/A; 07/21/2023: COLONOSCOPY WITH PROPOFOL ; N/A     Comment:  Procedure: COLONOSCOPY WITH PROPOFOL ;  Surgeon: Toledo,               Ladell POUR, MD;  Location: [facility] ENDOSCOPY;  Service:               Gastroenterology;  Laterality: N/A; 10/08/2023: COLOSTOMY; N/A     Comment:  Procedure: CREATION, COLOSTOMY;  Surgeon: Signe Mitzie LABOR, MD;  Location: [facility] OR;  Service: General;                Laterality: N/A; 10/14/2015: ESOPHAGOGASTRODUODENOSCOPY (EGD) WITH PROPOFOL ; N/A     Comment:  Procedure: ESOPHAGOGASTRODUODENOSCOPY (EGD) WITH               PROPOFOL ;  Surgeon: Lamar ONEIDA Holmes, MD;  Location: [facility]              ENDOSCOPY;  Service: Endoscopy;  Laterality: N/A; 07/21/2023: ESOPHAGOGASTRODUODENOSCOPY (EGD) WITH PROPOFOL ; N/A     Comment:  Procedure: ESOPHAGOGASTRODUODENOSCOPY (EGD) WITH               PROPOFOL ;  Surgeon: Toledo, Ladell POUR, MD;  Location:               [facility] ENDOSCOPY;  Service: Gastroenterology;  Laterality:  N/A; No date: Hartman's Procedure; N/A 10/28/2023: IR RADIOLOGIST EVAL & MGMT 10/08/2023: LAPAROTOMY; N/A     Comment:  Procedure: LAPAROTOMY, EXPLORATORY;  Surgeon: Signe Mitzie LABOR, MD;  Location: [facility] OR;  Service: General;                Laterality: N/A;  [place] Intra-abdominal Abscess No date: TUMOR REMOVAL; N/A     Comment:  stomach   Reproductive/Obstetrics negative OB ROS                              Anesthesia Physical Anesthesia Plan  ASA: 3  Anesthesia Plan: General   Post-op Pain Management:    Induction:   PONV Risk Score and Plan:   Airway Management Planned:   Additional Equipment:   Intra-op Plan:   Post-operative Plan:   Informed Consent: I have reviewed the patients History and Physical, chart, labs and discussed  the procedure including the risks, benefits and alternatives for the proposed anesthesia with the patient or authorized representative who has indicated his/her understanding and acceptance.     Dental Advisory Given  Plan Discussed with: CRNA  Anesthesia Plan Comments:         Anesthesia Quick Evaluation

## 2024-03-01 NOTE — Interval H&P Note (Signed)
 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  The various methods of treatment have been discussed with the patient and family. After consideration of risks, benefits and other options for treatment, the patient has consented to  Procedure(s): COLONOSCOPY (N/A) as a surgical intervention.  The patient's history has been reviewed, patient examined, no change in status, stable for surgery.  I have reviewed the patient's chart and labs.  Questions were answered to the patient's satisfaction.     [place], Chani Ghanem

## 2024-03-02 LAB — SURGICAL PATHOLOGY

## 2024-03-06 NOTE — Anesthesia Postprocedure Evaluation (Signed)
 Anesthesia Post Note  Patient: Vanessa Cisneros  Procedure(s) Performed: COLONOSCOPY POLYPECTOMY, INTESTINE  Patient location during evaluation: PACU Anesthesia Type: General Level of consciousness: awake and alert Pain management: pain level controlled Vital Signs Assessment: post-procedure vital signs reviewed and stable Respiratory status: spontaneous breathing, nonlabored ventilation, respiratory function stable and patient connected to nasal cannula oxygen Cardiovascular status: blood pressure returned to baseline and stable Postop Assessment: no apparent nausea or vomiting Anesthetic complications: no   There were no known notable events for this encounter.   Last Vitals:  Vitals:   03/01/24 1021 03/01/24 1031  BP: 110/62 121/71  Pulse: 85 78  Resp: (!) 22 (!) 22  Temp:    SpO2: 100% 100%    Last Pain:  Vitals:   03/01/24 1031  TempSrc:   PainSc: 0-No pain                 [place] KANDICE Clause

## 2024-03-15 ENCOUNTER — Inpatient Hospital Stay: Attending: Oncology

## 2024-03-15 DIAGNOSIS — D509 Iron deficiency anemia, unspecified: Secondary | ICD-10-CM | POA: Insufficient documentation

## 2024-03-15 DIAGNOSIS — M810 Age-related osteoporosis without current pathological fracture: Secondary | ICD-10-CM | POA: Insufficient documentation

## 2024-03-17 ENCOUNTER — Inpatient Hospital Stay

## 2024-03-17 ENCOUNTER — Inpatient Hospital Stay: Admitting: Oncology

## 2024-03-27 ENCOUNTER — Inpatient Hospital Stay

## 2024-03-27 ENCOUNTER — Other Ambulatory Visit

## 2024-03-27 DIAGNOSIS — M810 Age-related osteoporosis without current pathological fracture: Secondary | ICD-10-CM | POA: Diagnosis not present

## 2024-03-27 DIAGNOSIS — D509 Iron deficiency anemia, unspecified: Secondary | ICD-10-CM | POA: Diagnosis present

## 2024-03-27 DIAGNOSIS — D500 Iron deficiency anemia secondary to blood loss (chronic): Secondary | ICD-10-CM

## 2024-03-27 LAB — CBC WITH DIFFERENTIAL/PLATELET
Abs Immature Granulocytes: 0.03 K/uL (ref 0.00–0.07)
Basophils Absolute: 0.1 K/uL (ref 0.0–0.1)
Basophils Relative: 1 %
Eosinophils Absolute: 0.1 K/uL (ref 0.0–0.5)
Eosinophils Relative: 2 %
HCT: 40.6 % (ref 36.0–46.0)
Hemoglobin: 13.3 g/dL (ref 12.0–15.0)
Immature Granulocytes: 1 %
Lymphocytes Relative: 25 %
Lymphs Abs: 1.4 K/uL (ref 0.7–4.0)
MCH: 35.6 pg — ABNORMAL HIGH (ref 26.0–34.0)
MCHC: 32.8 g/dL (ref 30.0–36.0)
MCV: 108.6 fL — ABNORMAL HIGH (ref 80.0–100.0)
Monocytes Absolute: 0.7 K/uL (ref 0.1–1.0)
Monocytes Relative: 12 %
Neutro Abs: 3.5 K/uL (ref 1.7–7.7)
Neutrophils Relative %: 59 %
Platelets: 238 K/uL (ref 150–400)
RBC: 3.74 MIL/uL — ABNORMAL LOW (ref 3.87–5.11)
RDW: 12.6 % (ref 11.5–15.5)
WBC: 5.8 K/uL (ref 4.0–10.5)
nRBC: 0 % (ref 0.0–0.2)

## 2024-03-27 LAB — FERRITIN: Ferritin: 2057 ng/mL — ABNORMAL HIGH (ref 11–307)

## 2024-03-27 LAB — IRON AND TIBC
Iron: 75 ug/dL (ref 28–170)
Saturation Ratios: 30 % (ref 10.4–31.8)
TIBC: 252 ug/dL (ref 250–450)
UIBC: 177 ug/dL

## 2024-03-28 ENCOUNTER — Inpatient Hospital Stay (HOSPITAL_BASED_OUTPATIENT_CLINIC_OR_DEPARTMENT_OTHER): Admitting: Oncology

## 2024-03-28 ENCOUNTER — Encounter: Payer: Self-pay | Admitting: Oncology

## 2024-03-28 ENCOUNTER — Ambulatory Visit

## 2024-03-28 ENCOUNTER — Inpatient Hospital Stay

## 2024-03-28 ENCOUNTER — Ambulatory Visit: Admitting: Oncology

## 2024-03-28 VITALS — BP 120/68 | HR 110 | Temp 99.1°F | Resp 18 | Ht 59.0 in | Wt 74.0 lb

## 2024-03-28 DIAGNOSIS — M810 Age-related osteoporosis without current pathological fracture: Secondary | ICD-10-CM | POA: Diagnosis not present

## 2024-03-28 DIAGNOSIS — D500 Iron deficiency anemia secondary to blood loss (chronic): Secondary | ICD-10-CM | POA: Diagnosis not present

## 2024-03-28 NOTE — Progress Notes (Signed)
 [facility]  [phone] [phone]  ID: Edna Georgina Fischer OB: May 28, 1946  MR#: [id number]  RDW#:[id number]  Patient Care Team: Cleotilde Oneil FALCON, MD as PCP - General (Internal Medicine) Jacobo Evalene PARAS, MD as Consulting Physician (Oncology)  CHIEF COMPLAINT: Iron  deficiency anemia.  INTERVAL HISTORY: Patient returns to clinic today for repeat laboratory, further evaluation, and consideration of additional IV Venofer .  She currently feels well and is asymptomatic.  She does not complain of any weakness or fatigue.  She has no further abdominal pain. She has no neurologic complaints.  She denies any recent fevers.  She has a good appetite and denies weight loss.  She has no chest pain, shortness of breath, cough, or hemoptysis.  She has no nausea, vomiting, constipation, or diarrhea.  She has no melena or hematochezia.  She has no urinary complaints.  Patient offers no specific complaints today.  REVIEW OF SYSTEMS:   Review of Systems  Constitutional: Negative.  Negative for fever, malaise/fatigue and weight loss.  Respiratory: Negative.  Negative for cough, hemoptysis and shortness of breath.   Cardiovascular: Negative.  Negative for chest pain and leg swelling.  Gastrointestinal: Negative.  Negative for abdominal pain, blood in stool and melena.  Genitourinary: Negative.  Negative for dysuria.  Musculoskeletal: Negative.  Negative for back pain.  Skin: Negative.  Negative for rash.  Neurological: Negative.  Negative for dizziness, focal weakness, weakness and headaches.  Psychiatric/Behavioral: Negative.  The patient is not nervous/anxious.     As per HPI. Otherwise, a complete review of systems is negative.  PAST MEDICAL HISTORY: Past Medical History:  Diagnosis Date   Anemia    Arthritis    CHF (congestive heart failure) (HCC)    Chronic bilateral low back pain with bilateral sciatica    Colostomy in place [facility])    Depression    DM  type 2 with diabetic mixed hyperlipidemia (HCC)    Esophageal reflux    Gastritis with bleeding    GERD (gastroesophageal reflux disease)    Hyperlipidemia, mixed    Immunodeficiency (cell-mediated)    Inflammatory polyarthropathy (HCC)    Iron  deficiency anemia due to chronic blood loss    Lumbar stenosis with neurogenic claudication    Osteoporosis    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    Status post partial colectomy     PAST SURGICAL HISTORY: Past Surgical History:  Procedure Laterality Date   ABDOMINAL HYSTERECTOMY     BREAST BIOPSY Left 06/08/2012   neg   CHOLECYSTECTOMY     COLON RESECTION SIGMOID N/A 10/08/2023   Procedure: COLECTOMY, SIGMOID, OPEN;  Surgeon: Signe Mitzie LABOR, MD;  Location: [facility] OR;  Service: General;  Laterality: N/A;   COLONOSCOPY N/A 03/01/2024   Procedure: COLONOSCOPY;  Surgeon: Toledo, Ladell POUR, MD;  Location: [facility] ENDOSCOPY;  Service: Gastroenterology;  Laterality: N/A;   COLONOSCOPY WITH PROPOFOL  N/A 07/21/2023   Procedure: COLONOSCOPY WITH PROPOFOL ;  Surgeon: Toledo, Ladell POUR, MD;  Location: [facility] ENDOSCOPY;  Service: Gastroenterology;  Laterality: N/A;   COLOSTOMY N/A 10/08/2023   Procedure: CREATION, COLOSTOMY;  Surgeon: Signe Mitzie LABOR, MD;  Location: [facility] OR;  Service: General;  Laterality: N/A;   ESOPHAGOGASTRODUODENOSCOPY (EGD) WITH PROPOFOL  N/A 10/14/2015   Procedure: ESOPHAGOGASTRODUODENOSCOPY (EGD) WITH PROPOFOL ;  Surgeon: Lamar ONEIDA Holmes, MD;  Location: [facility] ENDOSCOPY;  Service: Endoscopy;  Laterality: N/A;   ESOPHAGOGASTRODUODENOSCOPY (EGD) WITH PROPOFOL  N/A 07/21/2023   Procedure: ESOPHAGOGASTRODUODENOSCOPY (EGD) WITH PROPOFOL ;  Surgeon: Toledo, Ladell POUR, MD;  Location: [facility] ENDOSCOPY;  Service: Gastroenterology;  Laterality: N/A;   Hartman's Procedure N/A    IR RADIOLOGIST EVAL & MGMT  10/28/2023   LAPAROTOMY N/A 10/08/2023   Procedure: LAPAROTOMY, EXPLORATORY;  Surgeon: Signe Mitzie LABOR, MD;  Location: [facility] OR;  Service:  General;  Laterality: N/A;  [place] Intra-abdominal Abscess   POLYPECTOMY  03/01/2024   Procedure: POLYPECTOMY, INTESTINE;  Surgeon: Toledo, Ladell POUR, MD;  Location: [facility] ENDOSCOPY;  Service: Gastroenterology;;   TUMOR REMOVAL N/A    stomach    FAMILY HISTORY: History reviewed. No pertinent family history.  ADVANCED DIRECTIVES (Y/N):  N  HEALTH MAINTENANCE: Social History   Tobacco Use   Smoking status: Never    Passive exposure: Never   Smokeless tobacco: Never  Vaping Use   Vaping status: Never Used  Substance Use Topics   Alcohol use: No   Drug use: Never     Colonoscopy:  PAP:  Bone density:  Lipid panel:  Allergies  Allergen Reactions   Levofloxacin Other (See Comments)    Patient stated that it makes her extremely sore, and she can hardly move.    Tramadol Nausea Only    Current Outpatient Medications  Medication Sig Dispense Refill   acetaminophen  (TYLENOL ) 500 MG tablet Take 2 tablets (1,000 mg total) by mouth every 8 (eight) hours as needed for mild pain (pain score 1-3). 30 tablet 0   Calcium  Carbonate-Vitamin D (CALCIUM  600 + D PO) Take 1 tablet by mouth 2 (two) times daily.       donepezil  (ARICEPT ) 5 MG tablet Take 1 tablet by mouth at bedtime.     feeding supplement (ENSURE ENLIVE / ENSURE PLUS) LIQD Take 237 mLs by mouth 2 (two) times daily between meals.     fish oil-omega-3 fatty acids  1000 MG capsule Take 1 capsule (1 g total) by mouth daily.     fluticasone (FLONASE) 50 MCG/ACT nasal spray Place 2 sprays into both nostrils daily as needed for allergies.     gabapentin  (NEURONTIN ) 100 MG capsule Take 1 capsule (100 mg total) by mouth 3 (three) times daily. 90 capsule 0   hydroxychloroquine (PLAQUENIL) 200 MG tablet Take 200 mg by mouth daily.     lovastatin (MEVACOR) 20 MG tablet Take 20 mg by mouth at bedtime.       omeprazole (PRILOSEC) 40 MG capsule Take 40 mg by mouth in the morning and at bedtime.     oxyCODONE  (ROXICODONE ) 5 MG immediate  release tablet Take 1 tablet (5 mg total) by mouth every 6 (six) hours as needed for severe pain (pain score 7-10) or breakthrough pain. 15 tablet 0   sucralfate (CARAFATE) 1 g tablet Take 1 g by mouth 2 (two) times daily.     torsemide (DEMADEX) 20 MG tablet Take 30 mg by mouth daily.     venlafaxine  XR (EFFEXOR -XR) 75 MG 24 hr capsule Take 75 mg by mouth daily.     No current facility-administered medications for this visit.    OBJECTIVE: Vitals:   03/28/24 1038  BP: 120/68  Pulse: (!) 110  Resp: 18  Temp: 99.1 F (37.3 C)  SpO2: 98%     Body mass index is 14.95 kg/m.    ECOG FS:0 - Asymptomatic  General: Well-developed, well-nourished, no acute distress. Eyes: Pink conjunctiva, anicteric sclera. HEENT: Normocephalic, moist mucous membranes. Lungs: No audible wheezing or coughing. Heart: Regular rate and rhythm. Abdomen: Soft, nontender, no obvious distention. Musculoskeletal: No edema, cyanosis, or clubbing. Neuro: Alert, answering all questions appropriately.  Cranial nerves grossly intact. Skin: No rashes or petechiae noted. Psych: Normal affect.  LAB RESULTS:  Lab Results  Component Value Date   NA 139 12/13/2023   K 3.6 12/13/2023   CL 102 12/13/2023   CO2 23 12/13/2023   GLUCOSE 121 (H) 12/13/2023   BUN 39 (H) 12/13/2023   CREATININE 1.42 (H) 12/13/2023   CALCIUM  10.2 12/13/2023   PROT 7.1 10/16/2023   ALBUMIN  3.1 (L) 10/16/2023   AST 27 10/16/2023   ALT 15 10/16/2023   ALKPHOS 47 10/16/2023   BILITOT 0.5 10/16/2023   GFRNONAA 37 (L) 12/13/2023   GFRAA 59 (L) 09/30/2015    Lab Results  Component Value Date   WBC 5.8 03/27/2024   NEUTROABS 3.5 03/27/2024   HGB 13.3 03/27/2024   HCT 40.6 03/27/2024   MCV 108.6 (H) 03/27/2024   PLT 238 03/27/2024   Lab Results  Component Value Date   IRON  75 03/27/2024   TIBC 252 03/27/2024   IRONPCTSAT 30 03/27/2024   Lab Results  Component Value Date   FERRITIN 2,057 (H) 03/27/2024     STUDIES: No  results found.   ASSESSMENT: Iron  deficiency anemia.  PLAN:    Iron  deficiency anemia: Resolved.  Patient's hemoglobin and iron  stores are now within normal limits.  Repeat colonoscopy on March 01, 2024 revealed 2 benign polyps, but no other significant pathology.  Patient does not require additional IV Venofer  today.  She last received treatment on November 17, 2023.  No further interventions needed.  Return to clinic in 4 months with repeat laboratory, further evaluation, and continuation of treatment.  If patient's laboratory work remains within normal limits at that time, she could possibly be discharged from clinic.   Intra-abdominal abscess: Resolved.  Recent colonoscopy results as above.  Patient reports she may undergo colostomy reversal in December.  I spent a total of 20 minutes reviewing chart data, face-to-face evaluation with the patient, counseling and coordination of care as detailed above.    Patient expressed understanding and was in agreement with this plan. She also understands that She can call clinic at any time with any questions, concerns, or complaints.    Evalene JINNY Reusing, MD   03/28/2024 3:56 PM

## 2024-05-24 ENCOUNTER — Ambulatory Visit: Payer: Self-pay | Admitting: Surgery

## 2024-06-08 ENCOUNTER — Encounter: Payer: Self-pay | Admitting: Oncology

## 2024-06-12 NOTE — Progress Notes (Signed)
 Anesthesia Review:  PCP: Oneil Pinal LOV 02/10/24  Cardiologist :  PPM/ ICD: Device Orders: Rep Notified:  Chest x-ray : 12/13/23- 2 view  EKG : 12/13/23  Echo : Stress test: Cardiac Cath :   Activity level:  Sleep Study/ CPAP : Fasting Blood Sugar :      / Checks Blood Sugar -- times a day:     DM- type  Hgba1c-   Blood Thinner/ Instructions /Last Dose: ASA / Instructions/ Last Dose :

## 2024-06-13 ENCOUNTER — Encounter: Payer: Self-pay | Admitting: Oncology

## 2024-06-13 NOTE — Patient Instructions (Signed)
 SURGICAL WAITING ROOM VISITATION  Patients having surgery or a procedure may have no more than 2 support people in the waiting area - these visitors may rotate.    Children ages 23 and under will not be able to visit patients in [facility] under most circumstances.   Visitors with respiratory illnesses are discouraged from visiting and should remain at home.  If the patient needs to stay at the hospital during part of their recovery, the visitor guidelines for inpatient rooms apply. Pre-op nurse will coordinate an appropriate time for 1 support person to accompany patient in pre-op.  This support person may not rotate.    Please refer to the [facility] website for the visitor guidelines for Inpatients (after your surgery is over and you are in a regular room).       Your procedure is scheduled on:  06/23/2024    Report to [facility] Main Entrance    Report to admitting at  0515 AM   Call this number if you have problems the morning of surgery [phone]   Do not eat food :After Midnight.   After Midnight you may have the following liquids until _ 0430_____ AM  DAY OF SURGERY  Water Non-Citrus Juices (without pulp, NO RED-Apple, White grape, White cranberry) Black Coffee (NO MILK/CREAM OR CREAMERS, sugar ok)  Clear Tea (NO MILK/CREAM OR CREAMERS, sugar ok) regular and decaf                             Plain Jell-O (NO RED)                                           Fruit ices (not with fruit pulp, NO RED)                                     Popsicles (NO RED)                                                               Sports drinks like Gatorade (NO RED)                     The day of surgery:  Drink ONE (1) Pre-Surgery Clear Ensure or G2 at  0430AM the morning of surgery. Drink in one sitting. Do not sip.  This drink was given to you during your hospital  pre-op appointment visit. Nothing else to drink after completing the  Pre-Surgery Clear Ensure  or G2.          If you have questions, please contact your surgeons office.       Oral Hygiene is also important to reduce your risk of infection.                                    Remember - BRUSH YOUR TEETH THE MORNING OF SURGERY WITH YOUR REGULAR TOOTHPASTE  DENTURES WILL BE REMOVED PRIOR TO SURGERY PLEASE DO NOT  APPLY Poly grip OR ADHESIVES!!!   Do NOT smoke after Midnight   Stop all vitamins and herbal supplements 7 days before surgery.   Take these medicines the morning of surgery with A SIP OF WATER:  flonase if needed, gabapentin , omeprazole, effexor    DO NOT TAKE ANY ORAL DIABETIC MEDICATIONS DAY OF YOUR SURGERY  Bring CPAP mask and tubing day of surgery.                              You may not have any metal on your body including hair pins, jewelry, and body piercing             Do not wear make-up, lotions, powders, perfumes/cologne, or deodorant  Do not wear nail polish including gel and S&S, artificial/acrylic nails, or any other type of covering on natural nails including finger and toenails. If you have artificial nails, gel coating, etc. that needs to be removed by a nail salon please have this removed prior to surgery or surgery may need to be canceled/ delayed if the surgeon/ anesthesia feels like they are unable to be safely monitored.   Do not shave  48 hours prior to surgery.               Men may shave face and neck.   Do not bring valuables to the hospital. [facility] IS NOT             RESPONSIBLE   FOR VALUABLES.   Contacts, glasses, dentures or bridgework may not be worn into surgery.   Bring small overnight bag day of surgery.   DO NOT BRING YOUR HOME MEDICATIONS TO THE HOSPITAL. PHARMACY WILL DISPENSE MEDICATIONS LISTED ON YOUR MEDICATION LIST TO YOU DURING YOUR ADMISSION IN THE HOSPITAL!    Patients discharged on the day of surgery will not be allowed to drive home.  Someone NEEDS to stay with you for the first 24 hours after  anesthesia.   Special Instructions: Bring a copy of your healthcare power of attorney and living will documents the day of surgery if you haven't scanned them before.              Please read over the following fact sheets you were given: IF YOU HAVE QUESTIONS ABOUT YOUR PRE-OP INSTRUCTIONS PLEASE CALL [phone].   If you received a COVID test during your pre-op visit  it is requested that you wear a mask when out in public, stay away from anyone that may not be feeling well and notify your surgeon if you develop symptoms. If you test positive for Covid or have been in contact with anyone that has tested positive in the last 10 days please notify you surgeon.    [facility] - Preparing for Surgery Before surgery, you can play an important role.  Because skin is not sterile, your skin needs to be as free of germs as possible.  You can reduce the number of germs on your skin by washing with CHG (chlorahexidine gluconate) soap before surgery.  CHG is an antiseptic cleaner which kills germs and bonds with the skin to continue killing germs even after washing. Please DO NOT use if you have an allergy to CHG or antibacterial soaps.  If your skin becomes reddened/irritated stop using the CHG and inform your nurse when you arrive at Short Stay. Do not shave (including legs and underarms) for at least 48 hours prior to the  first CHG shower.  You may shave your face/neck.  Please follow these instructions carefully:  1.  Shower with CHG Soap the night before surgery ONLY (DO NOT USE THE SOAP THE MORNING OF SURGERY).  2.  If you choose to wash your hair, wash your hair first as usual with your normal  shampoo.  3.  After you shampoo, rinse your hair and body thoroughly to remove the shampoo.                             4.  Use CHG as you would any other liquid soap.  You can apply chg directly to the skin and wash.  Gently with a scrungie or clean washcloth.  5.  Apply the CHG Soap to your body ONLY FROM THE  NECK DOWN.   Do   not use on face/ open                           Wound or open sores. Avoid contact with eyes, ears mouth and   genitals (private parts).                       Wash face,  Genitals (private parts) with your normal soap.             6.  Wash thoroughly, paying special attention to the area where your    surgery  will be performed.  7.  Thoroughly rinse your body with warm water from the neck down.  8.  DO NOT shower/wash with your normal soap after using and rinsing off the CHG Soap.                9.  Pat yourself dry with a clean towel.            10.  Wear clean pajamas.            11.  Place clean sheets on your bed the night of your first shower and do not  sleep with pets. Day of Surgery : Do not apply any CHG, lotions/deodorants the morning of surgery.  Please wear clean clothes to the hospital/surgery center.  FAILURE TO FOLLOW THESE INSTRUCTIONS MAY RESULT IN THE CANCELLATION OF YOUR SURGERY  PATIENT SIGNATURE_________________________________  NURSE SIGNATURE__________________________________  ________________________________________________________________________

## 2024-06-14 ENCOUNTER — Other Ambulatory Visit: Payer: Self-pay

## 2024-06-14 ENCOUNTER — Encounter (HOSPITAL_COMMUNITY)
Admission: RE | Admit: 2024-06-14 | Discharge: 2024-06-14 | Disposition: A | Discharge status: Home / Self Care | Source: Ambulatory Visit | Attending: Surgery | Admitting: Surgery

## 2024-06-14 ENCOUNTER — Encounter (HOSPITAL_COMMUNITY): Payer: Self-pay

## 2024-06-14 VITALS — BP 113/68 | HR 78 | Temp 98.4°F | Resp 16 | Ht 59.0 in | Wt 78.0 lb

## 2024-06-14 DIAGNOSIS — K219 Gastro-esophageal reflux disease without esophagitis: Secondary | ICD-10-CM | POA: Insufficient documentation

## 2024-06-14 DIAGNOSIS — I081 Rheumatic disorders of both mitral and tricuspid valves: Secondary | ICD-10-CM | POA: Diagnosis not present

## 2024-06-14 DIAGNOSIS — Z01818 Encounter for other preprocedural examination: Secondary | ICD-10-CM | POA: Diagnosis present

## 2024-06-14 DIAGNOSIS — Z01812 Encounter for preprocedural laboratory examination: Secondary | ICD-10-CM | POA: Insufficient documentation

## 2024-06-14 DIAGNOSIS — Z933 Colostomy status: Secondary | ICD-10-CM | POA: Diagnosis not present

## 2024-06-14 DIAGNOSIS — M1990 Unspecified osteoarthritis, unspecified site: Secondary | ICD-10-CM | POA: Diagnosis not present

## 2024-06-14 LAB — BASIC METABOLIC PANEL WITH GFR
Anion gap: 12 (ref 5–15)
BUN: 37 mg/dL — ABNORMAL HIGH (ref 8–23)
CO2: 29 mmol/L (ref 22–32)
Calcium: 10.3 mg/dL (ref 8.9–10.3)
Chloride: 100 mmol/L (ref 98–111)
Creatinine, Ser: 1.25 mg/dL — ABNORMAL HIGH (ref 0.44–1.00)
GFR, Estimated: 43 mL/min — ABNORMAL LOW
Glucose, Bld: 86 mg/dL (ref 70–99)
Potassium: 4.8 mmol/L (ref 3.5–5.1)
Sodium: 141 mmol/L (ref 135–145)

## 2024-06-14 LAB — CBC
HCT: 41.8 % (ref 36.0–46.0)
Hemoglobin: 13.4 g/dL (ref 12.0–15.0)
MCH: 34.5 pg — ABNORMAL HIGH (ref 26.0–34.0)
MCHC: 32.1 g/dL (ref 30.0–36.0)
MCV: 107.7 fL — ABNORMAL HIGH (ref 80.0–100.0)
Platelets: 251 K/uL (ref 150–400)
RBC: 3.88 MIL/uL (ref 3.87–5.11)
RDW: 10.9 % — ABNORMAL LOW (ref 11.5–15.5)
WBC: 4.8 K/uL (ref 4.0–10.5)
nRBC: 0 % (ref 0.0–0.2)

## 2024-06-15 NOTE — Progress Notes (Signed)
 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  Per Dr. Signe: Very pleasant 78yo woman who had been suffering with partial obstruction for several months with associated weight loss due to diverticular stricture and presented with exacerbation and a small loculated pelvic abscess. She underwent hartman's and progressed very well after this, was discharged home on POD3. Unfortunately returned to the ER on POD 8 with recurrent abdominal pain and had a fluid collection on CT which was drained on 5/12 revealing hematoma- cultures negative. Discharged home again on POD 12. Drain was removed 5/22 with no fistula or residual fluid on CT drain study.  She reports she has been doing very well since then.  She has been eating well and reports she has gained 2 pounds.  She is here with her grandson and great-grandson who are her caregivers.  Now scheduled for ostomy reversal.  Seen by PCP on 02/10/24. Per Dr Cleotilde: Post colostomy-handling it well, needs reversal, appointment with surgeon in November, need to proceed with a reversal, good surgical candidate              Nutrition is normal   VS: BP 113/68   Pulse 78   Temp 36.9 C (Oral)   Resp 16   Ht 4' 11 (1.499 m)   Wt 35.4 kg   SpO2 100%   BMI 15.75 kg/m   PROVIDERS: Cleotilde Oneil FALCON, MD   LABS: Labs reviewed: Acceptable for surgery. CKD stable (all labs ordered are listed, but only abnormal results are displayed)  Labs Reviewed  BASIC METABOLIC PANEL WITH GFR - Abnormal; Notable for the following components:      Result Value   BUN 37 (*)    Creatinine, Ser 1.25 (*)    GFR,  Estimated 43 (*)    All other components within normal limits  CBC - Abnormal; Notable for the following components:   MCV 107.7 (*)    MCH 34.5 (*)    RDW 10.9 (*)    All other components within normal limits  TYPE AND SCREEN    Echo 11/20/2021 ([facility]):  INTERPRETATION NORMAL LEFT VENTRICULAR SYSTOLIC FUNCTION WITH AN ESTIMATED EF = >55 % NORMAL RIGHT VENTRICULAR SYSTOLIC FUNCTION MODERATE TRICUSPID VALVE INSUFFICIENCY MILD MITRAL VALVE INSUFFICIENCY TRACE AORTIC VALVE INSUFFICIENCY NO VALVULAR STENOSIS  Past Medical History:  Diagnosis Date   Arthritis    Colostomy in place [facility])    Esophageal reflux    Gastritis with bleeding    GERD (gastroesophageal reflux disease)    Hyperlipidemia, mixed    Immunodeficiency (cell-mediated)    Inflammatory polyarthropathy (HCC)    Lumbar stenosis with neurogenic claudication    Osteoporosis    Status post partial colectomy     Past Surgical History:  Procedure Laterality Date   ABDOMINAL HYSTERECTOMY     BREAST BIOPSY Left 06/08/2012   neg   CHOLECYSTECTOMY     COLON RESECTION SIGMOID N/A 10/08/2023   Procedure: COLECTOMY, SIGMOID, OPEN;  Surgeon: Signe Mitzie LABOR, MD;  Location: [facility] OR;  Service: General;  Laterality: N/A;   COLONOSCOPY N/A 03/01/2024   Procedure: COLONOSCOPY;  Surgeon: [place],  Teodoro K, MD;  Location: [facility] ENDOSCOPY;  Service: Gastroenterology;  Laterality: N/A;   COLONOSCOPY WITH PROPOFOL  N/A 07/21/2023   Procedure: COLONOSCOPY WITH PROPOFOL ;  Surgeon: Toledo, Ladell POUR, MD;  Location: [facility] ENDOSCOPY;  Service: Gastroenterology;  Laterality: N/A;   COLOSTOMY N/A 10/08/2023   Procedure: CREATION, COLOSTOMY;  Surgeon: Signe Mitzie LABOR, MD;  Location: [facility] OR;  Service: General;  Laterality: N/A;   ESOPHAGOGASTRODUODENOSCOPY (EGD) WITH PROPOFOL  N/A 10/14/2015   Procedure: ESOPHAGOGASTRODUODENOSCOPY (EGD) WITH PROPOFOL ;  Surgeon: Lamar ONEIDA Holmes, MD;  Location: [facility] ENDOSCOPY;  Service: Endoscopy;  Laterality:  N/A;   ESOPHAGOGASTRODUODENOSCOPY (EGD) WITH PROPOFOL  N/A 07/21/2023   Procedure: ESOPHAGOGASTRODUODENOSCOPY (EGD) WITH PROPOFOL ;  Surgeon: Toledo, Ladell POUR, MD;  Location: [facility] ENDOSCOPY;  Service: Gastroenterology;  Laterality: N/A;   Hartman's Procedure N/A    IR RADIOLOGIST EVAL & MGMT  10/28/2023   LAPAROTOMY N/A 10/08/2023   Procedure: LAPAROTOMY, EXPLORATORY;  Surgeon: Signe Mitzie LABOR, MD;  Location: [facility] OR;  Service: General;  Laterality: N/A;  [place] Intra-abdominal Abscess   POLYPECTOMY  03/01/2024   Procedure: POLYPECTOMY, INTESTINE;  Surgeon: Toledo, Ladell POUR, MD;  Location: [facility] ENDOSCOPY;  Service: Gastroenterology;;   TUMOR REMOVAL N/A    stomach    MEDICATIONS:  acetaminophen  (TYLENOL ) 500 MG tablet   Calcium  Carbonate-Vitamin D (CALCIUM  600 + D PO)   donepezil  (ARICEPT ) 5 MG tablet   feeding supplement (ENSURE ENLIVE / ENSURE PLUS) LIQD   fish oil-omega-3 fatty acids  1000 MG capsule   fluticasone (FLONASE) 50 MCG/ACT nasal spray   hydroxychloroquine (PLAQUENIL) 200 MG tablet   lovastatin (MEVACOR) 20 MG tablet   omeprazole (PRILOSEC) 40 MG capsule   oxyCODONE  (ROXICODONE ) 5 MG immediate release tablet   sucralfate (CARAFATE) 1 g tablet   torsemide (DEMADEX) 20 MG tablet   venlafaxine  XR (EFFEXOR -XR) 75 MG 24 hr capsule   No current facility-administered medications for this encounter.   Burnard CHRISTELLA Odis DEVONNA [facility] Surgical Short Stay/Anesthesiology [facility] Phone [phone] 06/15/2024 2:31 PM       "

## 2024-06-15 NOTE — Anesthesia Preprocedure Evaluation (Signed)
"                                    Anesthesia Evaluation  Patient identified by MRN, date of birth, ID band Patient awake    Reviewed: Allergy & Precautions, NPO status , Patient's Chart, lab work & pertinent test results, reviewed documented beta blocker date and time   History of Anesthesia Complications Negative for: history of anesthetic complications  Airway Mallampati: II  TM Distance: >3 FB Neck ROM: Full    Dental  (+) Dental Advisory Given   Pulmonary neg pulmonary ROS   breath sounds clear to auscultation       Cardiovascular hypertension, Pt. on medications and Pt. on home beta blockers (-) angina + Valvular Problems/Murmurs (mild) MR and MVP  Rhythm:Regular Rate:Normal  05/29/2024 Stress:  Findings are consistent with no ischemia and no infarction. The study is low risk.   No ST deviation was noted.   Left ventricular function is normal. Nuclear stress EF: 75%  08/2023 ECHO: EF 60 to 65%.  1.The LV has normal function, no regional wall motion abnormalities. Left ventricular diastolic parameters were normal. The average left ventricular  global longitudinal strain is -21.5 %. The global longitudinal strain is  normal.   2. RVF is normal. The right ventricular size is normal.   3. There is normal pulmonary artery systolic pressure. The estimated  right ventricular systolic pressure is 33.2 mmHg.   4. Left atrial size was mildly dilated.   5. Right atrial size was mildly dilated.   6. The mitral valve is grossly normal. Mild mitral valve regurgitation. No evidence of mitral stenosis.   7. The aortic valve is tricuspid. Aortic valve regurgitation is not visualized. No aortic stenosis is present.     Neuro/Psych negative neurological ROS     GI/Hepatic Neg liver ROS,GERD  Medicated and Controlled,,  Endo/Other  negative endocrine ROS    Renal/GU negative Renal ROS     Musculoskeletal   Abdominal   Peds  Hematology Hb 13.1, plt 195k    Anesthesia Other Findings   Reproductive/Obstetrics                              Anesthesia Physical Anesthesia Plan  ASA: 3  Anesthesia Plan: Spinal   Post-op Pain Management: Tylenol  PO (pre-op)*   Induction:   PONV Risk Score and Plan: 2 and Treatment may vary due to age or medical condition  Airway Management Planned: Natural Airway and Simple Face Mask  Additional Equipment: None  Intra-op Plan:   Post-operative Plan:   Informed Consent: I have reviewed the patients History and Physical, chart, labs and discussed the procedure including the risks, benefits and alternatives for the proposed anesthesia with the patient or authorized representative who has indicated his/her understanding and acceptance.     Dental advisory given  Plan Discussed with: CRNA and Surgeon  Anesthesia Plan Comments: (See PAT note from 1/7)         Anesthesia Quick Evaluation  "

## 2024-06-23 ENCOUNTER — Encounter (HOSPITAL_COMMUNITY): Admitting: Anesthesiology

## 2024-06-23 ENCOUNTER — Encounter (HOSPITAL_COMMUNITY)
Admission: RE | Disposition: A | Discharge status: Home / Self Care | Payer: Self-pay | Source: Ambulatory Visit | Attending: Surgery

## 2024-06-23 ENCOUNTER — Encounter (HOSPITAL_COMMUNITY): Payer: Self-pay | Admitting: Surgery

## 2024-06-23 ENCOUNTER — Encounter (HOSPITAL_COMMUNITY): Admitting: Medical

## 2024-06-23 ENCOUNTER — Inpatient Hospital Stay (HOSPITAL_COMMUNITY)
Admission: RE | Admit: 2024-06-23 | Discharge: 2024-06-23 | Disposition: A | Discharge status: Home / Self Care | Source: Ambulatory Visit | Attending: Surgery | Admitting: Surgery

## 2024-06-23 DIAGNOSIS — Z01818 Encounter for other preprocedural examination: Principal | ICD-10-CM

## 2024-06-23 DIAGNOSIS — Z538 Procedure and treatment not carried out for other reasons: Secondary | ICD-10-CM | POA: Insufficient documentation

## 2024-06-23 LAB — TYPE AND SCREEN
ABO/RH(D): A POS
Antibody Screen: NEGATIVE

## 2024-06-23 LAB — GLUCOSE, CAPILLARY: Glucose-Capillary: 98 mg/dL (ref 70–99)

## 2024-06-23 SURGERY — CLOSURE, COLOSTOMY
Anesthesia: General

## 2024-06-23 MED ORDER — BUPIVACAINE-EPINEPHRINE (PF) 0.25% -1:200000 IJ SOLN
INTRAMUSCULAR | Status: AC
  Filled 2024-06-23: qty 30

## 2024-06-23 MED ORDER — CEFAZOLIN SODIUM-DEXTROSE 2-4 GM/100ML-% IV SOLN: 2.0000 g | INTRAVENOUS | Status: DC

## 2024-06-23 MED ORDER — ORAL CARE MOUTH RINSE: 15.0000 mL | Freq: Once | OROMUCOSAL | Status: AC

## 2024-06-23 MED ORDER — ONDANSETRON HCL 4 MG/2ML IJ SOLN
INTRAMUSCULAR | Status: AC
  Filled 2024-06-23: qty 2

## 2024-06-23 MED ORDER — FENTANYL CITRATE (PF) 100 MCG/2ML IJ SOLN
INTRAMUSCULAR | Status: AC
  Filled 2024-06-23: qty 2

## 2024-06-23 MED ORDER — CHLORHEXIDINE GLUCONATE 4 % EX SOLN: 60.0000 mL | Freq: Once | CUTANEOUS | Status: DC

## 2024-06-23 MED ORDER — ACETAMINOPHEN 500 MG PO TABS
1000.0000 mg | ORAL_TABLET | ORAL | Status: AC
  Administered 2024-06-23: 1000 mg via ORAL
  Filled 2024-06-23: qty 2

## 2024-06-23 MED ORDER — INSULIN ASPART 100 UNIT/ML IJ SOLN: 0.0000 [IU] | INTRAMUSCULAR | Status: DC | PRN

## 2024-06-23 MED ORDER — SODIUM CHLORIDE 0.9 % IV SOLN
2.0000 g | INTRAVENOUS | Status: DC
  Filled 2024-06-23: qty 2

## 2024-06-23 MED ORDER — ALVIMOPAN 12 MG PO CAPS
12.0000 mg | ORAL_CAPSULE | ORAL | Status: AC
  Administered 2024-06-23: 12 mg via ORAL
  Filled 2024-06-23: qty 1

## 2024-06-23 MED ORDER — CHLORHEXIDINE GLUCONATE 0.12 % MT SOLN
15.0000 mL | Freq: Once | OROMUCOSAL | Status: AC
  Administered 2024-06-23: 15 mL via OROMUCOSAL

## 2024-06-23 MED ORDER — LACTATED RINGERS IV SOLN: INTRAVENOUS | Status: DC

## 2024-06-23 MED ORDER — PROPOFOL 500 MG/50ML IV EMUL
INTRAVENOUS | Status: AC
  Filled 2024-06-23: qty 50

## 2024-06-23 NOTE — H&P (View-Only) (Signed)
 Vanessa Cisneros [id number]   Unfortunately, patient did not do her bowel prep that was ordered and discussed last month at her office visit. We discussed the importance of this as a standard of care with the goal of reducing perioperative morbidity, for which she is already at increased risk. Surgery will be rescheduled.   Mitzie Freund, MD FACS

## 2024-06-23 NOTE — H&P (Addendum)
 Vanessa Cisneros [id number]   Unfortunately, patient did not do her bowel prep that was ordered and discussed last month at her office visit. We discussed the importance of this as a standard of care with the goal of reducing perioperative morbidity, for which she is already at increased risk. Surgery will be rescheduled.   Mitzie Freund, MD FACS

## 2024-06-26 ENCOUNTER — Encounter (HOSPITAL_COMMUNITY): Payer: Self-pay | Admitting: Surgery

## 2024-06-26 ENCOUNTER — Other Ambulatory Visit: Payer: Self-pay

## 2024-06-26 ENCOUNTER — Ambulatory Visit: Payer: Self-pay | Admitting: Surgery

## 2024-06-26 DIAGNOSIS — Z933 Colostomy status: Secondary | ICD-10-CM

## 2024-06-26 NOTE — Progress Notes (Addendum)
 Anesthesia Review:  PCP: 02/10/24- DR Oneil Pinal.  Cardiologist :  PPM/ ICD: Device Orders: Rep Notified:  Chest x-ray : 12/13/23- 2 view  EKG : 12/13/23  Echo : Stress test: Cardiac Cath :   Activity level:  Sleep Study/ CPAP : Fasting Blood Sugar :      / Checks Blood Sugar -- times a day:    Blood Thinner/ Instructions /Last Dose: ASA / Instructions/ Last Dose :    Surgery was cancelled on 06/23/2024 due to pt did not do her bowel prep.     06/14/2024- cbc and BMP in epic.    Called pt on 06/26/2024.  Updated med hx.  Pt states at time of phone call she never received any bowel prep instructions the first time the surgery was scheduled.  Spoke with grandson, Masey Scheiber and he states pt never received any bowel prep instructions.  Informed pt and grandson that preop nruse would call office AT [facility] and inform them and for them to call grandson, Alexarae Oliva at [phone].  And to review bowel prep instructions with him.  Completed preop instructions otherwise.  Called [facility] and had to LVMM for them in regards to above.  Called and spoke with pt and pt is aware that preop nurse had to leave a message with [facility] in Triage and they would call her tomorrow.  PT voiced understanding.

## 2024-07-01 NOTE — Anesthesia Preprocedure Evaluation (Signed)
"                                    Anesthesia Evaluation  Patient identified by MRN, date of birth, ID band Patient awake    Reviewed: Allergy & Precautions, NPO status , Patient's Chart, lab work & pertinent test results  Airway Mallampati: II  TM Distance: >3 FB Neck ROM: Full    Dental no notable dental hx.    Pulmonary neg pulmonary ROS   Pulmonary exam normal        Cardiovascular negative cardio ROS Normal cardiovascular exam     Neuro/Psych  PSYCHIATRIC DISORDERS  Depression    negative neurological ROS     GI/Hepatic Neg liver ROS,GERD  Medicated,,  Endo/Other  negative endocrine ROS    Renal/GU Renal disease     Musculoskeletal   Abdominal   Peds  Hematology negative hematology ROS (+)   Anesthesia Other Findings COLOSTOMY  Reproductive/Obstetrics                              Anesthesia Physical Anesthesia Plan  ASA: 2  Anesthesia Plan: General   Post-op Pain Management:    Induction: Intravenous  PONV Risk Score and Plan: 3 and Ondansetron , Dexamethasone  and Treatment may vary due to age or medical condition  Airway Management Planned: Oral ETT  Additional Equipment:   Intra-op Plan:   Post-operative Plan: Extubation in OR  Informed Consent: I have reviewed the patients History and Physical, chart, labs and discussed the procedure including the risks, benefits and alternatives for the proposed anesthesia with the patient or authorized representative who has indicated his/her understanding and acceptance.     Dental advisory given  Plan Discussed with: CRNA  Anesthesia Plan Comments:          Anesthesia Quick Evaluation  "

## 2024-07-03 ENCOUNTER — Encounter (HOSPITAL_COMMUNITY)
Admission: RE | Disposition: A | Discharge status: Home / Self Care | Payer: Self-pay | Source: Home / Self Care | Attending: Surgery

## 2024-07-03 ENCOUNTER — Inpatient Hospital Stay (HOSPITAL_COMMUNITY)
Admission: RE | Admit: 2024-07-03 | Discharge: 2024-07-07 | DRG: 330 | Disposition: A | Discharge status: Home / Self Care | Attending: Surgery | Admitting: Surgery

## 2024-07-03 ENCOUNTER — Other Ambulatory Visit: Payer: Self-pay

## 2024-07-03 ENCOUNTER — Encounter (HOSPITAL_COMMUNITY): Admitting: Certified Registered Nurse Anesthetist

## 2024-07-03 ENCOUNTER — Encounter (HOSPITAL_COMMUNITY): Payer: Self-pay | Admitting: Surgery

## 2024-07-03 DIAGNOSIS — Z433 Encounter for attention to colostomy: Principal | ICD-10-CM

## 2024-07-03 DIAGNOSIS — Z681 Body mass index (BMI) 19 or less, adult: Secondary | ICD-10-CM

## 2024-07-03 DIAGNOSIS — N179 Acute kidney failure, unspecified: Secondary | ICD-10-CM | POA: Diagnosis not present

## 2024-07-03 DIAGNOSIS — Z9049 Acquired absence of other specified parts of digestive tract: Secondary | ICD-10-CM

## 2024-07-03 DIAGNOSIS — Z881 Allergy status to other antibiotic agents status: Secondary | ICD-10-CM

## 2024-07-03 DIAGNOSIS — K660 Peritoneal adhesions (postprocedural) (postinfection): Secondary | ICD-10-CM | POA: Diagnosis present

## 2024-07-03 DIAGNOSIS — Z885 Allergy status to narcotic agent status: Secondary | ICD-10-CM

## 2024-07-03 DIAGNOSIS — M810 Age-related osteoporosis without current pathological fracture: Secondary | ICD-10-CM | POA: Diagnosis present

## 2024-07-03 DIAGNOSIS — E782 Mixed hyperlipidemia: Secondary | ICD-10-CM | POA: Diagnosis present

## 2024-07-03 DIAGNOSIS — R636 Underweight: Secondary | ICD-10-CM | POA: Diagnosis present

## 2024-07-03 DIAGNOSIS — D8489 Other immunodeficiencies: Secondary | ICD-10-CM | POA: Diagnosis present

## 2024-07-03 DIAGNOSIS — Z01818 Encounter for other preprocedural examination: Principal | ICD-10-CM

## 2024-07-03 DIAGNOSIS — K219 Gastro-esophageal reflux disease without esophagitis: Secondary | ICD-10-CM | POA: Diagnosis present

## 2024-07-03 DIAGNOSIS — Z933 Colostomy status: Secondary | ICD-10-CM

## 2024-07-03 DIAGNOSIS — E876 Hypokalemia: Secondary | ICD-10-CM | POA: Diagnosis present

## 2024-07-03 LAB — HEMOGLOBIN A1C
Hgb A1c MFr Bld: 5.3 % (ref 4.8–5.6)
Mean Plasma Glucose: 105.41 mg/dL

## 2024-07-03 LAB — COMPREHENSIVE METABOLIC PANEL WITH GFR
ALT: 21 U/L (ref 0–44)
AST: 31 U/L (ref 15–41)
Albumin: 3.6 g/dL (ref 3.5–5.0)
Alkaline Phosphatase: 41 U/L (ref 38–126)
Anion gap: 13 (ref 5–15)
BUN: 26 mg/dL — ABNORMAL HIGH (ref 8–23)
CO2: 22 mmol/L (ref 22–32)
Calcium: 8.6 mg/dL — ABNORMAL LOW (ref 8.9–10.3)
Chloride: 106 mmol/L (ref 98–111)
Creatinine, Ser: 1.15 mg/dL — ABNORMAL HIGH (ref 0.44–1.00)
GFR, Estimated: 48 mL/min — ABNORMAL LOW
Glucose, Bld: 176 mg/dL — ABNORMAL HIGH (ref 70–99)
Potassium: 3.6 mmol/L (ref 3.5–5.1)
Sodium: 141 mmol/L (ref 135–145)
Total Bilirubin: <0.2 mg/dL (ref 0.0–1.2)
Total Protein: 5.6 g/dL — ABNORMAL LOW (ref 6.5–8.1)

## 2024-07-03 LAB — CBC WITH DIFFERENTIAL/PLATELET
Abs Immature Granulocytes: 0.05 10*3/uL (ref 0.00–0.07)
Basophils Absolute: 0 10*3/uL (ref 0.0–0.1)
Basophils Relative: 0 %
Eosinophils Absolute: 0 10*3/uL (ref 0.0–0.5)
Eosinophils Relative: 0 %
HCT: 34.6 % — ABNORMAL LOW (ref 36.0–46.0)
Hemoglobin: 11.5 g/dL — ABNORMAL LOW (ref 12.0–15.0)
Immature Granulocytes: 1 %
Lymphocytes Relative: 6 %
Lymphs Abs: 0.6 10*3/uL — ABNORMAL LOW (ref 0.7–4.0)
MCH: 34.3 pg — ABNORMAL HIGH (ref 26.0–34.0)
MCHC: 33.2 g/dL (ref 30.0–36.0)
MCV: 103.3 fL — ABNORMAL HIGH (ref 80.0–100.0)
Monocytes Absolute: 0.6 10*3/uL (ref 0.1–1.0)
Monocytes Relative: 6 %
Neutro Abs: 9.3 10*3/uL — ABNORMAL HIGH (ref 1.7–7.7)
Neutrophils Relative %: 87 %
Platelets: 278 10*3/uL (ref 150–400)
RBC: 3.35 MIL/uL — ABNORMAL LOW (ref 3.87–5.11)
RDW: 11.6 % (ref 11.5–15.5)
WBC: 10.6 10*3/uL — ABNORMAL HIGH (ref 4.0–10.5)
nRBC: 0 % (ref 0.0–0.2)

## 2024-07-03 LAB — TYPE AND SCREEN
ABO/RH(D): A POS
Antibody Screen: NEGATIVE

## 2024-07-03 LAB — PROTIME-INR
INR: 1 (ref 0.8–1.2)
Prothrombin Time: 13.8 s (ref 11.4–15.2)

## 2024-07-03 MED ORDER — HYDRALAZINE HCL 20 MG/ML IJ SOLN: 10.0000 mg | INTRAMUSCULAR | Status: DC | PRN

## 2024-07-03 MED ORDER — DEXAMETHASONE SOD PHOSPHATE PF 10 MG/ML IJ SOLN
INTRAMUSCULAR | Status: AC
  Filled 2024-07-03: qty 1

## 2024-07-03 MED ORDER — STERILE WATER FOR INJECTION IJ SOLN
INTRAMUSCULAR | Status: AC
  Filled 2024-07-03: qty 10

## 2024-07-03 MED ORDER — CHLORHEXIDINE GLUCONATE 0.12 % MT SOLN
15.0000 mL | Freq: Once | OROMUCOSAL | Status: AC
  Administered 2024-07-03: 15 mL via OROMUCOSAL

## 2024-07-03 MED ORDER — SUGAMMADEX SODIUM 200 MG/2ML IV SOLN
INTRAVENOUS | Status: AC
  Filled 2024-07-03: qty 2

## 2024-07-03 MED ORDER — CHLORHEXIDINE GLUCONATE 4 % EX SOLN: 60.0000 mL | Freq: Once | CUTANEOUS | Status: DC

## 2024-07-03 MED ORDER — DEXAMETHASONE SOD PHOSPHATE PF 10 MG/ML IJ SOLN
INTRAMUSCULAR | Status: DC | PRN
  Administered 2024-07-03: 5 mg via INTRAVENOUS

## 2024-07-03 MED ORDER — SUCRALFATE 1 G PO TABS
1.0000 g | ORAL_TABLET | Freq: Two times a day (BID) | ORAL | Status: DC
  Administered 2024-07-03 – 2024-07-07 (×8): 1 g via ORAL
  Filled 2024-07-03 (×8): qty 1

## 2024-07-03 MED ORDER — STERILE WATER FOR IRRIGATION IR SOLN: Status: DC | PRN

## 2024-07-03 MED ORDER — DIPHENHYDRAMINE HCL 12.5 MG/5ML PO ELIX: 12.5000 mg | ORAL_SOLUTION | Freq: Four times a day (QID) | ORAL | Status: DC | PRN

## 2024-07-03 MED ORDER — VENLAFAXINE HCL ER 37.5 MG PO CP24
37.5000 mg | ORAL_CAPSULE | Freq: Every day | ORAL | Status: DC
  Administered 2024-07-04 – 2024-07-07 (×4): 37.5 mg via ORAL
  Filled 2024-07-03 (×4): qty 1

## 2024-07-03 MED ORDER — FENTANYL CITRATE (PF) 100 MCG/2ML IJ SOLN
INTRAMUSCULAR | Status: AC
  Filled 2024-07-03: qty 2

## 2024-07-03 MED ORDER — ONDANSETRON HCL 4 MG/2ML IJ SOLN
INTRAMUSCULAR | Status: DC | PRN
  Administered 2024-07-03: 4 mg via INTRAVENOUS

## 2024-07-03 MED ORDER — PANTOPRAZOLE SODIUM 40 MG PO TBEC
40.0000 mg | DELAYED_RELEASE_TABLET | Freq: Every day | ORAL | Status: DC
  Administered 2024-07-03 – 2024-07-07 (×5): 40 mg via ORAL
  Filled 2024-07-03 (×6): qty 1

## 2024-07-03 MED ORDER — PROPOFOL 10 MG/ML IV BOLUS
INTRAVENOUS | Status: AC
  Filled 2024-07-03: qty 20

## 2024-07-03 MED ORDER — ROCURONIUM BROMIDE 10 MG/ML (PF) SYRINGE
PREFILLED_SYRINGE | INTRAVENOUS | Status: AC
  Filled 2024-07-03: qty 10

## 2024-07-03 MED ORDER — EPHEDRINE 5 MG/ML INJ
INTRAVENOUS | Status: AC
  Filled 2024-07-03: qty 5

## 2024-07-03 MED ORDER — STERILE WATER FOR IRRIGATION IR SOLN
Status: DC | PRN
  Administered 2024-07-03: 1000 mL

## 2024-07-03 MED ORDER — SUGAMMADEX SODIUM 200 MG/2ML IV SOLN
INTRAVENOUS | Status: DC | PRN
  Administered 2024-07-03: 200 mg via INTRAVENOUS

## 2024-07-03 MED ORDER — HEPARIN SODIUM (PORCINE) 5000 UNIT/ML IJ SOLN
5000.0000 [IU] | Freq: Once | INTRAMUSCULAR | Status: AC
  Administered 2024-07-03: 5000 [IU] via SUBCUTANEOUS
  Filled 2024-07-03: qty 1

## 2024-07-03 MED ORDER — PHENYLEPHRINE 80 MCG/ML (10ML) SYRINGE FOR IV PUSH (FOR BLOOD PRESSURE SUPPORT)
PREFILLED_SYRINGE | INTRAVENOUS | Status: DC | PRN
  Administered 2024-07-03 (×4): 80 ug via INTRAVENOUS
  Administered 2024-07-03: 40 ug via INTRAVENOUS
  Administered 2024-07-03: 120 ug via INTRAVENOUS
  Administered 2024-07-03: 80 ug via INTRAVENOUS

## 2024-07-03 MED ORDER — ACETAMINOPHEN 500 MG PO TABS
1000.0000 mg | ORAL_TABLET | ORAL | Status: AC
  Administered 2024-07-03: 1000 mg via ORAL
  Filled 2024-07-03: qty 2

## 2024-07-03 MED ORDER — ONDANSETRON HCL 4 MG/2ML IJ SOLN
4.0000 mg | Freq: Once | INTRAMUSCULAR | Status: AC | PRN
  Administered 2024-07-03: 4 mg via INTRAVENOUS

## 2024-07-03 MED ORDER — DIPHENHYDRAMINE HCL 50 MG/ML IJ SOLN: 12.5000 mg | Freq: Four times a day (QID) | INTRAMUSCULAR | Status: DC | PRN

## 2024-07-03 MED ORDER — LIDOCAINE HCL (PF) 2 % IJ SOLN
INTRAMUSCULAR | Status: AC
  Filled 2024-07-03: qty 5

## 2024-07-03 MED ORDER — ALUM & MAG HYDROXIDE-SIMETH 200-200-20 MG/5ML PO SUSP: 30.0000 mL | Freq: Four times a day (QID) | ORAL | Status: DC | PRN

## 2024-07-03 MED ORDER — FENTANYL CITRATE (PF) 50 MCG/ML IJ SOSY
PREFILLED_SYRINGE | INTRAMUSCULAR | Status: AC
  Filled 2024-07-03: qty 1

## 2024-07-03 MED ORDER — MIRTAZAPINE 15 MG PO TABS
7.5000 mg | ORAL_TABLET | Freq: Every day | ORAL | Status: DC
  Administered 2024-07-03 – 2024-07-06 (×4): 7.5 mg via ORAL
  Filled 2024-07-03 (×4): qty 1

## 2024-07-03 MED ORDER — SODIUM CHLORIDE 0.9 % IV SOLN
2.0000 g | Freq: Two times a day (BID) | INTRAVENOUS | Status: AC
  Administered 2024-07-03: 2 g via INTRAVENOUS
  Filled 2024-07-03: qty 2

## 2024-07-03 MED ORDER — TORSEMIDE 10 MG PO TABS
10.0000 mg | ORAL_TABLET | Freq: Every day | ORAL | Status: DC
  Administered 2024-07-04: 10 mg via ORAL
  Filled 2024-07-03 (×2): qty 1

## 2024-07-03 MED ORDER — ONDANSETRON HCL 4 MG/2ML IJ SOLN
INTRAMUSCULAR | Status: AC
  Filled 2024-07-03: qty 2

## 2024-07-03 MED ORDER — ALVIMOPAN 12 MG PO CAPS
12.0000 mg | ORAL_CAPSULE | Freq: Two times a day (BID) | ORAL | Status: DC
  Administered 2024-07-04 – 2024-07-05 (×4): 12 mg via ORAL
  Filled 2024-07-03 (×5): qty 1

## 2024-07-03 MED ORDER — EPHEDRINE SULFATE (PRESSORS) 25 MG/5ML IV SOSY
PREFILLED_SYRINGE | INTRAVENOUS | Status: DC | PRN
  Administered 2024-07-03 (×2): 5 mg via INTRAVENOUS

## 2024-07-03 MED ORDER — ENSURE SURGERY PO LIQD
237.0000 mL | Freq: Two times a day (BID) | ORAL | Status: DC
  Administered 2024-07-06: 237 mL via ORAL

## 2024-07-03 MED ORDER — ENOXAPARIN SODIUM 30 MG/0.3ML IJ SOSY
30.0000 mg | PREFILLED_SYRINGE | INTRAMUSCULAR | Status: DC
  Administered 2024-07-04 – 2024-07-05 (×2): 30 mg via SUBCUTANEOUS
  Filled 2024-07-03 (×2): qty 0.3

## 2024-07-03 MED ORDER — ONDANSETRON HCL 4 MG PO TABS: 4.0000 mg | ORAL_TABLET | Freq: Four times a day (QID) | ORAL | Status: DC | PRN

## 2024-07-03 MED ORDER — SODIUM CHLORIDE 0.9 % IV SOLN: INTRAVENOUS | Status: DC

## 2024-07-03 MED ORDER — HYDROMORPHONE HCL 1 MG/ML IJ SOLN
0.5000 mg | INTRAMUSCULAR | Status: DC | PRN
  Administered 2024-07-03 – 2024-07-04 (×5): 0.5 mg via INTRAVENOUS
  Filled 2024-07-03 (×5): qty 0.5

## 2024-07-03 MED ORDER — FENTANYL CITRATE (PF) 50 MCG/ML IJ SOSY
25.0000 ug | PREFILLED_SYRINGE | INTRAMUSCULAR | Status: DC | PRN
  Administered 2024-07-03: 25 ug via INTRAVENOUS
  Administered 2024-07-03: 50 ug via INTRAVENOUS
  Administered 2024-07-03: 25 ug via INTRAVENOUS

## 2024-07-03 MED ORDER — ENSURE PRE-SURGERY PO LIQD
592.0000 mL | Freq: Once | ORAL | Status: DC
  Filled 2024-07-03: qty 592

## 2024-07-03 MED ORDER — VENLAFAXINE HCL ER 37.5 MG PO CP24: 75.0000 mg | ORAL_CAPSULE | Freq: Every day | ORAL | Status: DC

## 2024-07-03 MED ORDER — LIDOCAINE HCL (PF) 2 % IJ SOLN
INTRAMUSCULAR | Status: DC | PRN
  Administered 2024-07-03: 40 mg via INTRADERMAL

## 2024-07-03 MED ORDER — LACTATED RINGERS IV SOLN: INTRAVENOUS | Status: DC

## 2024-07-03 MED ORDER — ALVIMOPAN 12 MG PO CAPS
12.0000 mg | ORAL_CAPSULE | ORAL | Status: AC
  Administered 2024-07-03: 12 mg via ORAL
  Filled 2024-07-03: qty 1

## 2024-07-03 MED ORDER — BUPIVACAINE-EPINEPHRINE (PF) 0.25% -1:200000 IJ SOLN
INTRAMUSCULAR | Status: AC
  Filled 2024-07-03: qty 30

## 2024-07-03 MED ORDER — SODIUM CHLORIDE 0.9 % IV SOLN
2.0000 g | INTRAVENOUS | Status: AC
  Administered 2024-07-03: 2 g via INTRAVENOUS
  Filled 2024-07-03: qty 2

## 2024-07-03 MED ORDER — METOPROLOL TARTRATE 5 MG/5ML IV SOLN: 5.0000 mg | Freq: Four times a day (QID) | INTRAVENOUS | Status: DC | PRN

## 2024-07-03 MED ORDER — DONEPEZIL HCL 10 MG PO TABS
5.0000 mg | ORAL_TABLET | Freq: Every day | ORAL | Status: DC
  Administered 2024-07-03 – 2024-07-06 (×4): 5 mg via ORAL
  Filled 2024-07-03 (×4): qty 1

## 2024-07-03 MED ORDER — ORAL CARE MOUTH RINSE: 15.0000 mL | Freq: Once | OROMUCOSAL | Status: AC

## 2024-07-03 MED ORDER — ROCURONIUM BROMIDE 10 MG/ML (PF) SYRINGE
PREFILLED_SYRINGE | INTRAVENOUS | Status: DC | PRN
  Administered 2024-07-03: 20 mg via INTRAVENOUS
  Administered 2024-07-03: 15 mg via INTRAVENOUS
  Administered 2024-07-03 (×2): 10 mg via INTRAVENOUS
  Administered 2024-07-03: 15 mg via INTRAVENOUS
  Administered 2024-07-03: 40 mg via INTRAVENOUS

## 2024-07-03 MED ORDER — PROPOFOL 10 MG/ML IV BOLUS
INTRAVENOUS | Status: DC | PRN
  Administered 2024-07-03: 100 mg via INTRAVENOUS

## 2024-07-03 MED ORDER — ONDANSETRON HCL 4 MG/2ML IJ SOLN
4.0000 mg | Freq: Four times a day (QID) | INTRAMUSCULAR | Status: DC | PRN
  Administered 2024-07-03 – 2024-07-04 (×3): 4 mg via INTRAVENOUS
  Filled 2024-07-03 (×3): qty 2

## 2024-07-03 MED ORDER — OXYCODONE-ACETAMINOPHEN 5-325 MG PO TABS: 1.0000 | ORAL_TABLET | ORAL | Status: DC | PRN

## 2024-07-03 MED ORDER — AMISULPRIDE (ANTIEMETIC) 5 MG/2ML IV SOLN
INTRAVENOUS | Status: AC
  Filled 2024-07-03: qty 4

## 2024-07-03 MED ORDER — TORSEMIDE 20 MG PO TABS: 30.0000 mg | ORAL_TABLET | Freq: Every day | ORAL | Status: DC

## 2024-07-03 MED ORDER — PHENYLEPHRINE 80 MCG/ML (10ML) SYRINGE FOR IV PUSH (FOR BLOOD PRESSURE SUPPORT)
PREFILLED_SYRINGE | INTRAVENOUS | Status: AC
  Filled 2024-07-03: qty 10

## 2024-07-03 MED ORDER — ACETAMINOPHEN 500 MG PO TABS
1000.0000 mg | ORAL_TABLET | Freq: Four times a day (QID) | ORAL | Status: DC
  Administered 2024-07-04 – 2024-07-07 (×13): 1000 mg via ORAL
  Filled 2024-07-03 (×15): qty 2

## 2024-07-03 MED ORDER — BUPIVACAINE-EPINEPHRINE (PF) 0.25% -1:200000 IJ SOLN
INTRAMUSCULAR | Status: DC | PRN
  Administered 2024-07-03: 15 mL

## 2024-07-03 MED ORDER — ENSURE PRE-SURGERY PO LIQD
296.0000 mL | Freq: Once | ORAL | Status: DC
  Filled 2024-07-03: qty 296

## 2024-07-03 MED ORDER — 0.9 % SODIUM CHLORIDE (POUR BTL) OPTIME
TOPICAL | Status: DC | PRN
  Administered 2024-07-03 (×2): 1000 mL

## 2024-07-03 MED ORDER — LACTATED RINGERS IR SOLN
Status: DC | PRN
  Administered 2024-07-03: 1

## 2024-07-03 MED ORDER — AMISULPRIDE (ANTIEMETIC) 5 MG/2ML IV SOLN
10.0000 mg | Freq: Once | INTRAVENOUS | Status: AC | PRN
  Administered 2024-07-03: 10 mg via INTRAVENOUS

## 2024-07-03 MED ORDER — FENTANYL CITRATE (PF) 100 MCG/2ML IJ SOLN
INTRAMUSCULAR | Status: DC | PRN
  Administered 2024-07-03: 25 ug via INTRAVENOUS
  Administered 2024-07-03 (×2): 50 ug via INTRAVENOUS
  Administered 2024-07-03: 25 ug via INTRAVENOUS
  Administered 2024-07-03 (×2): 50 ug via INTRAVENOUS
  Administered 2024-07-03 (×2): 25 ug via INTRAVENOUS

## 2024-07-03 NOTE — Progress Notes (Signed)
 Came by to dangle pt. C/o nausea, antiemetic given. Will report to next shift.

## 2024-07-03 NOTE — Plan of Care (Signed)
" °  Problem: Clinical Measurements: Goal: Postoperative complications will be avoided or minimized Outcome: Progressing   Problem: Skin Integrity: Goal: Will show signs of wound healing Outcome: Progressing   Problem: Clinical Measurements: Goal: Postoperative complications will be avoided or minimized Outcome: Progressing   "

## 2024-07-03 NOTE — Anesthesia Postprocedure Evaluation (Signed)
"   Anesthesia Post Note  Patient: Vanessa Cisneros  Procedure(s) Performed: RESECTION, RECTUM, LOW ANTERIOR, OPEN (Abdomen)     Patient location during evaluation: PACU Anesthesia Type: General Level of consciousness: awake Pain management: pain level controlled Vital Signs Assessment: post-procedure vital signs reviewed and stable Respiratory status: spontaneous breathing, nonlabored ventilation and respiratory function stable Cardiovascular status: blood pressure returned to baseline and stable Postop Assessment: no apparent nausea or vomiting Anesthetic complications: no   No notable events documented.  Last Vitals:  Vitals:   07/03/24 1330 07/03/24 1345  BP: (!) 147/61 (!) 148/63  Pulse: 99 100  Resp: 10 16  Temp:  (!) 36.3 C  SpO2: 97% 98%    Last Pain:  Vitals:   07/03/24 1345  TempSrc:   PainSc: Asleep                 Brittain Smithey P Aylan Bayona      "

## 2024-07-03 NOTE — Op Note (Signed)
 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  Patient was repositioned in dorsal lithotomy with all pressure points appropriately padded.  A Foley catheter was placed under sterile conditions.  The abdomen was prepped and draped in usual sterile fashion.  A low midline laparotomy was then created and the peritoneal cavity carefully entered sharply.  Immediately noted small bowel adhesions to the anterior abdominal wall.  Adhesiolysis with the Metzenbaums and sparing use of cautery only on thin tissue well away from small bowel serosa ensued until we were able to clear the anterior abdominal wall sufficiently to place an Horticulturist, commercial.  Dense small bowel adhesions continued into the pelvis and we proceeded to lyse these carefully with Metzenbaums and some blunt dissection.  This was a very tedious adhesiolysis and ultimately we identified a loop of small bowel that was inseparable from the rectal stump staple line.  We attempted multiple no maneuvers to free this but ultimately an unavoidable enterotomy on the small bowel was made and a small patch of small bowel wall  was left on the rectal stump.   The small bowel was then able to be eviscerated.  Interloop adhesions were lysed sharply in order to create sufficient space for small bowel resection which was then performed with serial fires of the blue load 75 mm Endo GIA stapler.  The common enterotomy was closed with a TA 60 stapler.  3-0 silk seromuscular sutures were placed at the apex of the staple line as well as to imbricate the corners of the TA 60 staple line.  The mesenteric defect was closed with interrupted figure-of-eight 3-0 silk sutures.  On completion the small bowel appears viable, well-perfused and the anastomosis appears healthy, patent and is free of any tension.  At this point the small bowel was run from the ileocecal valve to the ligament of Treitz and confirmed to be free of any other injury or obstructing process.   We returned to the pelvis and the rectal stump.  Rectal stump was mobilized as much as possible but was fairly encased in cicatrix in the pelvis and was somewhat fixed.  Dr. Silversmith went below and performed a digital exam, removing inspissated mucus and then attempted to pass a small dilator.  It was noted that where the rectal stump , the lumen is quite narrow and a dilator was not able to pass through this.  I attempted to further mobilize the rectal stump however reached a point where I was not confident I could do this without injury to the rectum which would make any possible future intervention much more difficult.  At this point given that she has already had an extensive adhesiolysis and small bowel resection, I did not feel that it was safe to proceed with  continued dissection of the rectal stump as I would not be confident about the integrity of a colorectal anastomosis in this setting.   The abdomen was once again inspected and irrigated with warm sterile saline.  Hemostasis was ensured.  There did not appear to be any injury to the rectum at this time.  The small bowel was again  run in its entirety and confirmed to be free of any injury.  The wound protector was removed and all of this area was returned to the abdominal cavity in normal anatomical position.  The fascia was closed with running looped #1 PDS starting at either end and tying centrally.  Hemostasis was ensured in the wound and the skin was reapproximated with staples.  A new colostomy appliance was placed.  The patient was then awakened, extubated and taken to PACU in stable condition.    All counts were correct at the completion of the case.

## 2024-07-03 NOTE — Interval H&P Note (Signed)
 History and Physical Interval Note:  07/03/2024 7:07 AM  Vanessa Cisneros  has presented today for surgery, with the diagnosis of COLOSTOMY.  The various methods of treatment have been discussed with the patient and family. After consideration of risks, benefits and other options for treatment, the patient has consented to  Procedures with comments: CLOSURE, COLOSTOMY (N/A) - OPEN COLOSTOMY REVERSAL as a surgical intervention.  The patient's history has been reviewed, patient examined, no change in status, stable for surgery.  I have reviewed the patient's chart and labs.  Questions were answered to the patient's satisfaction.     Domenique Southers DELENA Freund

## 2024-07-03 NOTE — Transfer of Care (Signed)
 Immediate Anesthesia Transfer of Care Note  Patient: Vanessa Cisneros  Procedure(s) Performed: Procedures: RESECTION, RECTUM, LOW ANTERIOR, OPEN (N/A)  Patient Location: PACU  Anesthesia Type:General  Level of Consciousness: Patient easily awoken, comfortable, cooperative, following commands, responds to stimulation.   Airway & Oxygen Therapy: Patient spontaneously breathing, ventilating well, oxygen via simple oxygen mask.  Post-op Assessment: Report given to PACU RN, vital signs reviewed and stable, moving all extremities.   Post vital signs: Reviewed and stable.  Complications: No apparent anesthesia complications  Last Vitals:  Vitals Value Taken Time  BP 144/61 07/03/24 11:12  Temp    Pulse 98 07/03/24 11:14  Resp 16 07/03/24 11:14  SpO2 98 % 07/03/24 11:14  Vitals shown include unfiled device data.  Last Pain:  Vitals:   07/03/24 9366  TempSrc:   PainSc: 0-No pain         Complications: No notable events documented.

## 2024-07-03 NOTE — Anesthesia Procedure Notes (Signed)
 Procedure Name: Intubation Date/Time: 07/03/2024 7:37 AM  Performed by: Joshua Vernell BROCKS, CRNAPre-anesthesia Checklist: Patient identified, Emergency Drugs available, Suction available and Patient being monitored Patient Re-evaluated:Patient Re-evaluated prior to induction Oxygen Delivery Method: Circle system utilized Preoxygenation: Pre-oxygenation with 100% oxygen Induction Type: IV induction Ventilation: Mask ventilation without difficulty Laryngoscope Size: Mac and 3 Grade View: Grade I Tube type: Oral Tube size: 6.5 mm Number of attempts: 1 Placement Confirmation: ETT inserted through vocal cords under direct vision, positive ETCO2 and breath sounds checked- equal and bilateral Secured at: 20 cm Tube secured with: Tape Dental Injury: Teeth and Oropharynx as per pre-operative assessment

## 2024-07-03 NOTE — H&P (Signed)
 Vanessa Cisneros [id number]  DATE OF ENCOUNTER: 05/17/2024 Interval History:    She has continued to do well.  She reports her appetite is great, for her and her family who is with her she eats well.  She stays fairly active.  Her grandson who is with her states that she has maintained the same weight for about 15 years.  She lost a good bit after her husband passed away but then regained up to about 75 where she has maintained.  She is very eager to have the colostomy reversed.   03/01/24 colonoscopy (Dr. Aundria): - Non- patent hartmann' s pouch anastomosis, characterized by healthy appearing mucosa. - One 14 mm polyp in the cecum, removed with a hot snare. Resected and retrieved. Injected. - One 10 mm polyp in the proximal ascending colon, removed with a hot snare. Resected and retrieved. Clip ( MR conditional) was placed. Clip manufacturer: [employer]. - The examination was otherwise normal. Path: FINAL DIAGNOSIS        1. Cecum Polyp, X1; hot snare :       - TUBULAR ADENOMA.       - NEGATIVE FOR HIGH-GRADE DYSPLASIA AND MALIGNANCY.        2. Ascending  Colon Polyp, X1; hot snare :       - TUBULAR ADENOMA.       - NEGATIVE FOR HIGH-GRADE DYSPLASIA AND MALIGNANCY.    01/12/24: She has continued to do well.  She reports that she is eating well and her stoma is functioning.  Denies abdominal pain.  She is really wanting to have her colostomy reversed.  Not sure if her weight is stable.  Notes that she has been paying out-of-pocket for her colostomy supplies.   5/23: Very pleasant 78yo woman who had been suffering with partial obstruction for several months with associated weight loss due to diverticular stricture and presented with exacerbation and a small loculated pelvic abscess. She underwent hartman's and progressed very well after this, was discharged home on POD3. Unfortunately returned to the ER on POD 8 with recurrent abdominal pain and had a fluid collection on CT which was drained on  5/12 revealing hematoma- cultures negative. Discharged home again on POD 12. Drain was removed 5/22 with no fistula or residual fluid on CT drain study.  She reports she has been doing very well since then.  She has been eating well and reports she has gained 2 pounds.  She is here with her grandson and great-grandson who are her caregivers.   Physical Examination:       Vitals:    05/17/24 1531  Weight: (!) 34.2 kg (75 lb 6.4 oz)          Alert, well-appearing, weight is stable from last visit, up by 3 ounces Unlabored respirations Abdomen is soft, nontender.  Incision well-healed, no hernia or other complicating feature. Ostomy patent and functioning.    Path: FINAL MICROSCOPIC DIAGNOSIS:   A. COLON, DISTAL SIGMOID, COLECTOMY:  - Segment of colon (12 cm) with diverticulosis  - Benign reactive lymph node  - Margins appear viable    Assessment and Plan:    She is doing well.  She reports that she is eating well, her weight is stable.  We had a long discussion today about colostomy reversal and I went over the procedure with her in detail as well as anticipated perioperative course.  I reviewed risks including bleeding, infection, pain, scarring, injury to intra-abdominal or retroperitoneal structures, anastomotic leak/bleed/stricture and  potential need for return to the OR, risk of cardiovascular, pulmonary or thromboembolic complications, bowel obstruction or ileus, wound healing problems or hernia. I do think her risk of healing issues or anastomotic complications is increased due to her age and underweight status, but I do not think this is prohibitive and it does not seem that she is going to be able to gain weight.  She did remarkably well after her emergent surgery earlier this year.  She expressed understanding of all of this and wishes to proceed.   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

## 2024-07-04 ENCOUNTER — Encounter (HOSPITAL_COMMUNITY): Payer: Self-pay | Admitting: Surgery

## 2024-07-04 LAB — CBC
HCT: 45.8 % (ref 36.0–46.0)
Hemoglobin: 14.3 g/dL (ref 12.0–15.0)
MCH: 33.1 pg (ref 26.0–34.0)
MCHC: 31.2 g/dL (ref 30.0–36.0)
MCV: 106 fL — ABNORMAL HIGH (ref 80.0–100.0)
Platelets: 219 10*3/uL (ref 150–400)
RBC: 4.32 MIL/uL (ref 3.87–5.11)
RDW: 11.6 % (ref 11.5–15.5)
WBC: 15.6 10*3/uL — ABNORMAL HIGH (ref 4.0–10.5)
nRBC: 0 % (ref 0.0–0.2)

## 2024-07-04 LAB — BASIC METABOLIC PANEL WITH GFR
Anion gap: 16 — ABNORMAL HIGH (ref 5–15)
BUN: 28 mg/dL — ABNORMAL HIGH (ref 8–23)
CO2: 18 mmol/L — ABNORMAL LOW (ref 22–32)
Calcium: 8.1 mg/dL — ABNORMAL LOW (ref 8.9–10.3)
Chloride: 104 mmol/L (ref 98–111)
Creatinine, Ser: 1.53 mg/dL — ABNORMAL HIGH (ref 0.44–1.00)
GFR, Estimated: 34 mL/min — ABNORMAL LOW
Glucose, Bld: 176 mg/dL — ABNORMAL HIGH (ref 70–99)
Potassium: 4.6 mmol/L (ref 3.5–5.1)
Sodium: 139 mmol/L (ref 135–145)

## 2024-07-04 LAB — SURGICAL PATHOLOGY

## 2024-07-04 LAB — MAGNESIUM: Magnesium: 1 mg/dL — ABNORMAL LOW (ref 1.7–2.4)

## 2024-07-04 MED ORDER — MAGNESIUM SULFATE 4 GM/100ML IV SOLN
4.0000 g | Freq: Once | INTRAVENOUS | Status: AC
  Administered 2024-07-04: 4 g via INTRAVENOUS
  Filled 2024-07-04: qty 100

## 2024-07-04 MED ORDER — OXYCODONE HCL 5 MG PO TABS: 5.0000 mg | ORAL_TABLET | ORAL | Status: DC | PRN

## 2024-07-04 MED ORDER — METHOCARBAMOL 1000 MG/10ML IJ SOLN: 500.0000 mg | Freq: Four times a day (QID) | INTRAMUSCULAR | Status: DC | PRN

## 2024-07-04 MED ORDER — SODIUM CHLORIDE 0.9 % IV SOLN: INTRAVENOUS | Status: DC

## 2024-07-04 NOTE — Progress Notes (Signed)
 S: Feels woozy, slightly nauseated. Understandably disappointed.  O: Vitals, labs, intake/output, and orders reviewed at this time. Tm 98. HR 96-128, BP 139/46, sat 95% RA. UOP 550. PO 60. Co2 18, Cr 1.53 (1.25 preop), Mag 1.0. WBC 15.6 (4.8), hgb 14.3 (13.4), Plt 219 (251)  Prn meds: zofran  x 3, , dilaudid  x 4 Gen: A&Ox3, no distress  H&N: EOMI, atraumatic, neck supple Chest: unlabored respirations, RRR Abd: soft, appropriately tender L>R sides, mildly distended, incision c/d/i with honeycomb. Colostomy pink and viable, no output in bag Ext: warm, no edema Neuro: grossly normal  Lines/tubes/drains: PIV  A/P: POD 1 s/p exploratory laparotomy with extensive small bowel adhesiolysis, small bowel resection, aborted attempt at reversal of end colostomy -Increase IVF given increased Cr and low UOP, likely dry from prep + intra-op fluid losses. Recheck labs in AM -Clears until less nausea/ await return of bowel function. Ileus is anticipated -Replace Mag IV -Mobilize as able   Mitzie Freund, MD [facility], [place]

## 2024-07-04 NOTE — Plan of Care (Signed)
" °  Problem: Education: Goal: Understanding of discharge needs will improve Outcome: Progressing Goal: Verbalization of understanding of the causes of altered bowel function will improve Outcome: Progressing   Problem: Activity: Goal: Ability to tolerate increased activity will improve Outcome: Progressing   Problem: Bowel/Gastric: Goal: Gastrointestinal status for postoperative course will improve Outcome: Progressing   Problem: Health Behavior/Discharge Planning: Goal: Identification of community resources to assist with postoperative recovery needs will improve Outcome: Progressing   Problem: Nutritional: Goal: Will attain and maintain optimal nutritional status will improve Outcome: Progressing   Problem: Clinical Measurements: Goal: Postoperative complications will be avoided or minimized Outcome: Progressing   Problem: Respiratory: Goal: Respiratory status will improve Outcome: Progressing   Problem: Skin Integrity: Goal: Will show signs of wound healing Outcome: Progressing   Problem: Education: Goal: Knowledge of the prescribed therapeutic regimen will improve Outcome: Progressing   Problem: Bowel/Gastric: Goal: Gastrointestinal status for postoperative course will improve Outcome: Progressing   Problem: Cardiac: Goal: Ability to maintain an adequate cardiac output Outcome: Progressing Goal: Will show no evidence of cardiac arrhythmias Outcome: Progressing   Problem: Nutritional: Goal: Will attain and maintain optimal nutritional status Outcome: Progressing   Problem: Neurological: Goal: Will regain or maintain usual level of consciousness Outcome: Progressing   Problem: Clinical Measurements: Goal: Ability to maintain clinical measurements within normal limits Outcome: Progressing Goal: Postoperative complications will be avoided or minimized Outcome: Progressing   Problem: Respiratory: Goal: Will regain and/or maintain adequate  ventilation Outcome: Progressing Goal: Respiratory status will improve Outcome: Progressing   Problem: Skin Integrity: Goal: Demonstrates signs of wound healing without infection Outcome: Progressing   Problem: Urinary Elimination: Goal: Will remain free from infection Outcome: Progressing Goal: Ability to achieve and maintain adequate urine output Outcome: Progressing   Problem: Education: Goal: Knowledge of General Education information will improve Description: Including pain rating scale, medication(s)/side effects and non-pharmacologic comfort measures Outcome: Progressing   Problem: Health Behavior/Discharge Planning: Goal: Ability to manage health-related needs will improve Outcome: Progressing   Problem: Clinical Measurements: Goal: Ability to maintain clinical measurements within normal limits will improve Outcome: Progressing Goal: Will remain free from infection Outcome: Progressing Goal: Diagnostic test results will improve Outcome: Progressing Goal: Respiratory complications will improve Outcome: Progressing Goal: Cardiovascular complication will be avoided Outcome: Progressing   Problem: Activity: Goal: Risk for activity intolerance will decrease Outcome: Progressing   Problem: Nutrition: Goal: Adequate nutrition will be maintained Outcome: Progressing   Problem: Coping: Goal: Level of anxiety will decrease Outcome: Progressing   Problem: Elimination: Goal: Will not experience complications related to bowel motility Outcome: Progressing Goal: Will not experience complications related to urinary retention Outcome: Progressing   Problem: Pain Managment: Goal: General experience of comfort will improve and/or be controlled Outcome: Progressing   Problem: Safety: Goal: Ability to remain free from injury will improve Outcome: Progressing   Problem: Skin Integrity: Goal: Risk for impaired skin integrity will decrease Outcome: Progressing   "

## 2024-07-04 NOTE — Plan of Care (Signed)
" °  Problem: Education: Goal: Understanding of discharge needs will improve Outcome: Progressing   Problem: Activity: Goal: Ability to tolerate increased activity will improve Outcome: Progressing   Problem: Nutritional: Goal: Will attain and maintain optimal nutritional status Outcome: Progressing   "

## 2024-07-05 LAB — CBC
HCT: 25.4 % — ABNORMAL LOW (ref 36.0–46.0)
HCT: 24.9 % — ABNORMAL LOW (ref 36.0–46.0)
Hemoglobin: 8.6 g/dL — ABNORMAL LOW (ref 12.0–15.0)
Hemoglobin: 8.2 g/dL — ABNORMAL LOW (ref 12.0–15.0)
MCH: 34.5 pg — ABNORMAL HIGH (ref 26.0–34.0)
MCH: 34 pg (ref 26.0–34.0)
MCHC: 33.9 g/dL (ref 30.0–36.0)
MCHC: 32.9 g/dL (ref 30.0–36.0)
MCV: 102 fL — ABNORMAL HIGH (ref 80.0–100.0)
MCV: 103.3 fL — ABNORMAL HIGH (ref 80.0–100.0)
Platelets: 193 10*3/uL (ref 150–400)
Platelets: 172 10*3/uL (ref 150–400)
RBC: 2.49 MIL/uL — ABNORMAL LOW (ref 3.87–5.11)
RBC: 2.41 MIL/uL — ABNORMAL LOW (ref 3.87–5.11)
RDW: 11.9 % (ref 11.5–15.5)
RDW: 12 % (ref 11.5–15.5)
WBC: 12.1 10*3/uL — ABNORMAL HIGH (ref 4.0–10.5)
WBC: 10.6 10*3/uL — ABNORMAL HIGH (ref 4.0–10.5)
nRBC: 0 % (ref 0.0–0.2)
nRBC: 0 % (ref 0.0–0.2)

## 2024-07-05 LAB — MAGNESIUM: Magnesium: 2.3 mg/dL (ref 1.7–2.4)

## 2024-07-05 LAB — BASIC METABOLIC PANEL WITH GFR
Anion gap: 13 (ref 5–15)
BUN: 22 mg/dL (ref 8–23)
CO2: 19 mmol/L — ABNORMAL LOW (ref 22–32)
Calcium: 7.6 mg/dL — ABNORMAL LOW (ref 8.9–10.3)
Chloride: 107 mmol/L (ref 98–111)
Creatinine, Ser: 1.23 mg/dL — ABNORMAL HIGH (ref 0.44–1.00)
GFR, Estimated: 44 mL/min — ABNORMAL LOW
Glucose, Bld: 105 mg/dL — ABNORMAL HIGH (ref 70–99)
Potassium: 3.3 mmol/L — ABNORMAL LOW (ref 3.5–5.1)
Sodium: 139 mmol/L (ref 135–145)

## 2024-07-05 MED ORDER — POTASSIUM CHLORIDE 10 MEQ/100ML IV SOLN
10.0000 meq | INTRAVENOUS | Status: AC
  Administered 2024-07-05 (×4): 10 meq via INTRAVENOUS
  Filled 2024-07-05 (×2): qty 100

## 2024-07-05 NOTE — Progress Notes (Signed)
 Patients heart rate is 115. EKG obtained. MD on call notified. No new orders. Plan of care ongoing.

## 2024-07-05 NOTE — Progress Notes (Signed)
" °   07/05/24 1107  TOC Brief Assessment  Insurance and Status Reviewed  Patient has primary care physician Yes  Home environment has been reviewed lives with grandson and his wife; both working but very supportive  Prior level of function: independent  Forensic Psychologist No current home services  Social Drivers of Health Review SDOH reviewed no interventions necessary  Readmission risk has been reviewed Yes  Transition of care needs no transition of care needs at this time    "

## 2024-07-05 NOTE — Progress Notes (Signed)
 MEWS Progress Note  Patient Details Name: Vanessa Cisneros MRN: [id number] DOB: 11/08/1945 Today's Date: 07/05/2024   MEWS Flowsheet Documentation:  Assess: MEWS Score Temp: 98.4 F (36.9 C) BP: (!) 134/50 MAP (mmHg): 72 Pulse Rate: (!) 115 ECG Heart Rate: 100 Resp: 17 Level of Consciousness: Alert SpO2: 96 % O2 Device: Room Air Patient Activity (if Appropriate): In bed O2 Flow Rate (L/min): 2 L/min Assess: MEWS Score MEWS Temp: 0 MEWS Systolic: 0 MEWS Pulse: 2 MEWS RR: 0 MEWS LOC: 0 MEWS Score: 2 MEWS Score Color: Yellow Assess: SIRS CRITERIA SIRS Temperature : 0 SIRS Respirations : 0 SIRS Pulse: 1 SIRS WBC: 0 SIRS Score Sum : 1 SIRS Temperature : 0 SIRS Pulse: 1 SIRS Respirations : 0 SIRS WBC: 0 SIRS Score Sum : 1 Assess: if the MEWS score is Yellow or Red Were vital signs accurate and taken at a resting state?: Yes Does the patient meet 2 or more of the SIRS criteria?: No MEWS guidelines implemented : Yes, yellow Treat MEWS Interventions: Considered administering scheduled or prn medications/treatments as ordered Take Vital Signs Increase Vital Sign Frequency : Yellow: Q2hr x1, continue Q4hrs until patient remains green for 12hrs Escalate MEWS: Escalate: Yellow: Discuss with charge nurse and consider notifying provider and/or RRT Notify: Charge Nurse/RN Name of Charge Nurse/RN Notified: Carla,RN Provider Notification Provider Name/Title: Dr. DELENA Lehmann ([facility]) Date Provider Notified: 07/05/24 Time Provider Notified: 0049 Method of Notification: Page Notification Reason: Change in status (yellow mews due to tachycardia) Provider response: Evaluate remotely, No new orders Date of Provider Response: 07/05/24 Time of Provider Response:  (9949) Notify: Rapid Response Name of Rapid Response RN Notified: not needed      Georgina Moats B 07/05/2024, 1:19 AM

## 2024-07-05 NOTE — Progress Notes (Signed)
 S: Nausea is better. No emesis. Having abdominal pain. No ostomy output. Has been up to chair.   O: Vitals, labs, intake/output, and orders reviewed at this time. Tm 100. HR 108-110, BP 138/54, sat 96% RA. UOP 2250 yesterday; 1150 so far today (800 erroneously recorded as blood). PO 840 yesterday, 612 so far today.. K 3.3, cr improved to 1.23 (baseline)1, Mag 2.3. WBC down to 12.1, , hgb 8.6 (14.3), Plt 193 (219)  Gen: A&Ox3, no distress  H&N: EOMI, atraumatic, neck supple Chest: unlabored respirations, RRR Abd: soft, appropriately tender L>R sides, mildly distended, incision c/d/i with honeycomb. Colostomy pink and viable, no output in bag Ext: warm, no edema Neuro: grossly normal  Lines/tubes/drains: PIV  A/P: POD 2 s/p exploratory laparotomy with extensive small bowel adhesiolysis, small bowel resection, aborted attempt at reversal of end colostomy -Fluid status better. Hgb significantly dropped but stable on recheck this PM, no concern for active bleeding. Will recheck labs tomorrow.  -Full liquids, await return of bowel function. Ileus is anticipated -Replace potassium IV for hypokalemia -Mobilize as able   Mitzie Freund, MD [facility], [place]

## 2024-07-05 NOTE — Plan of Care (Signed)
  Problem: Education: Goal: Understanding of discharge needs will improve Outcome: Progressing Goal: Verbalization of understanding of the causes of altered bowel function will improve Outcome: Progressing   Problem: Activity: Goal: Ability to tolerate increased activity will improve Outcome: Progressing   Problem: Bowel/Gastric: Goal: Gastrointestinal status for postoperative course will improve Outcome: Progressing

## 2024-07-06 LAB — BASIC METABOLIC PANEL WITH GFR
Anion gap: 17 — ABNORMAL HIGH (ref 5–15)
BUN: 13 mg/dL (ref 8–23)
CO2: 16 mmol/L — ABNORMAL LOW (ref 22–32)
Calcium: 8.2 mg/dL — ABNORMAL LOW (ref 8.9–10.3)
Chloride: 107 mmol/L (ref 98–111)
Creatinine, Ser: 0.76 mg/dL (ref 0.44–1.00)
GFR, Estimated: >60 mL/min
Glucose, Bld: 58 mg/dL — ABNORMAL LOW (ref 70–99)
Potassium: 3.9 mmol/L (ref 3.5–5.1)
Sodium: 139 mmol/L (ref 135–145)

## 2024-07-06 LAB — CBC
HCT: 27 % — ABNORMAL LOW (ref 36.0–46.0)
Hemoglobin: 8.7 g/dL — ABNORMAL LOW (ref 12.0–15.0)
MCH: 33.7 pg (ref 26.0–34.0)
MCHC: 32.2 g/dL (ref 30.0–36.0)
MCV: 104.7 fL — ABNORMAL HIGH (ref 80.0–100.0)
Platelets: 211 10*3/uL (ref 150–400)
RBC: 2.58 MIL/uL — ABNORMAL LOW (ref 3.87–5.11)
RDW: 12.2 % (ref 11.5–15.5)
WBC: 9.7 10*3/uL (ref 4.0–10.5)
nRBC: 0 % (ref 0.0–0.2)

## 2024-07-06 LAB — MAGNESIUM: Magnesium: 2.3 mg/dL (ref 1.7–2.4)

## 2024-07-06 NOTE — Plan of Care (Signed)
" °  Problem: Education: Goal: Understanding of discharge needs will improve Outcome: Progressing   Problem: Activity: Goal: Ability to tolerate increased activity will improve Outcome: Progressing   Problem: Bowel/Gastric: Goal: Gastrointestinal status for postoperative course will improve Outcome: Progressing   Problem: Nutritional: Goal: Will attain and maintain optimal nutritional status Outcome: Progressing   "

## 2024-07-06 NOTE — Progress Notes (Addendum)
 S: Feels fairly well. Didn't sleep great but tolerating full liquids without nausea and pain continues to improve.   O: Vitals, labs, intake/output, and orders reviewed at this time. Tm 97.9. HR 86-90, BP 148/71, sat 97% RA. UOP 3000 yesterday; 300 so far today . PO 1452 yesterday, 300 so far today.. Cr improved to 0.76, Mag 2.3. WBC down to 9.7, hgb 8.7 (stable since yesterday morning), Plt 211  Gen: A&Ox3, no distress  H&N: EOMI, atraumatic, neck supple Chest: unlabored respirations, RRR Abd: soft, appropriately tender/ less than yesterday, more distended than yesterday, incision c/d/i with honeycomb. Colostomy pink and viable, liquid stool in bag Ext: warm, no edema Neuro: grossly normal  Lines/tubes/drains: PIV  A/P: POD 3 s/p exploratory laparotomy with extensive small bowel adhesiolysis, small bowel resection, aborted attempt at reversal of end colostomy -Advance to soft diet, distended but having bowel function -Stop IV fluids -Mobilize as able,   If tolerating a diet and less distended, possible DC home tomorrow. Will see how today goes.    Mitzie Freund, MD [facility], [place]

## 2024-07-06 NOTE — Plan of Care (Signed)
" °  Problem: Neurological: Goal: Will regain or maintain usual level of consciousness Outcome: Progressing   "

## 2024-07-07 ENCOUNTER — Other Ambulatory Visit (HOSPITAL_COMMUNITY): Payer: Self-pay

## 2024-07-07 LAB — GLUCOSE, CAPILLARY: Glucose-Capillary: 138 mg/dL — ABNORMAL HIGH (ref 70–99)

## 2024-07-07 MED ORDER — OXYCODONE HCL 5 MG PO TABS
5.0000 mg | ORAL_TABLET | Freq: Three times a day (TID) | ORAL | 0 refills | Status: AC | PRN
  Filled 2024-07-07: qty 10, 4d supply, fill #0

## 2024-07-07 MED ORDER — OXYCODONE HCL 5 MG PO TABS: 5.0000 mg | ORAL_TABLET | Freq: Three times a day (TID) | ORAL | Status: DC | PRN

## 2024-07-07 MED ORDER — ONDANSETRON 4 MG PO TBDP
4.0000 mg | ORAL_TABLET | Freq: Three times a day (TID) | ORAL | 0 refills | Status: AC | PRN
  Filled 2024-07-07: qty 20, 7d supply, fill #0

## 2024-07-07 NOTE — Discharge Instructions (Signed)

## 2024-07-07 NOTE — Progress Notes (Signed)
 S: Feels well, no complaints this AM. Tolerating diet without nausea or pain. Has not taken any PRN meds.   O: Vitals, labs, intake/output, and orders reviewed at this time. Tm 98.5. HR 90, BP 148/68, sat 98% RA. UOP 1000; PO 1260.   Gen: A&Ox3, no distress  H&N: EOMI, atraumatic, neck supple Chest: unlabored respirations, RRR Abd: soft, appropriately tender, moreso on the right side, about the same as yesterday, slightly less distended than yesterday, incision c/d/i with honeycomb. Colostomy pale pink and viable, liquid stool in bag about 150cc Ext: warm, no edema Neuro: grossly normal  Lines/tubes/drains: PIV  A/P: POD 4 s/p exploratory laparotomy with extensive small bowel adhesiolysis, small bowel resection, aborted attempt at reversal of end colostomy -Continue soft diet, distended but having bowel function -Mobilize as able,   Anticipate DC home this afternoon if continues to do well.   Mitzie Freund, MD [facility], [place]

## 2024-07-07 NOTE — Discharge Summary (Signed)
 Physician Discharge Summary  Patient ID: Vanessa Cisneros MRN: [id number] DOB/AGE: 12/04/1945 79 y.o.  Admit date: 07/03/2024 Discharge date: 07/07/2024  Admission Diagnoses: colostomy  Discharge Diagnoses:  Principal Problem:   S/P small bowel resection   Discharged Condition: good  Hospital Course: She was admitted for planned reversal of end colostomy on 07/03/2024.  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  Colostomy reversal was aborted. Postop day 1-nausea, mild acute kidney injury with increased creatinine and low urine output likely from prep and IntraOp fluid losses Postop day 2-fluid status improved, significant drop in hemoglobin from 14-8, stable on recheck afternoon labs Postop day 3-creatinine normalized and hemoglobin remained stable.  Mild distention but having bowel function and tolerating a diet Postop day 4-continues to feel well with no nausea or pain and tolerating diet.  Having some ostomy output.  Stable for discharge home.   Discharge Exam: Blood pressure (!) 148/68, pulse 90, temperature 98.2 F (36.8 C), temperature source Oral, resp. rate 17, height 4' 11 (1.499 m), weight 40.5 kg, SpO2 98%. See rounding note  Disposition: Discharge disposition: 01-Home or Self Care        Allergies as of 07/07/2024       Reactions   Levofloxacin Other (See Comments)   Patient stated that it makes her extremely sore, and she can hardly move.    Tramadol Nausea Only        Medication List     TAKE these medications    acetaminophen  500 MG tablet Commonly known as: TYLENOL  Take 2 tablets (1,000 mg total) by mouth every 8 (eight) hours as needed for mild pain (pain score 1-3).   CALCIUM  600 + D PO Take 1 tablet by mouth 2 (two) times daily.   donepezil  5 MG tablet Commonly known as: ARICEPT  Take 5 mg by mouth at bedtime.   feeding supplement Liqd Take 237 mLs by mouth 2  (two) times daily between meals.   fish oil-omega-3 fatty acids  1000 MG capsule Take 1 capsule (1 g total) by mouth daily.   fluticasone 50 MCG/ACT nasal spray Commonly known as: FLONASE Place 2 sprays into both nostrils daily as needed for allergies.   hydroxychloroquine 200 MG tablet Commonly known as: PLAQUENIL Take 200 mg by mouth daily.   lovastatin 40 MG tablet Commonly known as: MEVACOR Take 40 mg by mouth at bedtime.   mirtazapine  7.5 MG tablet Commonly known as: REMERON  Take 7.5 mg by mouth at bedtime.   omeprazole 40 MG capsule Commonly known as: PRILOSEC Take 40 mg by mouth in the morning and at bedtime.   ondansetron  4 MG disintegrating tablet Commonly known as: ZOFRAN -ODT Take 1 tablet (4 mg total) by mouth every 8 (eight) hours as needed.   oxyCODONE  5 MG immediate release tablet Commonly known as: Roxicodone  Take 1 tablet (5 mg total) by mouth every 8 (eight) hours as needed for up to 5 days. Alternate tylenol  and ibuprofen  for the first few days. Take narcotic pain medication only if needed for severe/ breakthrough pain.   sucralfate  1 g tablet Commonly known as: CARAFATE  Take 1 g by mouth 2 (two) times daily.   torsemide  20 MG tablet Commonly known as: DEMADEX  Take 10 mg by mouth in the morning.   Tylenol  PM Extra Strength 500-25 MG Tabs tablet Generic drug: diphenhydramine -acetaminophen  Take 2 tablets by mouth at bedtime.   venlafaxine  XR 37.5 MG 24 hr capsule Commonly known as: EFFEXOR -XR Take 37.5 mg  by mouth daily with breakfast.   Vitron-C 65-125 MG Tabs Generic drug: Iron -Vitamin C Take 1 tablet by mouth daily with breakfast.        Follow-up Information     Signe Mitzie LABOR, MD Follow up on 07/14/2024.   Specialty: General Surgery Why: Your appointment is at 10:30AM on 2/6 for staple removal. If you need to reschedule this appointment, please call as soon as possible. Contact information: [address] [place]  [place] [address] [phone]                 Signed: Mitzie LABOR Signe 07/07/2024, 11:11 AM

## 2024-07-07 NOTE — Progress Notes (Signed)
 Mobility Specialist - Progress Note:   07/07/24 1128  Mobility  Activity Ambulated with assistance  Level of Assistance Contact guard assist, steadying assist  Assistive Device  (HHA)  Distance Ambulated (ft) 20 ft  Activity Response Tolerated well  Mobility Referral Yes  Mobility visit 1 Mobility  Mobility Specialist Start Time (ACUTE ONLY) 1035  Mobility Specialist Stop Time (ACUTE ONLY) 1045  Mobility Specialist Time Calculation (min) (ACUTE ONLY) 10 min   Pt was received in bed and requested to use restroom. No complaints/issues during visit. Returned to recliner with all needs met. Call bell in reach.  [employer] - Mobility Specialist - Acute Rehabilitation Can be reached via [employer]

## 2024-07-08 ENCOUNTER — Other Ambulatory Visit (HOSPITAL_COMMUNITY): Payer: Self-pay

## 2024-07-10 ENCOUNTER — Other Ambulatory Visit (HOSPITAL_COMMUNITY): Payer: Self-pay

## 2024-07-11 ENCOUNTER — Other Ambulatory Visit (HOSPITAL_COMMUNITY): Payer: Self-pay

## 2024-07-31 ENCOUNTER — Inpatient Hospital Stay

## 2024-08-01 ENCOUNTER — Inpatient Hospital Stay

## 2024-08-01 ENCOUNTER — Inpatient Hospital Stay: Admitting: Oncology
